# Patient Record
Sex: Male | Born: 1955
Health system: Southern US, Community
[De-identification: ages and names within clinical notes are randomized; demographics above are authoritative.]

## PROBLEM LIST (undated history)

## (undated) DIAGNOSIS — I509 Heart failure, unspecified: Secondary | ICD-10-CM

## (undated) DIAGNOSIS — M109 Gout, unspecified: Secondary | ICD-10-CM

---

## 1998-08-28 ENCOUNTER — Ambulatory Visit (HOSPITAL_BASED_OUTPATIENT_CLINIC_OR_DEPARTMENT_OTHER): Admission: RE | Admit: 1998-08-28 | Discharge: 1998-08-28 | Payer: Self-pay | Admitting: *Deleted

## 2020-01-23 ENCOUNTER — Inpatient Hospital Stay (HOSPITAL_COMMUNITY): Payer: Self-pay

## 2020-01-23 ENCOUNTER — Encounter (HOSPITAL_COMMUNITY): Payer: Self-pay | Admitting: *Deleted

## 2020-01-23 ENCOUNTER — Inpatient Hospital Stay (HOSPITAL_COMMUNITY)
Admission: EM | Admit: 2020-01-23 | Discharge: 2020-02-08 | DRG: 233 | Disposition: A | Discharge status: Home Health | Payer: Self-pay | Attending: Thoracic Surgery (Cardiothoracic Vascular Surgery) | Admitting: Thoracic Surgery (Cardiothoracic Vascular Surgery)

## 2020-01-23 ENCOUNTER — Emergency Department (HOSPITAL_COMMUNITY): Payer: Self-pay

## 2020-01-23 ENCOUNTER — Other Ambulatory Visit: Payer: Self-pay

## 2020-01-23 DIAGNOSIS — L03116 Cellulitis of left lower limb: Secondary | ICD-10-CM | POA: Diagnosis present

## 2020-01-23 DIAGNOSIS — D62 Acute posthemorrhagic anemia: Secondary | ICD-10-CM | POA: Diagnosis not present

## 2020-01-23 DIAGNOSIS — E871 Hypo-osmolality and hyponatremia: Secondary | ICD-10-CM | POA: Diagnosis not present

## 2020-01-23 DIAGNOSIS — J90 Pleural effusion, not elsewhere classified: Secondary | ICD-10-CM

## 2020-01-23 DIAGNOSIS — I5023 Acute on chronic systolic (congestive) heart failure: Secondary | ICD-10-CM

## 2020-01-23 DIAGNOSIS — Z833 Family history of diabetes mellitus: Secondary | ICD-10-CM

## 2020-01-23 DIAGNOSIS — E669 Obesity, unspecified: Secondary | ICD-10-CM | POA: Diagnosis present

## 2020-01-23 DIAGNOSIS — I4891 Unspecified atrial fibrillation: Secondary | ICD-10-CM | POA: Diagnosis not present

## 2020-01-23 DIAGNOSIS — I255 Ischemic cardiomyopathy: Secondary | ICD-10-CM | POA: Diagnosis present

## 2020-01-23 DIAGNOSIS — I4892 Unspecified atrial flutter: Secondary | ICD-10-CM | POA: Diagnosis not present

## 2020-01-23 DIAGNOSIS — Z951 Presence of aortocoronary bypass graft: Secondary | ICD-10-CM

## 2020-01-23 DIAGNOSIS — I2510 Atherosclerotic heart disease of native coronary artery without angina pectoris: Secondary | ICD-10-CM | POA: Diagnosis present

## 2020-01-23 DIAGNOSIS — I472 Ventricular tachycardia: Secondary | ICD-10-CM | POA: Diagnosis not present

## 2020-01-23 DIAGNOSIS — R609 Edema, unspecified: Secondary | ICD-10-CM

## 2020-01-23 DIAGNOSIS — I509 Heart failure, unspecified: Secondary | ICD-10-CM

## 2020-01-23 DIAGNOSIS — I2582 Chronic total occlusion of coronary artery: Secondary | ICD-10-CM | POA: Diagnosis present

## 2020-01-23 DIAGNOSIS — I878 Other specified disorders of veins: Secondary | ICD-10-CM | POA: Diagnosis present

## 2020-01-23 DIAGNOSIS — I110 Hypertensive heart disease with heart failure: Principal | ICD-10-CM | POA: Diagnosis present

## 2020-01-23 DIAGNOSIS — Z6833 Body mass index (BMI) 33.0-33.9, adult: Secondary | ICD-10-CM

## 2020-01-23 DIAGNOSIS — Z09 Encounter for follow-up examination after completed treatment for conditions other than malignant neoplasm: Secondary | ICD-10-CM

## 2020-01-23 DIAGNOSIS — Z95828 Presence of other vascular implants and grafts: Secondary | ICD-10-CM

## 2020-01-23 DIAGNOSIS — E875 Hyperkalemia: Secondary | ICD-10-CM | POA: Diagnosis not present

## 2020-01-23 DIAGNOSIS — R319 Hematuria, unspecified: Secondary | ICD-10-CM | POA: Diagnosis not present

## 2020-01-23 DIAGNOSIS — Z20822 Contact with and (suspected) exposure to covid-19: Secondary | ICD-10-CM | POA: Diagnosis present

## 2020-01-23 DIAGNOSIS — I5021 Acute systolic (congestive) heart failure: Secondary | ICD-10-CM | POA: Diagnosis present

## 2020-01-23 DIAGNOSIS — L03115 Cellulitis of right lower limb: Secondary | ICD-10-CM | POA: Diagnosis present

## 2020-01-23 HISTORY — DX: Gout, unspecified: M10.9

## 2020-01-23 LAB — LIPID PANEL
Cholesterol: 116 mg/dL (ref 0–200)
HDL: 36 mg/dL — ABNORMAL LOW (ref >40)
LDL Cholesterol: 69 mg/dL (ref 0–99)
Total CHOL/HDL Ratio: 3.2 RATIO
Triglycerides: 56 mg/dL (ref <150)
VLDL: 11 mg/dL (ref 0–40)

## 2020-01-23 LAB — CBC
HCT: 44.9 % (ref 39.0–52.0)
Hemoglobin: 14.4 g/dL (ref 13.0–17.0)
MCH: 29.5 pg (ref 26.0–34.0)
MCHC: 32.1 g/dL (ref 30.0–36.0)
MCV: 92 fL (ref 80.0–100.0)
Platelets: 225 10*3/uL (ref 150–400)
RBC: 4.88 MIL/uL (ref 4.22–5.81)
RDW: 16.3 % — ABNORMAL HIGH (ref 11.5–15.5)
WBC: 7.5 10*3/uL (ref 4.0–10.5)
nRBC: 0 % (ref 0.0–0.2)

## 2020-01-23 LAB — HEMOGLOBIN A1C
Hgb A1c MFr Bld: 6.3 % — ABNORMAL HIGH (ref 4.8–5.6)
Mean Plasma Glucose: 134.11 mg/dL

## 2020-01-23 LAB — HEPATIC FUNCTION PANEL
ALT: 15 U/L (ref 0–44)
AST: 31 U/L (ref 15–41)
Albumin: 3.8 g/dL (ref 3.5–5.0)
Alkaline Phosphatase: 99 U/L (ref 38–126)
Bilirubin, Direct: 0.5 mg/dL — ABNORMAL HIGH (ref 0.0–0.2)
Indirect Bilirubin: 0.9 mg/dL (ref 0.3–0.9)
Total Bilirubin: 1.4 mg/dL — ABNORMAL HIGH (ref 0.3–1.2)
Total Protein: 7.8 g/dL (ref 6.5–8.1)

## 2020-01-23 LAB — BASIC METABOLIC PANEL
Anion gap: 10 (ref 5–15)
BUN: 16 mg/dL (ref 8–23)
CO2: 23 mmol/L (ref 22–32)
Calcium: 9.1 mg/dL (ref 8.9–10.3)
Chloride: 102 mmol/L (ref 98–111)
Creatinine, Ser: 1.18 mg/dL (ref 0.61–1.24)
GFR calc Af Amer: >60 mL/min (ref >60)
GFR calc non Af Amer: >60 mL/min (ref >60)
Glucose, Bld: 98 mg/dL (ref 70–99)
Potassium: 4.9 mmol/L (ref 3.5–5.1)
Sodium: 135 mmol/L (ref 135–145)

## 2020-01-23 LAB — TROPONIN I (HIGH SENSITIVITY)
Troponin I (High Sensitivity): 32 ng/L — ABNORMAL HIGH (ref <18)
Troponin I (High Sensitivity): 31 ng/L — ABNORMAL HIGH (ref <18)

## 2020-01-23 LAB — RESPIRATORY PANEL BY RT PCR (FLU A&B, COVID)
Influenza A by PCR: NEGATIVE
Influenza B by PCR: NEGATIVE
SARS Coronavirus 2 by RT PCR: NEGATIVE

## 2020-01-23 LAB — BRAIN NATRIURETIC PEPTIDE: B Natriuretic Peptide: 1370.5 pg/mL — ABNORMAL HIGH (ref 0.0–100.0)

## 2020-01-23 LAB — ECHOCARDIOGRAM COMPLETE

## 2020-01-23 LAB — HIV ANTIBODY (ROUTINE TESTING W REFLEX): HIV Screen 4th Generation wRfx: NONREACTIVE

## 2020-01-23 LAB — SARS CORONAVIRUS 2 (TAT 6-24 HRS): SARS Coronavirus 2: NEGATIVE

## 2020-01-23 LAB — TSH: TSH: 2.785 u[IU]/mL (ref 0.350–4.500)

## 2020-01-23 MED ORDER — FUROSEMIDE 10 MG/ML IJ SOLN
40.0000 mg | Freq: Once | INTRAMUSCULAR | Status: AC
  Administered 2020-01-23: 40 mg via INTRAVENOUS
  Filled 2020-01-23: qty 4

## 2020-01-23 MED ORDER — POLYETHYLENE GLYCOL 3350 17 G PO PACK
17.0000 g | PACK | Freq: Every day | ORAL | Status: DC | PRN
  Administered 2020-01-26: 17 g via ORAL
  Filled 2020-01-23: qty 1

## 2020-01-23 MED ORDER — ACETAMINOPHEN 650 MG RE SUPP: 650.0000 mg | Freq: Four times a day (QID) | RECTAL | Status: DC | PRN

## 2020-01-23 MED ORDER — LOSARTAN POTASSIUM 25 MG PO TABS
12.5000 mg | ORAL_TABLET | Freq: Every day | ORAL | Status: DC
  Filled 2020-01-23: qty 0.5

## 2020-01-23 MED ORDER — ONDANSETRON HCL 4 MG PO TABS: 4.0000 mg | ORAL_TABLET | Freq: Four times a day (QID) | ORAL | Status: DC | PRN

## 2020-01-23 MED ORDER — FUROSEMIDE 10 MG/ML IJ SOLN
40.0000 mg | Freq: Two times a day (BID) | INTRAMUSCULAR | Status: DC
  Administered 2020-01-23 – 2020-01-25 (×5): 40 mg via INTRAVENOUS
  Filled 2020-01-23 (×5): qty 4

## 2020-01-23 MED ORDER — CEPHALEXIN 250 MG PO CAPS
500.0000 mg | ORAL_CAPSULE | Freq: Once | ORAL | Status: AC
  Administered 2020-01-23: 500 mg via ORAL
  Filled 2020-01-23: qty 2

## 2020-01-23 MED ORDER — SPIRONOLACTONE 12.5 MG HALF TABLET
12.5000 mg | ORAL_TABLET | Freq: Every day | ORAL | Status: DC
  Administered 2020-01-23: 12.5 mg via ORAL
  Filled 2020-01-23: qty 1

## 2020-01-23 MED ORDER — CEPHALEXIN 250 MG PO CAPS
500.0000 mg | ORAL_CAPSULE | Freq: Four times a day (QID) | ORAL | Status: DC
  Administered 2020-01-23 – 2020-01-24 (×3): 500 mg via ORAL
  Filled 2020-01-23 (×3): qty 2

## 2020-01-23 MED ORDER — ACETAMINOPHEN 325 MG PO TABS
650.0000 mg | ORAL_TABLET | Freq: Four times a day (QID) | ORAL | Status: DC | PRN
  Administered 2020-01-24: 650 mg via ORAL
  Filled 2020-01-23: qty 2

## 2020-01-23 MED ORDER — DIGOXIN 125 MCG PO TABS
0.1250 mg | ORAL_TABLET | Freq: Every day | ORAL | Status: DC
  Administered 2020-01-23 – 2020-01-29 (×7): 0.125 mg via ORAL
  Filled 2020-01-23 (×7): qty 1

## 2020-01-23 MED ORDER — ENOXAPARIN SODIUM 40 MG/0.4ML ~~LOC~~ SOLN
40.0000 mg | SUBCUTANEOUS | Status: DC
  Administered 2020-01-23 – 2020-01-25 (×3): 40 mg via SUBCUTANEOUS
  Filled 2020-01-23 (×3): qty 0.4

## 2020-01-23 MED ORDER — ONDANSETRON HCL 4 MG/2ML IJ SOLN: 4.0000 mg | Freq: Four times a day (QID) | INTRAMUSCULAR | Status: DC | PRN

## 2020-01-23 NOTE — ED Notes (Signed)
Tele  Amritpal Shropshire brother [phone] looking for an update on pt

## 2020-01-23 NOTE — H&P (Addendum)
Advanced Heart Failure Team History and Physical Note   PCP: None PCP-Cardiology: None    Reason for Admission: Acute CHF    HPI:   Corey Brady is a 64 y.o male who has not seen a doctor since childhood presenting to the ED with progressive BLE of 2-3 months duration. The swelling initially started in his ankles and improved with elevation; however, over the last 2-3 months has progressed up his legs to his lower abdomen. In addition to the swelling he has noticed progressive fatigue and DOE. He is unable to walk more than a block because of pain in his feet (related to the swelling) and shortness of breath. He endorses abdominal distention, early satiety, and orthopnea. As mentioned he has not seen a doctor his he was a child. He has been told his BP was high at the dentist. He does not have a family history of any medical conditions. He does not smoke. He previously drank 2-3 12oz beers per day but has not done so in several months. He does not use recreational drugs. He previously worked at a [employer] but has not worked in several years. He now collects social security. He did have some exertional chest pain a year or two ago but has not had any recently.  Review of Systems: [y] = yes, [ ]  = no   . General: Weight gain [Y]; Weight loss [ ] ; Anorexia [ ] ; Fatigue [Y]; Fever [ ] ; Chills [ ] ; Weakness [ ]   . Cardiac: Chest pain/pressure [ ] ; Resting SOB [ ] ; Exertional SOB [Y]; Orthopnea [Y]; Pedal Edema [Y]; Palpitations [ ] ; Syncope [ ] ; Presyncope [ ] ; Paroxysmal nocturnal dyspnea[ ]   . Pulmonary: Cough [ ] ; Wheezing[ ] ; Hemoptysis[ ] ; Sputum [ ] ; Snoring [ ]   . GI: Vomiting[ ] ; Dysphagia[ ] ; Melena[ ] ; Hematochezia [ ] ; Heartburn[ ] ; Abdominal pain [ ] ; Constipation [ ] ; Diarrhea [ ] ; BRBPR [ ]   . GU: Hematuria[ ] ; Dysuria [ ] ; Nocturia[ ]   . Vascular: Pain in legs with walking [Y]; Pain in feet with lying flat [ ] ; Non-healing sores [ ] ; Stroke [ ] ; TIA [ ] ; Slurred speech [ ] ;   . Neuro: Headaches[ ] ; Vertigo[ ] ; Seizures[ ] ; Paresthesias[ ] ;Blurred vision [ ] ; Diplopia [ ] ; Vision changes [ ]   . Ortho/Skin: Arthritis [ ] ; Joint pain [ ] ; Muscle pain [ ] ; Joint swelling [ ] ; Back Pain [ ] ; Rash [ ]   . Psych: Depression[ ] ; Anxiety[ ]   . Heme: Bleeding problems [ ] ; Clotting disorders [ ] ; Anemia [ ]   . Endocrine: Diabetes [ ] ; Thyroid dysfunction[ ]   Home Medications Denies the use of any prescription or OTC medications.   Past Medical History: Denies any past medical history including HTN, DM, CAD.   Past Surgical History: Denies any past surgical history.   Family History:  Mother: + DM Denies a family history of CAD, CVA, PAD, or HF.   Social History: He does not smoke. He previously drank 2-3 12oz beers per day but has not done so in several months. He does not use recreational drugs. He previously worked at a but has not worked in several years. He now collects social security. Patient currently lives at home with his brother.    Allergies:  Denies any allergies   Objective:    Blood pressure (!) 139/100, pulse (!) 113, temperature 97.6 F (36.4 C), resp. rate 18, SpO2 100 %.  General: Obese male in no  acute distress HENT: Normocephalic, atraumatic, moist mucus membranes Pulm: Good air movement with bibasilar crackles and diminished breath sounds at the right base.  CV: Tachycardic but regular rhythm, PMI displaced laterally, + JVD  Abdomen: Active bowel sounds, firm, distended, pitting edema around the waist  Extremities: Pulses palpable in all extremities, 2-3+ pitting edema to the hips, multiple weeping wounds on the BLE Skin: Warm and dry  Neuro: Alert and oriented x 3  POCUS: Moderately reduced LVEF with dilated right atrium. IVC is > 2 cm and has no respiratory variation.   Telemetry   Sinus tachycardia with occasional PVC  EKG   NSR with LVH and TWI in the anterior leads   Labs     Basic Metabolic Panel: BMP  Latest Ref Rng & Units 01/23/2020  Glucose 70 - 99 mg/dL 98  BUN 8 - 23 mg/dL 16  Creatinine 0.61 - 1.24 mg/dL 1.18  Sodium 135 - 145 mmol/L 135  Potassium 3.5 - 5.1 mmol/L 4.9  Chloride 98 - 111 mmol/L 102  CO2 22 - 32 mmol/L 23  Calcium 8.9 - 10.3 mg/dL 9.1    CBC: CBC Latest Ref Rng & Units 01/23/2020  WBC 4.0 - 10.5 K/uL 7.5  Hemoglobin 13.0 - 17.0 g/dL 14.4  Hematocrit 39.0 - 52.0 % 44.9  Platelets 150 - 400 K/uL 225   BNP: BNP (last 3 results) Recent Labs    01/23/20 0338  BNP 1,370.5*   Imaging:  CXR personally reviewed. Pulmonary edema with right pleural effusion    Patient Profile   Corey Brady is a 64 y.o male who has not seen a doctor since childhood presenting to the ED with signs and symptoms of volume overload and new heart failure. He was subsequently admitted for further evaluation and management.   Assessment/Plan   Acute Heart Failure  - Progressive DOE and LE edema of 2-3 months duration. With pitting edema to the waist, + JVD, elevated BNP and pulmonary edema on CXR  - Continue to diurese with 40 mg IV furosemide BID  - Start Spironolactone 12.5 mg QD  - Obtain formal echocardiogram.   - Check lipid panel and A1c  - Will need ischemic evaluation once closer to euvolemia  - Strict I&O's and daily weights  - Heart healthy diet with fluid restriction  - Continue telemetry for now - Care management consult to assist in medications and insurance   Cellulitis  - Wounds on the LE are due to edema. Some erythema and weeping but otherwise no abscesses appreciated.  - Okay to continue Keflex as inpatient but ultimately needs diureses   Diet: Heart healthy  VTE ppx: Lovenox  CODE STATUS: Full code  Please see attending attestation for final recommendations.   Ina Homes, MD  [facility] PGY3  Pager: [phone]   Advanced Heart Failure Team Pager [phone] (M-F; 7a - 4p)  Please contact [facility] for night-coverage after hours (4p -7a ) and  weekends on [email or web address]  Patient seen and examined with the above-signed Advanced Practice Provider and/or Housestaff. I personally reviewed laboratory data, imaging studies and relevant notes. I independently examined the patient and formulated the important aspects of the plan. I have edited the note to reflect any of my changes or salient points. I have personally discussed the plan with the patient and/or family.  64 y/o male with h/o HTN and moderate ETOH use but no other known significant PMHx. Lives with his brother. Reports that over past 2-3 months he began swelling.  He took some pills from his sister and it got better. However ran out of pills and over past month has had severe, progressive LE edema into thighs. + DOE. Now with limited mobility  In ER found to have anasarca. ECG with sinus tach with LVH. Hstrop negative.    Echo LVEF 15% with severe global HK  Moderate RV dysfunction   On exam General:  Chronically-ill appearing. No resp difficulty HEENT: normal Neck: supple.JVP to ear  Carotids 2+ bilat; no bruits. No lymphadenopathy or thryomegaly appreciated. Cor: PMI nondisplaced. Regular tachy +s3 Lungs: clear Abdomen: soft, nontender, ++ distended. No hepatosplenomegaly. No bruits or masses. Good bowel sounds. Extremities: no cyanosis, clubbing, rash,3-4+ edema with weeping lesions and probable cellulitis Neuro: alert & orientedx3, cranial nerves grossly intact. moves all 4 extremities w/o difficulty. Affect pleasant  He has acute systolic HF with severe biventricular dysfunction and massive volume overload. I suspect this is related to longstanding untreated HTN. Will start diuresis. Add spiro and dig and low-dose losartan. Will need wound care. Cover for cellulitis. Eventual R/L cath.   Will need SW to see as he has no insurance.   Arvilla Meres, MD  6:06 PM

## 2020-01-23 NOTE — Progress Notes (Signed)
  Echocardiogram 2D Echocardiogram has been performed.  Corey Brady 01/23/2020, 4:48 PM

## 2020-01-23 NOTE — Progress Notes (Signed)
[facility] Program Questions  Home support: Does the patient have a caregiver? Lives with his brother.   Is the caregiver physically and cognitively able to and willing to assist in the patient's care at home? Yes  Are there aspects of care that the caregiver is unable or willing to assist with?   Yes   Prior Functional Level: Has there been a change from their baseline level of function with ADLs and IADLs?  Yes  If yes, describe the change in functional status.   Legs swollen making it a little harder to ambulate  Does the change in functional status limit the patient from returning home for their care? No  Falls:   Has the patient had any falls with injury in the last 6 months?   No  If yes, how many and what was the cause of the fall(s)? n/a  Transportation: Does the patient drive?  Yes  Is a caregiver able to pick up needed supplies (groceries, medications, etc.)? Yes; pt reports most of the time his brother picks up groceries.   If both answers above are "no" has the patient been using delivery services to get their needed supplies? N/a    How does the patient get to medical appointments?  He drives himself or his brother takes him.   *Not currently active with any HHPT and HHOT.     Farley Ly, PT, DPT  Acute Rehabilitation Services  Pager: [phone] Office: [phone]

## 2020-01-23 NOTE — ED Notes (Signed)
Report given to 6 east.

## 2020-01-23 NOTE — ED Triage Notes (Signed)
Pt reports ongoing bilateral ankle and foot swelling that has increased up into his legs. No acute distress is noted at triage.

## 2020-01-23 NOTE — ED Provider Notes (Signed)
[facility]EMERGENCY DEPT [facility] Emergency Department Provider Note MRN:  [id number]  Arrival date & time: 01/23/20     Chief Complaint   Leg Swelling   History of Present Illness   Corey Brady is a 64 y.o. year-old male with a history of gout presenting to the ED with chief complaint of leg swelling.  Months of slowly worsening swelling to the bilateral legs.  Has never had a primary care doctor before.  Does not take any medications.  Has noted some occasional shortness of breath when laying flat at night.  Denies any chest pain, no fever, no cough, no abdominal pain.  Review of Systems  A complete 10 system review of systems was obtained and all systems are negative except as noted in the HPI and PMH.   Patient's Health History    Past Medical History:  Diagnosis Date  . Gout     History reviewed. No pertinent surgical history.  History reviewed. No pertinent family history.  Social History   Socioeconomic History  . Marital status: Single    Spouse name: Not on file  . Number of children: Not on file  . Years of education: Not on file  . Highest education level: Not on file  Occupational History  . Not on file  Tobacco Use  . Smoking status: Never Smoker  Substance and Sexual Activity  . Alcohol use: Yes  . Drug use: Not on file  . Sexual activity: Not on file  Other Topics Concern  . Not on file  Social History Narrative  . Not on file   Social Determinants of Health   Financial Resource Strain:   . Difficulty of Paying Living Expenses:   Food Insecurity:   . Worried About Programme researcher, broadcasting/film/video in the Last Year:   . Barista in the Last Year:   Transportation Needs:   . Freight forwarder (Medical):   Marland Kitchen Lack of Transportation (Non-Medical):   Physical Activity:   . Days of Exercise per Week:   . Minutes of Exercise per Session:   Stress:   . Feeling of Stress :   Social Connections:   . Frequency of Communication with Friends and Family:    . Frequency of Social Gatherings with Friends and Family:   . Attends Religious Services:   . Active Member of Clubs or Organizations:   . Attends Banker Meetings:   Marland Kitchen Marital Status:   Intimate Partner Violence:   . Fear of Current or Ex-Partner:   . Emotionally Abused:   Marland Kitchen Physically Abused:   . Sexually Abused:      Physical Exam   Vitals:   01/23/20 1200 01/23/20 1300  BP: (!) 133/91 (!) 133/91  Pulse: 96 97  Resp:  (!) 21  Temp:    SpO2: 99% 99%    CONSTITUTIONAL: Chronically ill-appearing, NAD NEURO:  Alert and oriented x 3, no focal deficits EYES:  eyes equal and reactive ENT/NECK:  no LAD, no JVD CARDIO: Tachycardic rate, well-perfused, normal S1 and S2 PULM:  CTAB no wheezing or rhonchi GI/GU:  normal bowel sounds, non-distended, non-tender MSK/SPINE:  No gross deformities, no edema SKIN: Pitting edema to the bilateral legs to the level of the thigh, chronic erythematous and flaky skin changes bilaterally, bright red erythema noted to the right tib-fib with malodorous nature PSYCH:  Appropriate speech and behavior  *Additional and/or pertinent findings included in MDM below  Diagnostic and Interventional Summary  EKG Interpretation  Date/Time:  Tuesday January 23 2020 12:22:18 EDT Ventricular Rate:  100 PR Interval:    QRS Duration: 110 QT Interval:  370 QTC Calculation: 478 R Axis:   -12 Text Interpretation: Sinus tachycardia LVH with secondary repolarization abnormality Borderline prolonged QT interval No previous ECGs available Confirmed by Gerlene Fee [phone]) on 01/23/2020 1:04:37 PM      Labs Reviewed  CBC - Abnormal; Notable for the following components:      Result Value   RDW 16.3 (*)    All other components within normal limits  BRAIN NATRIURETIC PEPTIDE - Abnormal; Notable for the following components:   B Natriuretic Peptide 1,370.5 (*)    All other components within normal limits  HEPATIC FUNCTION PANEL - Abnormal;  Notable for the following components:   Total Bilirubin 1.4 (*)    Bilirubin, Direct 0.5 (*)    All other components within normal limits  TROPONIN I (HIGH SENSITIVITY) - Abnormal; Notable for the following components:   Troponin I (High Sensitivity) 32 (*)    All other components within normal limits  BASIC METABOLIC PANEL  TROPONIN I (HIGH SENSITIVITY)    DG Chest Port 1 View  Final Result      Medications  cephALEXin (KEFLEX) capsule 500 mg (500 mg Oral Given 01/23/20 1023)  furosemide (LASIX) injection 40 mg (40 mg Intravenous Given 01/23/20 1404)     Procedures  /  Critical Care Procedures  ED Course and Medical Decision Making  I have reviewed the triage vital signs, the nursing notes, and pertinent available records from the EMR.  Listed above are laboratory and imaging tests that I personally ordered, reviewed, and interpreted and then considered in my medical decision making (see below for details).      Chronically worsening lower extremity edema, unclear etiology, considering cardiogenic, hepatic, renal, venous.  Other than mild tachycardia patient is in no respiratory distress with reassuring vital signs, will screen with x-ray, laboratory assessment.  I suspect a large component of his lower extremity skin changes are chronic and related to the edema but there does appear to be a more bright patch of erythema concerning for cellulitis, I do not see fluctuance or signs of abscess or deeper space infection.  Work-up is pending.  Work-up reveals elevated BNP, raising concern for CHF as the cause of his swelling.  Patient was evaluated by Dr. Tarri Abernethy of cardiology for the possibility of home management of this CHF exacerbation.  This is somewhat difficult given that patient has had no diagnostics related to heart failure in the past and has no follow-up established.  He did have some good response to Lasix here in the emergency department.  Upon further questioning patient had  some intermittent chest pain prior to the fluid retention.  Patient is to be admitted to the cardiology service for further management and possible catheterization.  Barth Kirks. [place] Small, [place] [email or web address]  Final Clinical Impressions(s) / ED Diagnoses     ICD-10-CM   1. Peripheral edema  R60.9   2. Congestive heart failure, unspecified HF chronicity, unspecified heart failure type ([place])  I50.9     ED Discharge Orders    None       Discharge Instructions Discussed with and Provided to Patient:   Discharge Instructions   None       Maudie Flakes, MD 01/23/20 1458

## 2020-01-23 NOTE — Evaluation (Signed)
Physical Therapy Evaluation Patient Details Name: Corey Brady MRN: [id number] DOB: 01-07-1956 Today's Date: 01/23/2020   History of Present Illness  Pt is a 64 y/o male admitted secondary to Increased swelling in BLE; likely from CHF exacerbation. PMH includes CHF and gout.   Clinical Impression  Pt admitted secondary to problem above with deficits below. Pt requiring min guard to supervision for mobility tasks. Reports some tightness in BLE, however, did not seem to affect mobility. Educated about using RW when having heel pain vs crutches, however, pt refusing RW at this time. Plan is to return home with his brother. Will continue to follow acutely.     Follow Up Recommendations No PT follow up    Equipment Recommendations  None recommended by PT(pt refusing RW )    Recommendations for Other Services       Precautions / Restrictions Precautions Precautions: Fall Restrictions Weight Bearing Restrictions: No      Mobility  Bed Mobility Overal bed mobility: Needs Assistance Bed Mobility: Sit to Supine;Supine to Sit     Supine to sit: Min guard Sit to supine: Supervision   General bed mobility comments: Supervision to min guard for safety during bed mobility.   Transfers Overall transfer level: Needs assistance Equipment used: None Transfers: Sit to/from Stand Sit to Stand: Supervision         General transfer comment: Supervision for safety.   Ambulation/Gait Ambulation/Gait assistance: Supervision Gait Distance (Feet): 20 Feet Assistive device: None Gait Pattern/deviations: Step-through pattern;Decreased stride length Gait velocity: Decreased   General Gait Details: Waddle type gait within the room in the ED. No LOB noted. Pt reports he is close to his baseline, other than tightness in BLE.   Stairs            Wheelchair Mobility    Modified Rankin (Stroke Patients Only)       Balance Overall balance assessment: Mild deficits observed, not  formally tested                                           Pertinent Vitals/Pain Pain Assessment: Faces Faces Pain Scale: Hurts a little bit Pain Location: BLE Pain Descriptors / Indicators: Grimacing;Guarding Pain Intervention(s): Monitored during session;Limited activity within patient's tolerance;Repositioned    Home Living Family/patient expects to be discharged to:: Private residence Living Arrangements: Other relatives(brother) Available Help at Discharge: Family;Available PRN/intermittently Type of Home: House Home Access: Stairs to enter Entrance Stairs-Rails: Doctor, general practice of Steps: 6 Home Layout: One level Home Equipment: Crutches;Cane - single point Additional Comments: Brother works 2nd shift     Prior Function Level of Independence: Independent with assistive device(s)         Comments: Sometimes use crutches     Hand Dominance        Extremity/Trunk Assessment   Upper Extremity Assessment Upper Extremity Assessment: Overall WFL for tasks assessed    Lower Extremity Assessment Lower Extremity Assessment: RLE deficits/detail;LLE deficits/detail RLE Deficits / Details: Increased swelling, redness, and blistering noted in BLE.  LLE Deficits / Details: Increased swelling, redness, and blistering noted in BLE.     Cervical / Trunk Assessment Cervical / Trunk Assessment: Normal  Communication   Communication: No difficulties  Cognition Arousal/Alertness: Awake/alert Behavior During Therapy: WFL for tasks assessed/performed Overall Cognitive Status: Within Functional Limits for tasks assessed  General Comments General comments (skin integrity, edema, etc.): Pt reports he sometimes has to use crutches for heel pain. Educated about RW for increased safety with heel pain, however, pt refusing at this time.     Exercises     Assessment/Plan    PT Assessment  Patient needs continued PT services  PT Problem List Decreased strength;Decreased mobility;Cardiopulmonary status limiting activity       PT Treatment Interventions Gait training;Stair training;Functional mobility training;Therapeutic activities;Therapeutic exercise;Balance training;Patient/family education    PT Goals (Current goals can be found in the Care Plan section)  Acute Rehab PT Goals Patient Stated Goal: to go home PT Goal Formulation: With patient Time For Goal Achievement: 02/06/20 Potential to Achieve Goals: Good    Frequency Min 3X/week   Barriers to discharge        Co-evaluation               AM-PAC PT "6 Clicks" Mobility  Outcome Measure Help needed turning from your back to your side while in a flat bed without using bedrails?: None Help needed moving from lying on your back to sitting on the side of a flat bed without using bedrails?: A Little Help needed moving to and from a bed to a chair (including a wheelchair)?: None Help needed standing up from a chair using your arms (e.g., wheelchair or bedside chair)?: None Help needed to walk in hospital room?: None Help needed climbing 3-5 steps with a railing? : A Little 6 Click Score: 22    End of Session   Activity Tolerance: Patient tolerated treatment well Patient left: in bed;with call bell/phone within reach Nurse Communication: Mobility status PT Visit Diagnosis: Other abnormalities of gait and mobility (R26.89)    Time: 3888-2800 PT Time Calculation (min) (ACUTE ONLY): 14 min   Charges:   PT Evaluation $PT Eval Low Complexity: 1 Low          Lou Miner, DPT  Acute Rehabilitation Services  Pager: [phone] Office: [phone]   Rudean Hitt 01/23/2020, 2:59 PM

## 2020-01-24 LAB — CBC
HCT: 44.3 % (ref 39.0–52.0)
Hemoglobin: 15 g/dL (ref 13.0–17.0)
MCH: 30.2 pg (ref 26.0–34.0)
MCHC: 33.9 g/dL (ref 30.0–36.0)
MCV: 89.1 fL (ref 80.0–100.0)
Platelets: 164 10*3/uL (ref 150–400)
RBC: 4.97 MIL/uL (ref 4.22–5.81)
RDW: 15.9 % — ABNORMAL HIGH (ref 11.5–15.5)
WBC: 6 10*3/uL (ref 4.0–10.5)
nRBC: 0 % (ref 0.0–0.2)

## 2020-01-24 LAB — BASIC METABOLIC PANEL
Anion gap: 11 (ref 5–15)
BUN: 15 mg/dL (ref 8–23)
CO2: 23 mmol/L (ref 22–32)
Calcium: 8.9 mg/dL (ref 8.9–10.3)
Chloride: 101 mmol/L (ref 98–111)
Creatinine, Ser: 0.91 mg/dL (ref 0.61–1.24)
GFR calc Af Amer: >60 mL/min (ref >60)
GFR calc non Af Amer: >60 mL/min (ref >60)
Glucose, Bld: 98 mg/dL (ref 70–99)
Potassium: 4.5 mmol/L (ref 3.5–5.1)
Sodium: 135 mmol/L (ref 135–145)

## 2020-01-24 MED ORDER — SODIUM CHLORIDE 0.9% FLUSH
3.0000 mL | Freq: Two times a day (BID) | INTRAVENOUS | Status: DC
  Administered 2020-01-24 – 2020-01-29 (×7): 3 mL via INTRAVENOUS

## 2020-01-24 MED ORDER — SPIRONOLACTONE 25 MG PO TABS
25.0000 mg | ORAL_TABLET | Freq: Every day | ORAL | Status: DC
  Administered 2020-01-24 – 2020-01-26 (×3): 25 mg via ORAL
  Filled 2020-01-24 (×4): qty 1

## 2020-01-24 MED ORDER — VANCOMYCIN HCL 2000 MG/400ML IV SOLN
2000.0000 mg | INTRAVENOUS | Status: DC
  Administered 2020-01-24 – 2020-01-28 (×5): 2000 mg via INTRAVENOUS
  Filled 2020-01-24 (×5): qty 400

## 2020-01-24 MED ORDER — SODIUM CHLORIDE 0.9 % IV SOLN
INTRAVENOUS | Status: DC | PRN
  Administered 2020-01-24: 250 mL via INTRAVENOUS

## 2020-01-24 MED ORDER — LOSARTAN POTASSIUM 25 MG PO TABS
25.0000 mg | ORAL_TABLET | Freq: Every day | ORAL | Status: DC
  Administered 2020-01-24 – 2020-01-29 (×6): 25 mg via ORAL
  Filled 2020-01-24 (×6): qty 1

## 2020-01-24 NOTE — Progress Notes (Signed)
To the best of my knowledge, the student's charting is accurate.  

## 2020-01-24 NOTE — Progress Notes (Addendum)
Advanced Heart Failure Rounding Note  PCP-Cardiologist: No primary care provider on file.   Subjective:     Yesterday diuresed with IV lasix. Brisk diuresis noted.   Feeling a little better. Complaining of leg pain.    Objective:   Weight Range: 112.5 kg Body mass index is 33.65 kg/m.   Vital Signs:   Temp:  [97.6 F (36.4 C)-98.5 F (36.9 C)] 98.5 F (36.9 C) (04/28 0728) Pulse Rate:  [96-113] 96 (04/28 0728) Resp:  [18-21] 20 (04/28 0728) BP: (109-139)/(60-100) 110/60 (04/28 0728) SpO2:  [95 %-100 %] 96 % (04/28 0728) Weight:  [112.5 kg-114.3 kg] 112.5 kg (04/28 0321)    Weight change: Filed Weights   01/23/20 2018 01/24/20 0321  Weight: 114.3 kg 112.5 kg    Intake/Output:   Intake/Output Summary (Last 24 hours) at 01/24/2020 0937 Last data filed at 01/24/2020 0800 Gross per 24 hour  Intake 590 ml  Output 6200 ml  Net -5610 ml      Physical Exam    General:   No resp difficulty HEENT: Normal Neck: Supple. JVP to jaw . Carotids 2+ bilat; no bruits. No lymphadenopathy or thyromegaly appreciated. Cor: PMI nondisplaced. Regular rate & rhythm. No rubs, gallops or murmurs. Lungs: Clear Abdomen: Soft, nontender, nondistended. No hepatosplenomegaly. No bruits or masses. Good bowel sounds. Extremities: No cyanosis, clubbing, rash, R and LLE erythema with copious serous exudate. Able to palpate R and L pedal pulses.  Neuro: Alert & orientedx3, cranial nerves grossly intact. moves all 4 extremities w/o difficulty. Affect pleasant   Telemetry  SR/ST 90-100s   EKG    n/a  Labs    CBC Recent Labs    01/23/20 0338 01/24/20 0711  WBC 7.5 6.0  HGB 14.4 15.0  HCT 44.9 44.3  MCV 92.0 89.1  PLT 225 401   Basic Metabolic Panel Recent Labs    01/23/20 0338 01/24/20 0711  NA 135 135  K 4.9 4.5  CL 102 101  CO2 23 23  GLUCOSE 98 98  BUN 16 15  CREATININE 1.18 0.91  CALCIUM 9.1 8.9   Liver Function Tests Recent Labs    01/23/20 0338  AST  31  ALT 15  ALKPHOS 99  BILITOT 1.4*  PROT 7.8  ALBUMIN 3.8   No results for input(s): LIPASE, AMYLASE in the last 72 hours. Cardiac Enzymes No results for input(s): CKTOTAL, CKMB, CKMBINDEX, TROPONINI in the last 72 hours.  BNP: BNP (last 3 results) Recent Labs    01/23/20 0338  BNP 1,370.5*    ProBNP (last 3 results) No results for input(s): PROBNP in the last 8760 hours.   D-Dimer No results for input(s): DDIMER in the last 72 hours. Hemoglobin A1C Recent Labs    01/23/20 1700  HGBA1C 6.3*   Fasting Lipid Panel Recent Labs    01/23/20 1700  CHOL 116  HDL 36*  LDLCALC 69  TRIG 56  CHOLHDL 3.2   Thyroid Function Tests Recent Labs    01/23/20 1700  TSH 2.785    Other results:   Imaging    DG Chest Port 1 View  Result Date: 01/23/2020 CLINICAL DATA:  64 year old male with lower extremity edema. Denies chest complaints. EXAM: PORTABLE CHEST 1 VIEW COMPARISON:  None. FINDINGS: Portable AP upright view at 1014 hours. Cardiomegaly. Other mediastinal contours are within normal limits. Visualized tracheal air column is within normal limits. Confluent opacity at the right lung base compatible with small to moderate pleural effusion. No left  pleural effusion. Pulmonary vascularity is at the upper limits of normal, no overt edema. No pneumothorax or air bronchograms. Paucity of bowel gas in the upper abdomen. No acute osseous abnormality identified. IMPRESSION: Cardiomegaly with right lung base opacity compatible with small to moderate pleural effusion. No overt edema. Electronically Signed   By: Odessa Fleming M.D.   On: 01/23/2020 10:24   ECHOCARDIOGRAM COMPLETE  Result Date: 01/23/2020    ECHOCARDIOGRAM REPORT   Patient Name:   Corey Brady Date of Exam: 01/23/2020 Medical Rec #:  [id number]  Height:       72.0 in Accession #:    [id number] Weight:       268.0 lb Date of Birth:  10-15-1955 BSA:          2.413 m Patient Age:    63 years   BP:           133/91 mmHg Patient  Gender: M          HR:           99 bpm. Exam Location:  Inpatient Procedure: 2D Echo, Cardiac Doppler and Color Doppler Indications:    I50.23 Acute on chronic systolic (congestive) heart failure  History:        Patient has no prior history of Echocardiogram examinations.  Sonographer:    Tiffany Dance Referring Phys: [id number] Elmer Sow BERO IMPRESSIONS  1. Severe global reduction in LV systolic function; restrictive filling; mild LVE; biatrial enlargement; moderate RV dysfunction; mild MR.  2. Left ventricular ejection fraction, by estimation, is <20%. The left ventricle has severely decreased function. The left ventricle demonstrates global hypokinesis. The left ventricular internal cavity size was mildly dilated. Left ventricular diastolic parameters are consistent with Grade III diastolic dysfunction (restrictive).  3. Right ventricular systolic function is moderately reduced. The right ventricular size is normal. There is moderately elevated pulmonary artery systolic pressure.  4. Left atrial size was severely dilated.  5. Right atrial size was mildly dilated.  6. The mitral valve is normal in structure. Mild mitral valve regurgitation. No evidence of mitral stenosis.  7. The aortic valve is tricuspid. Aortic valve regurgitation is not visualized. Mild aortic valve sclerosis is present, with no evidence of aortic valve stenosis.  8. The inferior vena cava is dilated in size with <50% respiratory variability, suggesting right atrial pressure of 15 mmHg. FINDINGS  Left Ventricle: Left ventricular ejection fraction, by estimation, is <20%. The left ventricle has severely decreased function. The left ventricle demonstrates global hypokinesis. The left ventricular internal cavity size was mildly dilated. There is no  left ventricular hypertrophy. Left ventricular diastolic parameters are consistent with Grade III diastolic dysfunction (restrictive). Right Ventricle: The right ventricular size is normal.Right  ventricular systolic function is moderately reduced. There is moderately elevated pulmonary artery systolic pressure. The tricuspid regurgitant velocity is 3.03 m/s, and with an assumed right atrial pressure of 15 mmHg, the estimated right ventricular systolic pressure is 51.7 mmHg. Left Atrium: Left atrial size was severely dilated. Right Atrium: Right atrial size was mildly dilated. Pericardium: Trivial pericardial effusion is present. Mitral Valve: The mitral valve is normal in structure. Normal mobility of the mitral valve leaflets. Mild mitral valve regurgitation. No evidence of mitral valve stenosis. Tricuspid Valve: The tricuspid valve is normal in structure. Tricuspid valve regurgitation is mild . No evidence of tricuspid stenosis. Aortic Valve: The aortic valve is tricuspid. Aortic valve regurgitation is not visualized. Mild aortic valve sclerosis is present, with no evidence of aortic  valve stenosis. Pulmonic Valve: The pulmonic valve was normal in structure. Pulmonic valve regurgitation is trivial. No evidence of pulmonic stenosis. Aorta: The aortic root is normal in size and structure. Venous: The inferior vena cava is dilated in size with less than 50% respiratory variability, suggesting right atrial pressure of 15 mmHg. IAS/Shunts: There is left bowing of the interatrial septum, suggestive of elevated right atrial pressure. No atrial level shunt detected by color flow Doppler. Additional Comments: Severe global reduction in LV systolic function; restrictive filling; mild LVE; biatrial enlargement; moderate RV dysfunction; mild MR.  LEFT VENTRICLE PLAX 2D LVIDd:         5.47 cm  Diastology LVIDs:         4.76 cm  LV e' lateral:   3.65 cm/s LV PW:         1.34 cm  LV E/e' lateral: 24.2 LV IVS:        1.07 cm  LV e' medial:    2.68 cm/s LVOT diam:     2.40 cm  LV E/e' medial:  32.9 LV SV:         52 LV SV Index:   22 LVOT Area:     4.52 cm  RIGHT VENTRICLE            IVC RV Basal diam:  3.73 cm    IVC  diam: 2.74 cm RV Mid diam:    2.88 cm RV S prime:     6.41 cm/s TAPSE (M-mode): 1.3 cm LEFT ATRIUM              Index       RIGHT ATRIUM           Index LA diam:        5.20 cm  2.16 cm/m  RA Area:     24.00 cm LA Vol (A2C):   150.0 ml 62.17 ml/m RA Volume:   81.10 ml  33.62 ml/m LA Vol (A4C):   105.0 ml 43.52 ml/m LA Biplane Vol: 127.0 ml 52.64 ml/m  AORTIC VALVE LVOT Vmax:   70.20 cm/s LVOT Vmean:  43.600 cm/s LVOT VTI:    0.116 m  AORTA Ao Root diam: 3.80 cm Ao Asc diam:  3.60 cm MITRAL VALVE               TRICUSPID VALVE MV Area (PHT): 5.25 cm    TR Peak grad:   36.7 mmHg MV Decel Time: 145 msec    TR Vmax:        303.00 cm/s MV E velocity: 88.25 cm/s                            SHUNTS                            Systemic VTI:  0.12 m                            Systemic Diam: 2.40 cm Olga Millers MD Electronically signed by Olga Millers MD Signature Date/Time: 01/23/2020/4:54:36 PM    Final       Medications:     Scheduled Medications: . digoxin  0.125 mg Oral Daily  . enoxaparin (LOVENOX) injection  40 mg Subcutaneous Q24H  . furosemide  40 mg Intravenous BID  . losartan  25 mg Oral Daily  . spironolactone  25 mg Oral Daily     Infusions: . vancomycin       PRN Medications:  acetaminophen **OR** acetaminophen, ondansetron **OR** ondansetron (ZOFRAN) IV, polyethylene glycol     Assessment/Plan   1. Acute Systolic Heart Failure ECHO EF 15% with severe global HK, and moderate RV dysfunction. ? Etiology possible ETOH versus HTN. TSH was ok. HIV NR  - Brisk diuresis noted with IV lasix. Will continue 40 mg IV lasix twice a day for now. Renal function stable.  - Continue spiro 25 mg daily - Continue digoxin 0.125 mg daily  - Increased losartan 25 mg daily eventually will need entresto  - Once diuresed will need RHC/LHC   2. Cellulits , R and LLE legs Partial Thickness wound with palpable pedal pulses.  WBC ok.  Started keflex 4/27-->Switch to vancomycin today - WOC  consult.   3. ? H/O ETOH Abuse  Social - He has not insurance so we will need to provide 30 days of HF meds at discharge. Will notiffy HF Pharmacy and HF SW for med assist. Suspect he will need HF Paramedicine.   He will also need to be set up with PCP.   Length of Stay: 1  Amy Clegg, NP  01/24/2020, 9:37 AM  Advanced Heart Failure Team Pager [phone] (M-F; 7a - 4p)  Please contact [facility] for night-coverage after hours (4p -7a ) and weekends on [email or web address]  Patient seen and examined with the above-signed Advanced Practice Provider and/or Housestaff. I personally reviewed laboratory data, imaging studies and relevant notes. I independently examined the patient and formulated the important aspects of the plan. I have edited the note to reflect any of my changes or salient points. I have personally discussed the plan with the patient and/or family.  Diuresed well overnight. Dyspnea better buts till weak. Legs continue to weep. No f/c. HR slightly improved. BP toelrating diuresis and HF meds. Renal function stable.   General:  Chronically ill appearing. No resp difficulty HEENT: normal Neck: supple. JVP 10-11 with prominent v wavesCarotids 2+ bilat; no bruits. No lymphadenopathy or thryomegaly appreciated. Cor: PMI nondisplaced. Regular rate & rhythm. 2/6 TR +s3 Lungs: clear Abdomen: soft, nontender, less distended. No hepatosplenomegaly. No bruits or masses. Good bowel sounds. Extremities: no cyanosis, clubbing, rash, 3+ edema diffuse erythema and weeping wounds Neuro: alert & orientedx3, cranial nerves grossly intact. moves all 4 extremities w/o difficulty. Affect pleasant  Continue IV diuresis and titration of HF meds. Discussed dosing with PharmD personally. Appreciate wound care input. Will use gentle wraps of legs. Distal pulses ok. Suspect this is mostly venous stasis. Will give IV vanc for a few days for possible overlying cellulitis. Plan R/L cath likely Friday.   Arvilla Meres, MD  10:58 AM

## 2020-01-24 NOTE — Progress Notes (Signed)
   01/24/20 1510 01/24/20 1535 01/24/20 1537  Assess: MEWS Score  Temp 98.8 F (37.1 C) 98 F (36.7 C) 98 F (36.7 C)  BP 109/65 103/67 103/67  Pulse Rate (!) 111 (documenting error at first) (!) 106 (!) 107  Resp 19 18 18   Level of Consciousness Alert Alert  --   SpO2 98 % 97 % 96 %  O2 Device Room Air Room Air Room Air  Assess: MEWS Score  MEWS Temp 0 0 0  MEWS Systolic 0 0 0  MEWS Pulse 2 1 1   MEWS RR 0 0 0  MEWS LOC 0 0 0  MEWS Score 2 1 1   MEWS Score Color Yellow Green Green  Assess: if the MEWS score is Yellow or Red  Were vital signs taken at a resting state? Yes  --   --   Focused Assessment Documented focused assessment  --   --   Early Detection of Sepsis Score *See Row Information* Low  --   --   MEWS guidelines implemented *See Row Information* No, vital signs rechecked  --   --    Apical pulse 106.pt denies chest pain or shortness of breath.

## 2020-01-24 NOTE — Progress Notes (Signed)
Pharmacy Antibiotic Note  Corey Brady is a 64 y.o. male admitted on 01/23/2020 with cellulitis.  Pharmacy has been consulted for vancomycin dosing.  Pt has bilateral red cellulitis appearance on legs. WBC wnl. Afebrile. Started on Keflex on 4/27. Scr 0.9 (norm CrCl 85).   Plan: Start vancomycin 2g IV q24h (estimated AUC 443 using SCr 0.9)  Monitor Scr, culture results, physical appearance.   Height: 6' (182.9 cm) Weight: 112.5 kg (248 lb 1.6 oz) IBW/kg (Calculated) : 77.6  Temp (24hrs), Avg:97.8 F (36.6 C), Min:97.6 F (36.4 C), Max:98.5 F (36.9 C)  Recent Labs  Lab 01/23/20 0338 01/24/20 0711  WBC 7.5 6.0  CREATININE 1.18 0.91    Estimated Creatinine Clearance: 107.6 mL/min (by C-G formula based on SCr of 0.91 mg/dL).    No Known Allergies  Antimicrobials this admission: Keflex 4/27 >> 4/28 Vancomycin 4/28 >>   Dose adjustments this admission: N/a   Microbiology results: 4/27 COVID PCR neg   Thank you for allowing pharmacy to be a part of this patient's care.   Budd Palmer  PharmD candidate

## 2020-01-24 NOTE — Consult Note (Signed)
WOC Nurse Consult Note: Patient receiving care in [facility] 3E29. Reason for Consult: BLE wounds Wound type: related to excess fluid from feet to abdomen Pressure Injury POA: Yes/No/NA Measurement: RLE has a superficial wound measuring 3.3 cm x 3 cm.  The LLE posterior has a wound that measures 11 cm x 3 cm and another above it and below the posterior knee that measures 3 cm x 3 cm.  All are superficial and legs are weeping yellow fluid. Wound bed: all are pink Drainage (amount, consistency, odor) yellow, thin Periwound: crusted, flaking Dressing procedure/placement/frequency:  Gently wash BLE with soap and water, pat dry. Place as many Xeroform gauze [place] [phone]) over the LE wounds as necessary to cover. If weeping, also place ABD pads.  Beginning just behind the toes and going to just below the knees, spiral wrap kerlex and 4 inch Ace Wraps.  Change daily and prn soilage. If an ABI reveals adequate pressures to support the use of compression wraps, this can be considered.  Currently there are no results to review.  Also, the patient has never used any type of compression on the legs before, so we are unsure if his currently fluid overload condition will tolerate a true compression wrap to the legs. Monitor the wound area(s) for worsening of condition such as: Signs/symptoms of infection,  Increase in size,  Development of or worsening of odor, Development of pain, or increased pain at the affected locations.  Notify the medical team if any of these develop.  Thank you for the consult.  Discussed plan of care with the patient and bedside nurse.  WOC nurse will not follow at this time.  Please re-consult the WOC team if needed.  Helmut Muster, RN, MSN, CWOCN, CNS-BC, pager [phone]

## 2020-01-24 NOTE — Progress Notes (Addendum)
   01/24/20 1200 01/24/20 1224  Assess: MEWS Score  Temp 98.3 F (36.8 C) 98 F (36.7 C)  BP 100/68 101/72  Pulse Rate (!) 106 (!) 108  Resp 20 18  SpO2 97 % 96 %  O2 Device Room Air Room Air  Assess: MEWS Score  MEWS Temp 0 0  MEWS Systolic 1 0  MEWS Pulse 1 1  MEWS RR 0 0  MEWS LOC 0 0  MEWS Score 2 1  MEWS Score Color Yellow Green  Assess: if the MEWS score is Yellow or Red  Were vital signs taken at a resting state? Yes Yes  Focused Assessment Documented focused assessment Documented focused assessment  Early Detection of Sepsis Score *See Row Information* Low Low  MEWS guidelines implemented *See Row Information* No, vital signs rechecked No, vital signs rechecked   Apical pulse 108. Pt denies shortness of breath or chest pain.

## 2020-01-25 LAB — MAGNESIUM: Magnesium: 1.8 mg/dL (ref 1.7–2.4)

## 2020-01-25 LAB — CBC
HCT: 42.2 % (ref 39.0–52.0)
Hemoglobin: 14 g/dL (ref 13.0–17.0)
MCH: 29.8 pg (ref 26.0–34.0)
MCHC: 33.2 g/dL (ref 30.0–36.0)
MCV: 89.8 fL (ref 80.0–100.0)
Platelets: 193 10*3/uL (ref 150–400)
RBC: 4.7 MIL/uL (ref 4.22–5.81)
RDW: 15.9 % — ABNORMAL HIGH (ref 11.5–15.5)
WBC: 6.2 10*3/uL (ref 4.0–10.5)
nRBC: 0 % (ref 0.0–0.2)

## 2020-01-25 LAB — BASIC METABOLIC PANEL
Anion gap: 10 (ref 5–15)
BUN: 13 mg/dL (ref 8–23)
CO2: 27 mmol/L (ref 22–32)
Calcium: 8.8 mg/dL — ABNORMAL LOW (ref 8.9–10.3)
Chloride: 101 mmol/L (ref 98–111)
Creatinine, Ser: 0.91 mg/dL (ref 0.61–1.24)
GFR calc Af Amer: >60 mL/min (ref >60)
GFR calc non Af Amer: >60 mL/min (ref >60)
Glucose, Bld: 95 mg/dL (ref 70–99)
Potassium: 3.8 mmol/L (ref 3.5–5.1)
Sodium: 138 mmol/L (ref 135–145)

## 2020-01-25 MED ORDER — SODIUM CHLORIDE 0.9 % IV SOLN: 250.0000 mL | INTRAVENOUS | Status: DC | PRN

## 2020-01-25 MED ORDER — POTASSIUM CHLORIDE CRYS ER 20 MEQ PO TBCR
40.0000 meq | EXTENDED_RELEASE_TABLET | Freq: Once | ORAL | Status: AC
  Administered 2020-01-25: 40 meq via ORAL
  Filled 2020-01-25: qty 2

## 2020-01-25 MED ORDER — MAGNESIUM SULFATE 2 GM/50ML IV SOLN
2.0000 g | Freq: Once | INTRAVENOUS | Status: AC
  Administered 2020-01-25: 2 g via INTRAVENOUS
  Filled 2020-01-25: qty 50

## 2020-01-25 MED ORDER — ASPIRIN 81 MG PO CHEW
81.0000 mg | CHEWABLE_TABLET | ORAL | Status: AC
  Administered 2020-01-26: 81 mg via ORAL
  Filled 2020-01-25: qty 1

## 2020-01-25 MED ORDER — SODIUM CHLORIDE 0.9 % IV SOLN: INTRAVENOUS | Status: DC

## 2020-01-25 MED ORDER — SODIUM CHLORIDE 0.9% FLUSH: 3.0000 mL | INTRAVENOUS | Status: DC | PRN

## 2020-01-25 NOTE — TOC Progression Note (Addendum)
Transition of Care [facility]) - Progression Note    Patient Details  Name: Obed Samek MRN: [id number] Date of Birth: [birth year]  [facility] CM/SW Contact  Leone Haven, RN Phone Number: 01/25/2020, 4:20 PM  Clinical Narrative:    NCM spoke with patient, he lives with brother and brother will be his transport at discharge.  NCM asked patient if he would like [facility] for CHF disease management, he said yes.  NCM lvm with Cassie with [facility] because this will need to be for charity.  Patient will need ast with meds thru [facility] and [facility].  Apt has been made with [facility] clinic also on [facility]. IF patient needs Entresto the HF clinic will help patient with the assistance with this.   4/30 1300- Per Cassie with [facility] they are able to take him for charity for [facility] for CHF.  He is also set up with paramedicine and HF team will be helping him with his medications. NCM left message with Dennison Bulla Revels in financial  Counseling to see if she can help him with applying for Medicaid.          Expected Discharge Plan and Services                                                 Social Determinants of Health (SDOH) Interventions    Readmission Risk Interventions No flowsheet data found.

## 2020-01-25 NOTE — Plan of Care (Signed)

## 2020-01-25 NOTE — Research (Signed)
[facility] Informed Consent   Subject Name: Corey Brady  Subject met inclusion and exclusion criteria.  The informed consent form, study requirements and expectations were reviewed with the subject and questions and concerns were addressed prior to the signing of the consent form.  The subject verbalized understanding of the trial requirements.  The subject agreed to participate in the [facility] trial and signed the informed consent at 1630 on 01/25/2020.  The informed consent was obtained prior to performance of any protocol-specific procedures for the subject.  A copy of the signed informed consent was given to the subject and a copy was placed in the subject's medical record.   [name]

## 2020-01-25 NOTE — Progress Notes (Signed)
Physical Therapy Treatment Patient Details Name: Corey Brady MRN: [id number] DOB: [birth year] Today's Date: 01/25/2020    History of Present Illness Pt is a 64 y/o male admitted secondary to Increased swelling in BLE; likely from CHF exacerbation. PMH includes CHF and gout.     PT Comments    Pt supine on entry, agreeable to ambulating with therapy. Pt limited in safe mobility by B LE pain L>R in presence of generalized weakness and decreased balance. Pt is supervision for bed mobility, min guard with transfers due to LoB requiring pt to sit back on bed after coming to standing, and min guard for ambulation with crutches utilized in a reciprocal pattern. D/c plans remain appropriate. PT will continue to follow acutely.    Follow Up Recommendations  No PT follow up     Equipment Recommendations  None recommended by PT(pt refusing RW )       Precautions / Restrictions Precautions Precautions: Fall Restrictions Weight Bearing Restrictions: No    Mobility  Bed Mobility Overal bed mobility: Needs Assistance Bed Mobility: Sit to Supine;Supine to Sit     Supine to sit: Min guard Sit to supine: Supervision   General bed mobility comments: Supervision to min guard for safety during bed mobility.   Transfers Overall transfer level: Needs assistance Equipment used: None Transfers: Sit to/from Stand Sit to Stand: Min guard         General transfer comment: min guard for safety, pt stood up had LoB and sat back down, collected himself and stood again this time maintaining balance, no c/o dizziness or lightheadedness  Ambulation/Gait Ambulation/Gait assistance: Supervision   Assistive device: Crutches Gait Pattern/deviations: Step-through pattern;Decreased stride length Gait velocity: Decreased Gait velocity interpretation: 1.31 - 2.62 ft/sec, indicative of limited community ambulator General Gait Details: reciprocal gait pattern with use of crutches for steadying          Balance Overall balance assessment: Mild deficits observed, not formally tested                                          Cognition Arousal/Alertness: Awake/alert Behavior During Therapy: WFL for tasks assessed/performed Overall Cognitive Status: Within Functional Limits for tasks assessed                                           General Comments General comments (skin integrity, edema, etc.): VSS on RA, bilateral LE wrapped in clean, dry compression wrap      Pertinent Vitals/Pain Pain Assessment: Faces Faces Pain Scale: Hurts a little bit Pain Location: L heel  Pain Descriptors / Indicators: Grimacing;Guarding Pain Intervention(s): Limited activity within patient's tolerance;Monitored during session;Repositioned           PT Goals (current goals can now be found in the care plan section) Acute Rehab PT Goals Patient Stated Goal: to go home PT Goal Formulation: With patient Time For Goal Achievement: 02/06/20 Potential to Achieve Goals: Good Progress towards PT goals: Progressing toward goals    Frequency    Min 3X/week      PT Plan Current plan remains appropriate       AM-PAC PT "6 Clicks" Mobility   Outcome Measure  Help needed turning from your back to your side while in a flat bed without using bedrails?:  None Help needed moving from lying on your back to sitting on the side of a flat bed without using bedrails?: A Little Help needed moving to and from a bed to a chair (including a wheelchair)?: None Help needed standing up from a chair using your arms (e.g., wheelchair or bedside chair)?: None Help needed to walk in hospital room?: None Help needed climbing 3-5 steps with a railing? : A Little 6 Click Score: 22    End of Session   Activity Tolerance: Patient tolerated treatment well Patient left: with call bell/phone within reach;in chair Nurse Communication: Mobility status PT Visit Diagnosis: Other abnormalities  of gait and mobility (R26.89)     Time: 1809-7044 PT Time Calculation (min) (ACUTE ONLY): 19 min  Charges:  $Gait Training: 8-22 mins                     Corey Brady B. Beverely Risen PT, DPT Acute Rehabilitation Services Pager [phone] Office [phone]    Corey Brady Fleet 01/25/2020, 3:59 PM

## 2020-01-25 NOTE — Progress Notes (Addendum)
Advanced Heart Failure Rounding Note  PCP-Cardiologist: No primary care provider on file.   Subjective:    Yesterday he continued to diurese with IV lasix and losartan was increased. Brisk diuresis noted.   Admission weight 252---> 236.5 pounds.   Feeling a little better. Denies SOB     Objective:   Weight Range: 107.3 kg Body mass index is 32.08 kg/m.   Vital Signs:   Temp:  [97.8 F (36.6 C)-98.8 F (37.1 C)] 97.8 F (36.6 C) (04/29 0922) Pulse Rate:  [51-111] 51 (04/29 0922) Resp:  [16-20] 16 (04/29 0922) BP: (100-133)/(65-111) 122/111 (04/29 0922) SpO2:  [95 %-98 %] 95 % (04/29 0922) Weight:  [107.3 kg] 107.3 kg (04/29 0405) Last BM Date: 01/22/20  Weight change: Filed Weights   01/23/20 2018 01/24/20 0321 01/25/20 0405  Weight: 114.3 kg 112.5 kg 107.3 kg    Intake/Output:   Intake/Output Summary (Last 24 hours) at 01/25/2020 0923 Last data filed at 01/25/2020 0859 Gross per 24 hour  Intake 1150 ml  Output 7850 ml  Net -6700 ml      Physical Exam  General:   No resp difficulty HEENT: normal Neck: supple. JVP 10-11. Carotids 2+ bilat; no bruits. No lymphadenopathy or thryomegaly appreciated. Cor: PMI nondisplaced. Regular rate & rhythm. No rubs, gallops or murmurs. Lungs: clear Abdomen: soft, nontender, nondistended. No hepatosplenomegaly. No bruits or masses. Good bowel sounds. Extremities: no cyanosis, clubbing, rash, R and LLE with compression wraps.  Neuro: alert & orientedx3, cranial nerves grossly intact. moves all 4 extremities w/o difficulty. Affect pleasant   Telemetry  SR/ST PACs 90-100s   EKG    n/a  Labs    CBC Recent Labs    01/24/20 0711 01/25/20 0335  WBC 6.0 6.2  HGB 15.0 14.0  HCT 44.3 42.2  MCV 89.1 89.8  PLT 164 353   Basic Metabolic Panel Recent Labs    01/24/20 0711 01/25/20 0335  NA 135 138  K 4.5 3.8  CL 101 101  CO2 23 27  GLUCOSE 98 95  BUN 15 13  CREATININE 0.91 0.91  CALCIUM 8.9 8.8*  MG  --   1.8   Liver Function Tests Recent Labs    01/23/20 0338  AST 31  ALT 15  ALKPHOS 99  BILITOT 1.4*  PROT 7.8  ALBUMIN 3.8   No results for input(s): LIPASE, AMYLASE in the last 72 hours. Cardiac Enzymes No results for input(s): CKTOTAL, CKMB, CKMBINDEX, TROPONINI in the last 72 hours.  BNP: BNP (last 3 results) Recent Labs    01/23/20 0338  BNP 1,370.5*    ProBNP (last 3 results) No results for input(s): PROBNP in the last 8760 hours.   D-Dimer No results for input(s): DDIMER in the last 72 hours. Hemoglobin A1C Recent Labs    01/23/20 1700  HGBA1C 6.3*   Fasting Lipid Panel Recent Labs    01/23/20 1700  CHOL 116  HDL 36*  LDLCALC 69  TRIG 56  CHOLHDL 3.2   Thyroid Function Tests Recent Labs    01/23/20 1700  TSH 2.785    Other results:   Imaging    No results found.   Medications:     Scheduled Medications: . digoxin  0.125 mg Oral Daily  . enoxaparin (LOVENOX) injection  40 mg Subcutaneous Q24H  . furosemide  40 mg Intravenous BID  . losartan  25 mg Oral Daily  . sodium chloride flush  3 mL Intravenous Q12H  . spironolactone  25 mg Oral Daily    Infusions: . sodium chloride 250 mL (01/24/20 1034)  . vancomycin 2,000 mg (01/24/20 1035)    PRN Medications: sodium chloride, acetaminophen **OR** acetaminophen, ondansetron **OR** ondansetron (ZOFRAN) IV, polyethylene glycol     Assessment/Plan   1. Acute Systolic Heart Failure ECHO EF 15% with severe global HK, and moderate RV dysfunction. ? Etiology possible ETOH versus HTN. TSH was ok. HIV NR  - Continues to diurese with IV lasix. Weight continues to fall.  - Continue IV lasix 40 mg twice a day. Renal function stable.  - Continue spiro 25 mg daily - Continue digoxin 0.125 mg daily  - Continue losartan 25 mg daily and will eventually will need entresto  - RHC/LHC tomorrow at 07:30   2. Cellulits , R and LLE legs Partial Thickness wound with palpable pedal pulses.  WBC  ok.  Started keflex 4/27-->Switch to vancomycin on 4/28  - WOC appreciated. consult.   3. ? H/O ETOH Abuse  Plan for RHC/LHC tomorrow at 07:30. Discussed cath.   Social - He has not insurance so we will need to provide 30 days of HF meds at discharge. Will notiffy HF Pharmacy and HF SW for med assist. Referr to  HF Paramedicine.   He will also need to be set up with PCP.    Length of Stay: 2  Tonye Becket, NP  01/25/2020, 9:23 AM  Advanced Heart Failure Team Pager [phone] (M-F; 7a - 4p)  Please contact [facility] for night-coverage after hours (4p -7a ) and weekends on [email or web address]  Patient seen and examined with the above-signed Advanced Practice Provider and/or Housestaff. I personally reviewed laboratory data, imaging studies and relevant notes. I independently examined the patient and formulated the important aspects of the plan. I have edited the note to reflect any of my changes or salient points. I have personally discussed the plan with the patient and/or family.  Diuresing well weight down over 20 pounds. Breathing better but still weak. Legs now wrapped. On vancomycin for cellulitis.  Renal function stable. Tolerating losartan and spiro  General:  Weak appearing. No resp difficulty HEENT: normal Neck: supple. JVP 9-10 . Carotids 2+ bilat; no bruits. No lymphadenopathy or thryomegaly appreciated. Cor: PMI nondisplaced. Regular rate & rhythm. No rubs, gallops or murmurs. Lungs: clear Abdomen: soft, nontender, nondistended. No hepatosplenomegaly. No bruits or masses. Good bowel sounds. Extremities: no cyanosis, clubbing, rash, wrapped. + edema Neuro: alert & orientedx3, cranial nerves grossly intact. moves all 4 extremities w/o difficulty. Affect pleasant  Continue IV diuresis today. Continue to titrate GDMT. Plan R/L cath tomorrow.   Arvilla Meres, MD  12:57 PM

## 2020-01-26 ENCOUNTER — Inpatient Hospital Stay (HOSPITAL_COMMUNITY): Payer: Self-pay

## 2020-01-26 ENCOUNTER — Encounter (HOSPITAL_COMMUNITY)
Admission: EM | Disposition: A | Discharge status: Home Health | Payer: Self-pay | Source: Home / Self Care | Attending: Thoracic Surgery (Cardiothoracic Vascular Surgery)

## 2020-01-26 DIAGNOSIS — I2510 Atherosclerotic heart disease of native coronary artery without angina pectoris: Secondary | ICD-10-CM

## 2020-01-26 DIAGNOSIS — I5021 Acute systolic (congestive) heart failure: Secondary | ICD-10-CM

## 2020-01-26 DIAGNOSIS — L03115 Cellulitis of right lower limb: Secondary | ICD-10-CM

## 2020-01-26 HISTORY — PX: RIGHT/LEFT HEART CATH AND CORONARY ANGIOGRAPHY: CATH118266

## 2020-01-26 LAB — POCT I-STAT 7, (LYTES, BLD GAS, ICA,H+H)
Acid-Base Excess: 5 mmol/L — ABNORMAL HIGH (ref 0.0–2.0)
Bicarbonate: 30.5 mmol/L — ABNORMAL HIGH (ref 20.0–28.0)
Calcium, Ion: 1.06 mmol/L — ABNORMAL LOW (ref 1.15–1.40)
HCT: 42 % (ref 39.0–52.0)
Hemoglobin: 14.3 g/dL (ref 13.0–17.0)
O2 Saturation: 99 %
Potassium: 3.9 mmol/L (ref 3.5–5.1)
Sodium: 140 mmol/L (ref 135–145)
TCO2: 32 mmol/L (ref 22–32)
pCO2 arterial: 47.6 mmHg (ref 32.0–48.0)
pH, Arterial: 7.415 (ref 7.350–7.450)
pO2, Arterial: 132 mmHg — ABNORMAL HIGH (ref 83.0–108.0)

## 2020-01-26 LAB — POCT I-STAT EG7
Acid-Base Excess: 3 mmol/L — ABNORMAL HIGH (ref 0.0–2.0)
Acid-Base Excess: 5 mmol/L — ABNORMAL HIGH (ref 0.0–2.0)
Bicarbonate: 28.4 mmol/L — ABNORMAL HIGH (ref 20.0–28.0)
Bicarbonate: 30.8 mmol/L — ABNORMAL HIGH (ref 20.0–28.0)
Calcium, Ion: 0.96 mmol/L — ABNORMAL LOW (ref 1.15–1.40)
Calcium, Ion: 1.15 mmol/L (ref 1.15–1.40)
HCT: 40 % (ref 39.0–52.0)
HCT: 43 % (ref 39.0–52.0)
Hemoglobin: 13.6 g/dL (ref 13.0–17.0)
Hemoglobin: 14.6 g/dL (ref 13.0–17.0)
O2 Saturation: 72 %
O2 Saturation: 73 %
Potassium: 3.5 mmol/L (ref 3.5–5.1)
Potassium: 4.1 mmol/L (ref 3.5–5.1)
Sodium: 140 mmol/L (ref 135–145)
Sodium: 143 mmol/L (ref 135–145)
TCO2: 30 mmol/L (ref 22–32)
TCO2: 32 mmol/L (ref 22–32)
pCO2, Ven: 46.4 mmHg (ref 44.0–60.0)
pCO2, Ven: 49.8 mmHg (ref 44.0–60.0)
pH, Ven: 7.395 (ref 7.250–7.430)
pH, Ven: 7.399 (ref 7.250–7.430)
pO2, Ven: 39 mmHg (ref 32.0–45.0)
pO2, Ven: 40 mmHg (ref 32.0–45.0)

## 2020-01-26 LAB — CBC
HCT: 43.6 % (ref 39.0–52.0)
Hemoglobin: 14.3 g/dL (ref 13.0–17.0)
MCH: 29.2 pg (ref 26.0–34.0)
MCHC: 32.8 g/dL (ref 30.0–36.0)
MCV: 89 fL (ref 80.0–100.0)
Platelets: 203 10*3/uL (ref 150–400)
RBC: 4.9 MIL/uL (ref 4.22–5.81)
RDW: 15.7 % — ABNORMAL HIGH (ref 11.5–15.5)
WBC: 6.9 10*3/uL (ref 4.0–10.5)
nRBC: 0 % (ref 0.0–0.2)

## 2020-01-26 LAB — BASIC METABOLIC PANEL
Anion gap: 11 (ref 5–15)
BUN: 16 mg/dL (ref 8–23)
CO2: 26 mmol/L (ref 22–32)
Calcium: 8.6 mg/dL — ABNORMAL LOW (ref 8.9–10.3)
Chloride: 100 mmol/L (ref 98–111)
Creatinine, Ser: 0.98 mg/dL (ref 0.61–1.24)
GFR calc Af Amer: >60 mL/min (ref >60)
GFR calc non Af Amer: >60 mL/min (ref >60)
Glucose, Bld: 91 mg/dL (ref 70–99)
Potassium: 4.1 mmol/L (ref 3.5–5.1)
Sodium: 137 mmol/L (ref 135–145)

## 2020-01-26 LAB — MAGNESIUM: Magnesium: 1.9 mg/dL (ref 1.7–2.4)

## 2020-01-26 SURGERY — RIGHT/LEFT HEART CATH AND CORONARY ANGIOGRAPHY
Anesthesia: LOCAL

## 2020-01-26 MED ORDER — FENTANYL CITRATE (PF) 100 MCG/2ML IJ SOLN
INTRAMUSCULAR | Status: DC | PRN
  Administered 2020-01-26: 25 ug via INTRAVENOUS

## 2020-01-26 MED ORDER — ONDANSETRON HCL 4 MG/2ML IJ SOLN: 4.0000 mg | Freq: Four times a day (QID) | INTRAMUSCULAR | Status: DC | PRN

## 2020-01-26 MED ORDER — VERAPAMIL HCL 2.5 MG/ML IV SOLN
INTRAVENOUS | Status: DC | PRN
  Administered 2020-01-26: 08:00:00 10 mL via INTRA_ARTERIAL

## 2020-01-26 MED ORDER — SODIUM CHLORIDE 0.9 % IV SOLN: 250.0000 mL | INTRAVENOUS | Status: DC | PRN

## 2020-01-26 MED ORDER — MIDAZOLAM HCL 2 MG/2ML IJ SOLN
INTRAMUSCULAR | Status: DC | PRN
  Administered 2020-01-26: 1 mg via INTRAVENOUS

## 2020-01-26 MED ORDER — SODIUM CHLORIDE 0.9% FLUSH
3.0000 mL | Freq: Two times a day (BID) | INTRAVENOUS | Status: DC
  Administered 2020-01-26 – 2020-01-29 (×5): 3 mL via INTRAVENOUS

## 2020-01-26 MED ORDER — LIDOCAINE HCL (PF) 1 % IJ SOLN
INTRAMUSCULAR | Status: DC | PRN
  Administered 2020-01-26 (×2): 2 mL

## 2020-01-26 MED ORDER — LABETALOL HCL 5 MG/ML IV SOLN: 10.0000 mg | INTRAVENOUS | Status: AC | PRN

## 2020-01-26 MED ORDER — IOHEXOL 350 MG/ML SOLN
INTRAVENOUS | Status: DC | PRN
  Administered 2020-01-26: 30 mL

## 2020-01-26 MED ORDER — FENTANYL CITRATE (PF) 100 MCG/2ML IJ SOLN
INTRAMUSCULAR | Status: AC
  Filled 2020-01-26: qty 2

## 2020-01-26 MED ORDER — HEPARIN (PORCINE) IN NACL 1000-0.9 UT/500ML-% IV SOLN
INTRAVENOUS | Status: AC
  Filled 2020-01-26: qty 1000

## 2020-01-26 MED ORDER — SODIUM CHLORIDE 0.9 % IV SOLN: INTRAVENOUS | Status: AC

## 2020-01-26 MED ORDER — HEPARIN SODIUM (PORCINE) 1000 UNIT/ML IJ SOLN
INTRAMUSCULAR | Status: DC | PRN
  Administered 2020-01-26: 5000 [IU] via INTRAVENOUS

## 2020-01-26 MED ORDER — VERAPAMIL HCL 2.5 MG/ML IV SOLN
INTRAVENOUS | Status: AC
  Filled 2020-01-26: qty 2

## 2020-01-26 MED ORDER — ENOXAPARIN SODIUM 40 MG/0.4ML ~~LOC~~ SOLN
40.0000 mg | SUBCUTANEOUS | Status: DC
  Administered 2020-01-27 – 2020-01-29 (×3): 40 mg via SUBCUTANEOUS
  Filled 2020-01-26 (×3): qty 0.4

## 2020-01-26 MED ORDER — MIDAZOLAM HCL 2 MG/2ML IJ SOLN
INTRAMUSCULAR | Status: AC
  Filled 2020-01-26: qty 2

## 2020-01-26 MED ORDER — HYDRALAZINE HCL 20 MG/ML IJ SOLN: 10.0000 mg | INTRAMUSCULAR | Status: AC | PRN

## 2020-01-26 MED ORDER — ACETAMINOPHEN 325 MG PO TABS: 650.0000 mg | ORAL_TABLET | ORAL | Status: DC | PRN

## 2020-01-26 MED ORDER — GADOBUTROL 1 MMOL/ML IV SOLN
10.0000 mL | Freq: Once | INTRAVENOUS | Status: AC | PRN
  Administered 2020-01-26: 10 mL via INTRAVENOUS

## 2020-01-26 MED ORDER — HEPARIN (PORCINE) IN NACL 1000-0.9 UT/500ML-% IV SOLN
INTRAVENOUS | Status: DC | PRN
  Administered 2020-01-26 (×2): 500 mL

## 2020-01-26 MED ORDER — LIDOCAINE HCL (PF) 1 % IJ SOLN
INTRAMUSCULAR | Status: AC
  Filled 2020-01-26: qty 30

## 2020-01-26 MED ORDER — SODIUM CHLORIDE 0.9% FLUSH: 3.0000 mL | INTRAVENOUS | Status: DC | PRN

## 2020-01-26 MED ORDER — HEPARIN SODIUM (PORCINE) 1000 UNIT/ML IJ SOLN
INTRAMUSCULAR | Status: AC
  Filled 2020-01-26: qty 1

## 2020-01-26 SURGICAL SUPPLY — 9 items
CATH 5FR JL3.5 JR4 ANG PIG MP (CATHETERS) ×1 IMPLANT
CATH BALLN WEDGE 5F 110CM (CATHETERS) ×1 IMPLANT
DEVICE RAD COMP TR BAND LRG (VASCULAR PRODUCTS) ×1 IMPLANT
GLIDESHEATH SLEND SS 6F .021 (SHEATH) ×1 IMPLANT
GUIDEWIRE INQWIRE 1.5J.035X260 (WIRE) IMPLANT
INQWIRE 1.5J .035X260CM (WIRE) ×2
PACK CARDIAC CATHETERIZATION (CUSTOM PROCEDURE TRAY) ×2 IMPLANT
SHEATH GLIDE SLENDER 4/5FR (SHEATH) ×1 IMPLANT
TRANSDUCER W/STOPCOCK (MISCELLANEOUS) ×2 IMPLANT

## 2020-01-26 NOTE — Plan of Care (Signed)
  Problem: Nutrition: Goal: Adequate nutrition will be maintained Outcome: Completed/Met   Problem: Coping: Goal: Level of anxiety will decrease Outcome: Completed/Met   Problem: Elimination: Goal: Will not experience complications related to bowel motility Outcome: Completed/Met Goal: Will not experience complications related to urinary retention Outcome: Completed/Met   Problem: Pain Managment: Goal: General experience of comfort will improve Outcome: Completed/Met   Problem: Safety: Goal: Ability to remain free from injury will improve Outcome: Completed/Met   

## 2020-01-26 NOTE — H&P (View-Only) (Signed)
Advanced Heart Failure Rounding Note  PCP-Cardiologist: No primary care provider on file.   Subjective:    Continues on IV lasix. Diuresing well.   Admission weight 252---> 236 -> 232  Breathing better. Still weak. No orthopnea or PND. Legs being wrapped    Objective:   Weight Range: 105.3 kg Body mass index is 31.49 kg/m.   Vital Signs:   Temp:  [97.8 F (36.6 C)-98.6 F (37 C)] 98.1 F (36.7 C) (04/30 0400) Pulse Rate:  [49-109] 105 (04/30 0400) Resp:  [16-19] 17 (04/30 0400) BP: (90-127)/(68-111) 123/76 (04/30 0400) SpO2:  [89 %-99 %] 95 % (04/30 0400) Weight:  [105.3 kg] 105.3 kg (04/30 0020) Last BM Date: 01/24/20  Weight change: Filed Weights   01/24/20 0321 01/25/20 0405 01/26/20 0020  Weight: 112.5 kg 107.3 kg 105.3 kg    Intake/Output:   Intake/Output Summary (Last 24 hours) at 01/26/2020 0744 Last data filed at 01/26/2020 0726 Gross per 24 hour  Intake 2041.87 ml  Output 5150 ml  Net -3108.13 ml      Physical Exam   General:  Lying in bed  No resp difficulty HEENT: normal Neck: supple. JVP 5-6. Carotids 2+ bilat; no bruits. No lymphadenopathy or thryomegaly appreciated. Cor: PMI nondisplaced. Regular rate & rhythm. No rubs, gallops or murmurs. Lungs: clear Abdomen: soft, nontender, nondistended. No hepatosplenomegaly. No bruits or masses. Good bowel sounds. Extremities: no cyanosis, clubbing, rash, 1+ edema Legs wraped Neuro: alert & orientedx3, cranial nerves grossly intact. moves all 4 extremities w/o difficulty. Affect pleasant   Telemetry  SR/ST PACs 95-105 Personally reviewed  EKG    n/a  Labs    CBC Recent Labs    01/25/20 0335 01/26/20 0423  WBC 6.2 6.9  HGB 14.0 14.3  HCT 42.2 43.6  MCV 89.8 89.0  PLT 193 203   Basic Metabolic Panel Recent Labs    60/10/93 0335 01/26/20 0423  NA 138 137  K 3.8 4.1  CL 101 100  CO2 27 26  GLUCOSE 95 91  BUN 13 16  CREATININE 0.91 0.98  CALCIUM 8.8* 8.6*  MG 1.8 1.9    Liver Function Tests No results for input(s): AST, ALT, ALKPHOS, BILITOT, PROT, ALBUMIN in the last 72 hours. No results for input(s): LIPASE, AMYLASE in the last 72 hours. Cardiac Enzymes No results for input(s): CKTOTAL, CKMB, CKMBINDEX, TROPONINI in the last 72 hours.  BNP: BNP (last 3 results) Recent Labs    01/23/20 0338  BNP 1,370.5*    ProBNP (last 3 results) No results for input(s): PROBNP in the last 8760 hours.   D-Dimer No results for input(s): DDIMER in the last 72 hours. Hemoglobin A1C Recent Labs    01/23/20 1700  HGBA1C 6.3*   Fasting Lipid Panel Recent Labs    01/23/20 1700  CHOL 116  HDL 36*  LDLCALC 69  TRIG 56  CHOLHDL 3.2   Thyroid Function Tests Recent Labs    01/23/20 1700  TSH 2.785    Other results:   Imaging    No results found.   Medications:     Scheduled Medications: . [MAR Hold] digoxin  0.125 mg Oral Daily  . [MAR Hold] enoxaparin (LOVENOX) injection  40 mg Subcutaneous Q24H  . [MAR Hold] furosemide  40 mg Intravenous BID  . [MAR Hold] losartan  25 mg Oral Daily  . [MAR Hold] sodium chloride flush  3 mL Intravenous Q12H  . [MAR Hold] spironolactone  25 mg Oral Daily  Infusions: . [MAR Hold] sodium chloride 250 mL (01/24/20 1034)  . sodium chloride    . sodium chloride 10 mL/hr at 01/26/20 0631  . [MAR Hold] vancomycin 2,000 mg (01/25/20 1229)    PRN Medications: [MAR Hold] sodium chloride, sodium chloride, [MAR Hold] acetaminophen **OR** [MAR Hold] acetaminophen, Heparin (Porcine) in NaCl, [MAR Hold] ondansetron **OR** [MAR Hold] ondansetron (ZOFRAN) IV, [MAR Hold] polyethylene glycol, sodium chloride flush     Assessment/Plan   1. Acute Systolic Heart Failure ECHO EF 15% with severe global HK, and moderate RV dysfunction. ? Etiology possible ETOH versus HTN (denies severe ETOH use). TSH was ok. HIV NR  - Continues to diurese with IV lasix. Weight down 20 pounds but likely more to go.  - Continue  spiro 25 mg daily - Continue digoxin 0.125 mg daily  - Continue losartan 25 mg daily and will eventually will need entresto  - Plan R/L cath this am. If cath negative consider cMRI.   2. Cellulits , R and LLE legs - Partial Thickness wound with palpable pedal pulses.  WBC ok.  Started keflex 4/27-->Switch to vancomycin on 4/28  - WOC appreciated - legs being wrapped - remains afebrile. No eeidence of sepsis at this point  3. ? H/O ETOH Abuse - denies heavy use   Social - He has not insurance so we will need to provide 30 days of HF meds at discharge.  Discussed with HF Pharmacy and HF SW for med assist. Referred to HF Paramedicine.   He will also need to be set up with PCP.    Length of Stay: 3  Laquandra Carrillo, MD  01/26/2020, 7:44 AM  Advanced Heart Failure Team Pager [phone] (M-F; 7a - 4p)  Please contact [facility] for night-coverage after hours (4p -7a ) and weekends on [email or web address]     

## 2020-01-26 NOTE — Interval H&P Note (Signed)
History and Physical Interval Note:  01/26/2020 7:50 AM  Corey Brady  has presented today for surgery, with the diagnosis of chf.  The various methods of treatment have been discussed with the patient and family. After consideration of risks, benefits and other options for treatment, the patient has consented to  Procedure(s): RIGHT/LEFT HEART CATH AND CORONARY ANGIOGRAPHY (N/A) and possible coronary angioplasty as a surgical intervention.  The patient's history has been reviewed, patient examined, no change in status, stable for surgery.  I have reviewed the patient's chart and labs.  Questions were answered to the patient's satisfaction.     Tekisha Darcey

## 2020-01-26 NOTE — Progress Notes (Signed)
Advanced Heart Failure Rounding Note  PCP-Cardiologist: No primary care provider on file.   Subjective:    Continues on IV lasix. Diuresing well.   Admission weight 252---> 236 -> 232  Breathing better. Still weak. No orthopnea or PND. Legs being wrapped    Objective:   Weight Range: 105.3 kg Body mass index is 31.49 kg/m.   Vital Signs:   Temp:  [97.8 F (36.6 C)-98.6 F (37 C)] 98.1 F (36.7 C) (04/30 0400) Pulse Rate:  [49-109] 105 (04/30 0400) Resp:  [16-19] 17 (04/30 0400) BP: (90-127)/(68-111) 123/76 (04/30 0400) SpO2:  [89 %-99 %] 95 % (04/30 0400) Weight:  [105.3 kg] 105.3 kg (04/30 0020) Last BM Date: 01/24/20  Weight change: Filed Weights   01/24/20 0321 01/25/20 0405 01/26/20 0020  Weight: 112.5 kg 107.3 kg 105.3 kg    Intake/Output:   Intake/Output Summary (Last 24 hours) at 01/26/2020 0744 Last data filed at 01/26/2020 0726 Gross per 24 hour  Intake 2041.87 ml  Output 5150 ml  Net -3108.13 ml      Physical Exam   General:  Lying in bed  No resp difficulty HEENT: normal Neck: supple. JVP 5-6. Carotids 2+ bilat; no bruits. No lymphadenopathy or thryomegaly appreciated. Cor: PMI nondisplaced. Regular rate & rhythm. No rubs, gallops or murmurs. Lungs: clear Abdomen: soft, nontender, nondistended. No hepatosplenomegaly. No bruits or masses. Good bowel sounds. Extremities: no cyanosis, clubbing, rash, 1+ edema Legs wraped Neuro: alert & orientedx3, cranial nerves grossly intact. moves all 4 extremities w/o difficulty. Affect pleasant   Telemetry  SR/ST PACs 95-105 Personally reviewed  EKG    n/a  Labs    CBC Recent Labs    01/25/20 0335 01/26/20 0423  WBC 6.2 6.9  HGB 14.0 14.3  HCT 42.2 43.6  MCV 89.8 89.0  PLT 193 203   Basic Metabolic Panel Recent Labs    60/10/93 0335 01/26/20 0423  NA 138 137  K 3.8 4.1  CL 101 100  CO2 27 26  GLUCOSE 95 91  BUN 13 16  CREATININE 0.91 0.98  CALCIUM 8.8* 8.6*  MG 1.8 1.9    Liver Function Tests No results for input(s): AST, ALT, ALKPHOS, BILITOT, PROT, ALBUMIN in the last 72 hours. No results for input(s): LIPASE, AMYLASE in the last 72 hours. Cardiac Enzymes No results for input(s): CKTOTAL, CKMB, CKMBINDEX, TROPONINI in the last 72 hours.  BNP: BNP (last 3 results) Recent Labs    01/23/20 0338  BNP 1,370.5*    ProBNP (last 3 results) No results for input(s): PROBNP in the last 8760 hours.   D-Dimer No results for input(s): DDIMER in the last 72 hours. Hemoglobin A1C Recent Labs    01/23/20 1700  HGBA1C 6.3*   Fasting Lipid Panel Recent Labs    01/23/20 1700  CHOL 116  HDL 36*  LDLCALC 69  TRIG 56  CHOLHDL 3.2   Thyroid Function Tests Recent Labs    01/23/20 1700  TSH 2.785    Other results:   Imaging    No results found.   Medications:     Scheduled Medications: . [MAR Hold] digoxin  0.125 mg Oral Daily  . [MAR Hold] enoxaparin (LOVENOX) injection  40 mg Subcutaneous Q24H  . [MAR Hold] furosemide  40 mg Intravenous BID  . [MAR Hold] losartan  25 mg Oral Daily  . [MAR Hold] sodium chloride flush  3 mL Intravenous Q12H  . [MAR Hold] spironolactone  25 mg Oral Daily  Infusions: . [MAR Hold] sodium chloride 250 mL (01/24/20 1034)  . sodium chloride    . sodium chloride 10 mL/hr at 01/26/20 0631  . [MAR Hold] vancomycin 2,000 mg (01/25/20 1229)    PRN Medications: [MAR Hold] sodium chloride, sodium chloride, [MAR Hold] acetaminophen **OR** [MAR Hold] acetaminophen, Heparin (Porcine) in NaCl, [MAR Hold] ondansetron **OR** [MAR Hold] ondansetron (ZOFRAN) IV, [MAR Hold] polyethylene glycol, sodium chloride flush     Assessment/Plan   1. Acute Systolic Heart Failure ECHO EF 15% with severe global HK, and moderate RV dysfunction. ? Etiology possible ETOH versus HTN (denies severe ETOH use). TSH was ok. HIV NR  - Continues to diurese with IV lasix. Weight down 20 pounds but likely more to go.  - Continue  spiro 25 mg daily - Continue digoxin 0.125 mg daily  - Continue losartan 25 mg daily and will eventually will need entresto  - Plan R/L cath this am. If cath negative consider cMRI.   2. Cellulits , R and LLE legs - Partial Thickness wound with palpable pedal pulses.  WBC ok.  Started keflex 4/27-->Switch to vancomycin on 4/28  - WOC appreciated - legs being wrapped - remains afebrile. No eeidence of sepsis at this point  3. ? H/O ETOH Abuse - denies heavy use   Social - He has not insurance so we will need to provide 30 days of HF meds at discharge.  Discussed with HF Pharmacy and HF SW for med assist. Referred to HF Paramedicine.   He will also need to be set up with PCP.    Length of Stay: 3  Glori Bickers, MD  01/26/2020, 7:44 AM  Advanced Heart Failure Team Pager [phone] (M-F; 7a - 4p)  Please contact [facility] for night-coverage after hours (4p -7a ) and weekends on [email or web address]

## 2020-01-26 NOTE — Progress Notes (Signed)
Outpatient CSW consulted to meet with pt regarding outpatient Community Paramedicine program through [facility]- Pt agreeable to being enrolled and confirmed that address and phone number were correct in the chart.  Paramedicine Initial Assessment:  Housing:  In what kind of housing do you live? House/apt/trailer/shelter? Single family home   Do you rent/pay a mortgage/own? Pt reports his brother owns the house  Do you live with anyone? Just his brother  Are you currently worried about losing your housing? No- he has lived there since about 2002 and reports no concerns about being able to stay there moving forward.  Within the past 12 months have you ever stayed outside, in a car, tent, a shelter, or temporarily with someone? no  Within the past 12 months have you been unable to get utilities when it was really needed? no  Social:  What is your current marital status? single  Do you have any children? no  Do you have family or friends who live locally?  Has a sister who also lives in [place] area who he thinks would be able to help sometimes if needed but reports his brother is his main support.  Food:  Within the past 12 months were you ever worried that food would run out before you got money to buy more? no  Within the past 66months have you run out of food and didn't have money to buy more? no  Income:  What is your current source of income? Started drawing retirement income early- thinks he gets about $1030/month  How hard is it for you to pay for the basics like food housing, medical care, and utilities? Not very hard  Do you have outstanding medical bills? yes  Insurance:  Are you currently insured? no  Do you have prescription coverage? no  If no insurance, have you applied for coverage (Medicaid, disability, marketplace etc)? Has not applied at this time but confirms that [facility] has reached out to him in the hospital and plans to send him paperwork to get  started in the mail- CSW able to confirm this from [facility] notes.  Informed pt he should be on the look out for that paperwork and let me know if he needs any help completing.  Transportation:  Do you have transportation to your medical appointments? yes   If yes, how? Has access to a vehicle and anticipates no barriers to being able to come for all medical appts.  In the past 12 months has lack of transportation kept you from medical appts or from getting medications? no  In the past 12 months has lack of transportation kept you from meetings, work, or getting things you needed? no   Daily Health Needs: Do you have a working scale at home? Yes- discussed importance of weighing daily  How do you manage your medications at home? Was not on medications prior to admission per pt report  Do you have issues affording your medications? Does not have current insurance so some concerns about how he will afford medications at discharge.  Do you have any concerns with mobility at home? Reports being fairly independent at home but sometimes using crutches or a cane if he has a gout flare up.  Do you have a PCP? No- but is agreeable to getting one- will likely be set up with initial appt by inpatient [facility] team prior to DC but CSW will f/up regarding getting PCP if not.   Are there any additional barriers you see to  getting the care you need? None reported at this time.  CSW will continue to follow through paramedicine program and assist as needed.  Burna Sis, LCSW Clinical Social Worker [facility] Desk#: [phone] Cell#: [phone]

## 2020-01-26 NOTE — Progress Notes (Signed)
Patient has arrived back onto the unit from cath procedure. Patient is alert and oriented x 4 and has no complaints of pain. TR band site at the R radial has no signs of drainage filled with 12cc and will begin to removed 2cc of air in 30 mins. R brachial site has a wrap dressing, no drainage. Skin surrounding the area is soft and no signs of hard mass.

## 2020-01-26 NOTE — Plan of Care (Signed)
  Problem: Education: Goal: Knowledge of General Education information will improve Description: Including pain rating scale, medication(s)/side effects and non-pharmacologic comfort measures Outcome: Progressing   Problem: Health Behavior/Discharge Planning: Goal: Ability to manage health-related needs will improve Outcome: Progressing   Problem: Clinical Measurements: Goal: Ability to maintain clinical measurements within normal limits will improve Outcome: Progressing Goal: Will remain free from infection Outcome: Progressing Goal: Diagnostic test results will improve Outcome: Progressing Goal: Respiratory complications will improve Outcome: Progressing Goal: Cardiovascular complication will be avoided Outcome: Progressing   Problem: Activity: Goal: Risk for activity intolerance will decrease Outcome: Progressing   Problem: Skin Integrity: Goal: Risk for impaired skin integrity will decrease Outcome: Progressing   Problem: Education: Goal: Ability to demonstrate management of disease process will improve Outcome: Progressing Goal: Ability to verbalize understanding of medication therapies will improve Outcome: Progressing Goal: Individualized Educational Video(s) Outcome: Progressing   Problem: Activity: Goal: Capacity to carry out activities will improve Outcome: Progressing   Problem: Cardiac: Goal: Ability to achieve and maintain adequate cardiopulmonary perfusion will improve Outcome: Progressing   

## 2020-01-27 ENCOUNTER — Encounter (HOSPITAL_COMMUNITY): Payer: Self-pay | Admitting: Internal Medicine

## 2020-01-27 DIAGNOSIS — I25110 Atherosclerotic heart disease of native coronary artery with unstable angina pectoris: Secondary | ICD-10-CM

## 2020-01-27 DIAGNOSIS — Z789 Other specified health status: Secondary | ICD-10-CM

## 2020-01-27 DIAGNOSIS — I5041 Acute combined systolic (congestive) and diastolic (congestive) heart failure: Secondary | ICD-10-CM

## 2020-01-27 DIAGNOSIS — I25118 Atherosclerotic heart disease of native coronary artery with other forms of angina pectoris: Secondary | ICD-10-CM

## 2020-01-27 DIAGNOSIS — I509 Heart failure, unspecified: Secondary | ICD-10-CM

## 2020-01-27 HISTORY — DX: Other specified health status: Z78.9

## 2020-01-27 LAB — CBC
HCT: 44.8 % (ref 39.0–52.0)
Hemoglobin: 14.7 g/dL (ref 13.0–17.0)
MCH: 29.9 pg (ref 26.0–34.0)
MCHC: 32.8 g/dL (ref 30.0–36.0)
MCV: 91.1 fL (ref 80.0–100.0)
Platelets: 223 10*3/uL (ref 150–400)
RBC: 4.92 MIL/uL (ref 4.22–5.81)
RDW: 15.7 % — ABNORMAL HIGH (ref 11.5–15.5)
WBC: 6.7 10*3/uL (ref 4.0–10.5)
nRBC: 0 % (ref 0.0–0.2)

## 2020-01-27 LAB — BASIC METABOLIC PANEL
Anion gap: 7 (ref 5–15)
BUN: 15 mg/dL (ref 8–23)
CO2: 28 mmol/L (ref 22–32)
Calcium: 9 mg/dL (ref 8.9–10.3)
Chloride: 101 mmol/L (ref 98–111)
Creatinine, Ser: 1.05 mg/dL (ref 0.61–1.24)
GFR calc Af Amer: >60 mL/min (ref >60)
GFR calc non Af Amer: >60 mL/min (ref >60)
Glucose, Bld: 112 mg/dL — ABNORMAL HIGH (ref 70–99)
Potassium: 5.4 mmol/L — ABNORMAL HIGH (ref 3.5–5.1)
Sodium: 136 mmol/L (ref 135–145)

## 2020-01-27 MED ORDER — SPIRONOLACTONE 12.5 MG HALF TABLET
12.5000 mg | ORAL_TABLET | Freq: Every day | ORAL | Status: DC
  Administered 2020-01-28: 12.5 mg via ORAL
  Filled 2020-01-27 (×2): qty 1

## 2020-01-27 MED ORDER — FUROSEMIDE 40 MG PO TABS
40.0000 mg | ORAL_TABLET | Freq: Every day | ORAL | Status: DC
  Administered 2020-01-27 – 2020-01-29 (×3): 40 mg via ORAL
  Filled 2020-01-27 (×3): qty 1

## 2020-01-27 MED ORDER — SPIRONOLACTONE 25 MG PO TABS: 25.0000 mg | ORAL_TABLET | Freq: Every day | ORAL | Status: DC

## 2020-01-27 NOTE — Consult Note (Signed)
[address]       [place] [address]             [phone]        [facility] Medical Record #[id number] Date of Birth: 1956-09-20  Referring: No ref. provider found Primary Care: Patient, No Pcp Per Primary Cardiologist:No primary care provider on file.  Chief Complaint:    Chief Complaint  Patient presents with  . Leg Swelling    History of Present Illness:     Corey Brady is a 64 year old gentleman admitted to the heart failure service following an elective L/RHC for further management of his disease.  He has an EF of 15-20%, and LHC showed a CTO LAD lesion that fills from right sided collaterals.  CTS was consulted to assess his candidacy for LIMA LAD bypass.     Past Medical History:  Diagnosis Date  . Gout     Past Surgical History:  Procedure Laterality Date  . RIGHT/LEFT HEART CATH AND CORONARY ANGIOGRAPHY N/A 01/26/2020   Procedure: RIGHT/LEFT HEART CATH AND CORONARY ANGIOGRAPHY;  Surgeon: Dolores Patty, MD;  Location: [facility];  Service: Cardiovascular;  Laterality: N/A;     Social History   Tobacco Use  Smoking Status Never Smoker    Social History   Substance and Sexual Activity  Alcohol Use Yes     No Known Allergies   Current Facility-Administered Medications  Medication Dose Route Frequency Provider Last Rate Last Admin  . 0.9 %  sodium chloride infusion   Intravenous PRN Bensimhon, Bevelyn Buckles, MD 10 mL/hr at 01/24/20 1034 250 mL at 01/24/20 1034  . 0.9 %  sodium chloride infusion  250 mL Intravenous PRN Bensimhon, Bevelyn Buckles, MD      . acetaminophen (TYLENOL) tablet 650 mg  650 mg Oral Q4H PRN Bensimhon, Bevelyn Buckles, MD      . digoxin (LANOXIN) tablet 0.125 mg  0.125 mg Oral Daily Bensimhon, Bevelyn Buckles, MD   0.125 mg at 01/27/20 0939  . enoxaparin (LOVENOX) injection 40 mg  40 mg Subcutaneous Q24H Bensimhon, Bevelyn Buckles, MD   40 mg at 01/27/20 8338  . furosemide (LASIX) tablet 40 mg  40 mg Oral Daily  Lars Masson, MD      . losartan (COZAAR) tablet 25 mg  25 mg Oral Daily Bensimhon, Bevelyn Buckles, MD   25 mg at 01/27/20 0939  . ondansetron (ZOFRAN) injection 4 mg  4 mg Intravenous Q6H PRN Bensimhon, Bevelyn Buckles, MD      . ondansetron (ZOFRAN) tablet 4 mg  4 mg Oral Q6H PRN Bensimhon, Bevelyn Buckles, MD      . polyethylene glycol (MIRALAX / GLYCOLAX) packet 17 g  17 g Oral Daily PRN Bensimhon, Bevelyn Buckles, MD   17 g at 01/26/20 1526  . sodium chloride flush (NS) 0.9 % injection 3 mL  3 mL Intravenous Q12H Bensimhon, Bevelyn Buckles, MD   3 mL at 01/26/20 2242  . sodium chloride flush (NS) 0.9 % injection 3 mL  3 mL Intravenous Q12H Bensimhon, Bevelyn Buckles, MD   3 mL at 01/26/20 1325  . sodium chloride flush (NS) 0.9 % injection 3 mL  3 mL Intravenous PRN Bensimhon, Bevelyn Buckles, MD      . Melene Muller ON 01/28/2020] spironolactone (ALDACTONE) tablet 12.5 mg  12.5 mg Oral Daily Lars Masson, MD      . vancomycin (VANCOREADY) IVPB 2000 mg/400 mL  2,000 mg  Intravenous Q24H Bensimhon, Shaune Pascal, MD 200 mL/hr at 01/27/20 1103 2,000 mg at 01/27/20 1103    Medications Prior to Admission  Medication Sig Dispense Refill Last Dose  . ibuprofen (ADVIL) 200 MG tablet Take 200 mg by mouth every 6 (six) hours as needed for moderate pain.   Past Week at Unknown time    History reviewed. No pertinent family history.   Review of Systems:   Review of Systems  Constitutional: Positive for malaise/fatigue.  Respiratory: Positive for shortness of breath.   Cardiovascular: Positive for leg swelling.  Musculoskeletal: Positive for joint pain.      Physical Exam: BP 103/74 (BP Location: Left Arm)   Pulse 96   Temp (!) 97.4 F (36.3 C) (Oral)   Resp 18   Ht 6' (1.829 m)   Wt 103.5 kg   SpO2 98%   BMI 30.94 kg/m  Physical Exam  Constitutional: He is oriented to person, place, and time. No distress.  HENT:  Head: Normocephalic and atraumatic.  Eyes: Conjunctivae are normal.  Cardiovascular: Normal rate and regular rhythm.    No murmur heard. Pulmonary/Chest: Effort normal. No respiratory distress.  Abdominal: He exhibits no distension.  Musculoskeletal:        General: Edema present.     Cervical back: Normal range of motion.  Neurological: He is alert and oriented to person, place, and time.  Skin: Skin is dry. He is not diaphoretic.        I have independently reviewed the above radiologic studies and discussed with the patient   Recent Lab Findings: Lab Results  Component Value Date   WBC 6.7 01/27/2020   HGB 14.7 01/27/2020   HCT 44.8 01/27/2020   PLT 223 01/27/2020   GLUCOSE 112 (H) 01/27/2020   CHOL 116 01/23/2020   TRIG 56 01/23/2020   HDL 36 (L) 01/23/2020   LDLCALC 69 01/23/2020   ALT 15 01/23/2020   AST 31 01/23/2020   NA 136 01/27/2020   K 5.4 (H) 01/27/2020   CL 101 01/27/2020   CREATININE 1.05 01/27/2020   BUN 15 01/27/2020   CO2 28 01/27/2020   TSH 2.785 01/23/2020   HGBA1C 6.3 (H) 01/23/2020      Assessment / Plan:   64 year old male congestive heart failure with an EF of 15 to 20%, and a CTO LAD lesion with retrograde filling.  The vessel appears small on left heart cath, and there is question about the viability of his anterior wall.  He is scheduled to undergo an MRI and this will help [place] determine whether or not he has any viable muscle in that distribution.  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 This will make his operation very high risk, but with a hgb > 12 for a single off pump bypass, this can be considered if he has viable muscle.     I  spent 25 minutes counseling the patient face to face.   Lajuana Matte 01/27/2020 1:14 PM

## 2020-01-27 NOTE — Progress Notes (Addendum)
    CT Surgery Consult had been requested by [name] but CT Surgery was unaware. I spoke with Dr. Cliffton Asters and he will consult on the patient later today.   Signed, Ellsworth Lennox, PA-C 01/27/2020, 10:36 AM Pager: [phone]

## 2020-01-27 NOTE — Progress Notes (Addendum)
Cardiology Rounding Note  PCP-Cardiologist: No primary care provider on file.   Subjective:    Continues on IV lasix. Diuresing well.   Admission weight 252---> 236 -> 232 --> 227  Breathing better. NO chest pain. No orthopnea or PND. Legs being wrapped , walked with physical therapy yesterday.  Objective:   Weight Range: 103.5 kg Body mass index is 30.94 kg/m.   Vital Signs:   Temp:  [97.4 F (36.3 C)-98.2 F (36.8 C)] 97.5 F (36.4 C) (05/01 0952) Pulse Rate:  [93-100] 93 (05/01 0952) Resp:  [19-20] 20 (05/01 0952) BP: (92-124)/(67-83) 103/71 (05/01 0952) SpO2:  [94 %-96 %] 96 % (05/01 0952) Weight:  [103.5 kg] 103.5 kg (05/01 0500) Last BM Date: 01/24/20  Weight change: Filed Weights   01/25/20 0405 01/26/20 0020 01/27/20 0500  Weight: 107.3 kg 105.3 kg 103.5 kg   Intake/Output:   Intake/Output Summary (Last 24 hours) at 01/27/2020 1023 Last data filed at 01/27/2020 0900 Gross per 24 hour  Intake 1314.75 ml  Output 3325 ml  Net -2010.25 ml    Physical Exam   General:  Lying in bed  No resp difficulty HEENT: normal Neck: supple. JVP 5-6. Carotids 2+ bilat; no bruits. No lymphadenopathy or thryomegaly appreciated. Cor: PMI nondisplaced. Regular rate & rhythm. No rubs, gallops or murmurs. Lungs: clear Abdomen: soft, nontender, nondistended. No hepatosplenomegaly. No bruits or masses. Good bowel sounds. Extremities: no cyanosis, clubbing, rash, 1+ edema Legs wraped Neuro: alert & orientedx3, cranial nerves grossly intact. moves all 4 extremities w/o difficulty. Affect pleasant  Telemetry  SR/ST PACs 95-105 Personally reviewed  EKG    n/a  Labs    CBC Recent Labs    01/26/20 0423 01/26/20 0816 01/26/20 0821 01/27/20 0425  WBC 6.9  --   --  6.7  HGB 14.3   < > 14.6  13.6 14.7  HCT 43.6   < > 43.0  40.0 44.8  MCV 89.0  --   --  91.1  PLT 203  --   --  223   < > = values in this interval not displayed.   Basic Metabolic Panel Recent Labs    33/00/76 0335 01/25/20 0335 01/26/20 0423 01/26/20 0816 01/26/20 0821 01/27/20 0425  NA 138   < > 137   < > 140  143 136  K 3.8   < > 4.1   < > 4.1  3.5 5.4*  CL 101   < > 100  --   --  101  CO2 27   < > 26  --   --  28  GLUCOSE 95   < > 91  --   --  112*  BUN 13   < > 16  --   --  15  CREATININE 0.91   < > 0.98  --   --  1.05  CALCIUM 8.8*   < > 8.6*  --   --  9.0  MG 1.8  --  1.9  --   --   --    < > = values in this interval not displayed.   Liver Function Tests No results for input(s): AST, ALT, ALKPHOS, BILITOT, PROT, ALBUMIN in the last 72 hours. No results for input(s): LIPASE, AMYLASE in the last 72 hours. Cardiac Enzymes No results for input(s): CKTOTAL, CKMB, CKMBINDEX, TROPONINI in the last 72 hours.  BNP: BNP (last 3 results) Recent Labs    01/23/20 0338  BNP 1,370.5*    ProBNP (  last 3 results) No results for input(s): PROBNP in the last 8760 hours.   D-Dimer No results for input(s): DDIMER in the last 72 hours. Hemoglobin A1C No results for input(s): HGBA1C in the last 72 hours. Fasting Lipid Panel No results for input(s): CHOL, HDL, LDLCALC, TRIG, CHOLHDL, LDLDIRECT in the last 72 hours. Thyroid Function Tests No results for input(s): TSH, T4TOTAL, T3FREE, THYROIDAB in the last 72 hours.  Invalid input(s): FREET3  Medications:    Scheduled Medications: . digoxin  0.125 mg Oral Daily  . enoxaparin (LOVENOX) injection  40 mg Subcutaneous Q24H  . losartan  25 mg Oral Daily  . sodium chloride flush  3 mL Intravenous Q12H  . sodium chloride flush  3 mL Intravenous Q12H  . [START ON 01/28/2020] spironolactone  25 mg Oral Daily   Infusions: . sodium chloride 250 mL (01/24/20 1034)  . sodium chloride    . vancomycin 2,000 mg (01/26/20 1105)    PRN Medications: sodium chloride, sodium chloride, acetaminophen, ondansetron (ZOFRAN) IV, ondansetron **OR** [DISCONTINUED] ondansetron (ZOFRAN) IV, polyethylene glycol, sodium chloride flush  Cardiac  cath: 01/26/2020 Assessment: 1. Severe 1v CAD with ostially occlusion of LAD with prominent R->L collaterals and good target 2. Ischemic CM with EF 20%  3. Well preserved hemodynamics  Plan/Discussion: cMRI for viability. TCTS consult for LIMA to LAD.   CMR: 01/26/2020 IMPRESSION: 1. Moderately dilated left ventricle with EF 18%, wall motion abnormalities as noted above. 2. Mildly dilated RV with moderately decreased systolic function, EF 30%. 3. Delayed enhancement pattern as described above, this suggests significant viability in the LAD territory.   Assessment/Plan   1. Acute Systolic Heart Failure ECHO EF 15% with severe global HK, and moderate RV dysfunction. ? Etiology possible ETOH versus HTN (denies severe ETOH use). TSH was ok. HIV NR  - Continues to diurese with IV lasix. Weight down 25 pounds, I will switch to p.o. Lasix 40 mg daily - Decrease spiro to 12.5 mg daily given hyperkalemia - Continue digoxin 0.125 mg daily  - Continue losartan 25 mg daily and will eventually will need entresto   2. CAD  - Severe 1v CAD with ostially occlusion of LAD with prominent R->L collaterals and good target, MRI yesterday shows really good viability in LAD territory, we will consult the CTS for LIMA to LAD.  3. Cellulits , R and LLE legs - Partial Thickness wound with palpable pedal pulses.  WBC ok.  Started keflex 4/27-->Switch to vancomycin on 4/28  - WOC appreciated - legs being wrapped - remains afebrile. No eeidence of sepsis at this point  4. ? H/O ETOH Abuse - denies heavy use  Social - He has not insurance so we will need to provide 30 days of HF meds at discharge.  Discussed with HF Pharmacy and HF SW for med assist. Referred to HF Paramedicine.   He will also need to be set up with PCP.    Length of Stay: 4  Ena Dawley, MD  01/27/2020, 10:23 AM

## 2020-01-28 ENCOUNTER — Encounter (HOSPITAL_COMMUNITY): Payer: Self-pay | Admitting: Internal Medicine

## 2020-01-28 LAB — BASIC METABOLIC PANEL
Anion gap: 9 (ref 5–15)
BUN: 14 mg/dL (ref 8–23)
CO2: 29 mmol/L (ref 22–32)
Calcium: 9.5 mg/dL (ref 8.9–10.3)
Chloride: 100 mmol/L (ref 98–111)
Creatinine, Ser: 1.09 mg/dL (ref 0.61–1.24)
GFR calc Af Amer: >60 mL/min (ref >60)
GFR calc non Af Amer: >60 mL/min (ref >60)
Glucose, Bld: 92 mg/dL (ref 70–99)
Potassium: 4.9 mmol/L (ref 3.5–5.1)
Sodium: 138 mmol/L (ref 135–145)

## 2020-01-28 LAB — CBC
HCT: 47.7 % (ref 39.0–52.0)
Hemoglobin: 15.8 g/dL (ref 13.0–17.0)
MCH: 30.2 pg (ref 26.0–34.0)
MCHC: 33.1 g/dL (ref 30.0–36.0)
MCV: 91 fL (ref 80.0–100.0)
Platelets: 235 10*3/uL (ref 150–400)
RBC: 5.24 MIL/uL (ref 4.22–5.81)
RDW: 15.5 % (ref 11.5–15.5)
WBC: 6.6 10*3/uL (ref 4.0–10.5)
nRBC: 0 % (ref 0.0–0.2)

## 2020-01-28 NOTE — Progress Notes (Signed)
Cardiology Rounding Note  PCP-Cardiologist: No primary care provider on file.   Subjective:    Continues on IV lasix. Diuresing well.   Admission weight 252---> 236 -> 232 --> 227 -->220  She feels significantly better. No chest pain. No orthopnea or PND. Legs being wrapped , walked with physical therapy yesterday.  Objective:    Weight Range: 100.5 kg Body mass index is 30.04 kg/m.   Vital Signs:   Temp:  [97.4 F (36.3 C)-98.2 F (36.8 C)] 98.1 F (36.7 C) (05/02 0445) Pulse Rate:  [93-98] 98 (05/02 0445) Resp:  [14-20] 14 (05/02 0445) BP: (103-111)/(70-78) 111/78 (05/02 0445) SpO2:  [95 %-98 %] 97 % (05/02 0445) Weight:  [100.5 kg] 100.5 kg (05/02 0445) Last BM Date: (01/24/2020)  Weight change: Filed Weights   01/26/20 0020 01/27/20 0500 01/28/20 0445  Weight: 105.3 kg 103.5 kg 100.5 kg   Intake/Output:   Intake/Output Summary (Last 24 hours) at 01/28/2020 0943 Last data filed at 01/28/2020 0858 Gross per 24 hour  Intake 1180 ml  Output 4776 ml  Net -3596 ml    Physical Exam   General:  Lying in bed  No resp difficulty HEENT: normal Neck: supple. JVP 4. Carotids 2+ bilat; no bruits. No lymphadenopathy or thryomegaly appreciated. Cor: PMI nondisplaced. Regular rate & rhythm. No rubs, gallops or murmurs. Lungs: clear Abdomen: soft, nontender, nondistended. No hepatosplenomegaly. No bruits or masses. Good bowel sounds. Extremities: no cyanosis, clubbing, rash, 1+ edema Legs wraped Neuro: alert & orientedx3, cranial nerves grossly intact. moves all 4 extremities w/o difficulty. Affect pleasant  Telemetry  SR/ST PACs 95-105 Personally reviewed  EKG    n/a  Labs    CBC Recent Labs    01/27/20 0425 01/28/20 0619  WBC 6.7 6.6  HGB 14.7 15.8  HCT 44.8 47.7  MCV 91.1 91.0  PLT 223 235   Basic Metabolic Panel Recent Labs    29/52/84 0423 01/26/20 0816 01/27/20 0425 01/28/20 0619  NA 137   < > 136 138  K 4.1   < > 5.4* 4.9  CL 100   < > 101  100  CO2 26   < > 28 29  GLUCOSE 91   < > 112* 92  BUN 16   < > 15 14  CREATININE 0.98   < > 1.05 1.09  CALCIUM 8.6*   < > 9.0 9.5  MG 1.9  --   --   --    < > = values in this interval not displayed.   Liver Function Tests No results for input(s): AST, ALT, ALKPHOS, BILITOT, PROT, ALBUMIN in the last 72 hours. No results for input(s): LIPASE, AMYLASE in the last 72 hours. Cardiac Enzymes No results for input(s): CKTOTAL, CKMB, CKMBINDEX, TROPONINI in the last 72 hours.  BNP: BNP (last 3 results) Recent Labs    01/23/20 0338  BNP 1,370.5*    ProBNP (last 3 results) No results for input(s): PROBNP in the last 8760 hours.   D-Dimer No results for input(s): DDIMER in the last 72 hours. Hemoglobin A1C No results for input(s): HGBA1C in the last 72 hours. Fasting Lipid Panel No results for input(s): CHOL, HDL, LDLCALC, TRIG, CHOLHDL, LDLDIRECT in the last 72 hours. Thyroid Function Tests No results for input(s): TSH, T4TOTAL, T3FREE, THYROIDAB in the last 72 hours.  Invalid input(s): FREET3  Medications:    Scheduled Medications: . digoxin  0.125 mg Oral Daily  . enoxaparin (LOVENOX) injection  40 mg Subcutaneous Q24H  .  furosemide  40 mg Oral Daily  . losartan  25 mg Oral Daily  . sodium chloride flush  3 mL Intravenous Q12H  . sodium chloride flush  3 mL Intravenous Q12H  . spironolactone  12.5 mg Oral Daily   Infusions: . sodium chloride 250 mL (01/24/20 1034)  . sodium chloride    . vancomycin 2,000 mg (01/27/20 1103)    PRN Medications: sodium chloride, sodium chloride, acetaminophen, ondansetron (ZOFRAN) IV, ondansetron **OR** [DISCONTINUED] ondansetron (ZOFRAN) IV, polyethylene glycol, sodium chloride flush  Cardiac cath: 01/26/2020 Assessment: 1. Severe 1v CAD with ostially occlusion of LAD with prominent R->L collaterals and good target 2. Ischemic CM with EF 20%  3. Well preserved hemodynamics  Plan/Discussion: cMRI for viability. TCTS consult for  LIMA to LAD.   CMR: 01/26/2020 IMPRESSION: 1. Moderately dilated left ventricle with EF 18%, wall motion abnormalities as noted above. 2. Mildly dilated RV with moderately decreased systolic function, EF 62%. 3. Delayed enhancement pattern as described above, this suggests significant viability in the LAD territory.   Assessment/Plan   1. Acute Systolic Heart Failure ECHO EF 15% with severe global HK, and moderate RV dysfunction. ? Etiology possible ETOH versus HTN (denies severe ETOH use). TSH was ok. HIV NR  - Continues to diurese with IV lasix. Weight down 30 pounds, I will continue to p.o. Lasix 40 mg daily, she responded well it it overnight - Decrease spiro to 12.5 mg daily given hyperkalemia, now improved K 5.4-->4.9 - Continue digoxin 0.125 mg daily  - Continue losartan 25 mg daily and will eventually will need entresto   2. CAD  - Severe 1v CAD with ostially occlusion of LAD with prominent R->L collaterals and good target, MRI yesterday shows really good viability in LAD territory. Dr Kipp Brood didn't see MRI results at the time of consult.  Dr Kipp Brood saw yesterday and thinks that the patient is a very high risk given small vessels on left heart cath, the patient being [place] and will not accept any blood products. This will make his operation very high risk, but with a hgb > 12 for a single off pump bypass, this can be considered if he has viable muscle.  3. Cellulits , R and LLE legs - Partial Thickness wound with palpable pedal pulses.  WBC ok.  Started keflex 4/27-->Switch to vancomycin on 4/28  - WOC appreciated - legs being wrapped - remains afebrile. No eeidence of sepsis at this point  4. ? H/O ETOH Abuse - denies heavy use  Social - He has not insurance so we will need to provide 30 days of HF meds at discharge.  Discussed with HF Pharmacy and HF SW for med assist. Referred to HF Paramedicine.   He will also need to be set up with PCP.    Length  of Stay: 5  Ena Dawley, MD  01/28/2020, 9:43 AM

## 2020-01-29 ENCOUNTER — Other Ambulatory Visit: Payer: Self-pay | Admitting: *Deleted

## 2020-01-29 LAB — URINALYSIS, ROUTINE W REFLEX MICROSCOPIC
Bilirubin Urine: NEGATIVE
Glucose, UA: NEGATIVE mg/dL
Hgb urine dipstick: NEGATIVE
Ketones, ur: NEGATIVE mg/dL
Leukocytes,Ua: NEGATIVE
Nitrite: NEGATIVE
Protein, ur: NEGATIVE mg/dL
Specific Gravity, Urine: 1.014 (ref 1.005–1.030)
pH: 5 (ref 5.0–8.0)

## 2020-01-29 LAB — CBC
HCT: 49.3 % (ref 39.0–52.0)
Hemoglobin: 16.2 g/dL (ref 13.0–17.0)
MCH: 29.8 pg (ref 26.0–34.0)
MCHC: 32.9 g/dL (ref 30.0–36.0)
MCV: 90.6 fL (ref 80.0–100.0)
Platelets: 260 10*3/uL (ref 150–400)
RBC: 5.44 MIL/uL (ref 4.22–5.81)
RDW: 15.2 % (ref 11.5–15.5)
WBC: 5.6 10*3/uL (ref 4.0–10.5)
nRBC: 0 % (ref 0.0–0.2)

## 2020-01-29 LAB — BASIC METABOLIC PANEL
Anion gap: 12 (ref 5–15)
BUN: 18 mg/dL (ref 8–23)
CO2: 28 mmol/L (ref 22–32)
Calcium: 9.2 mg/dL (ref 8.9–10.3)
Chloride: 98 mmol/L (ref 98–111)
Creatinine, Ser: 1.16 mg/dL (ref 0.61–1.24)
GFR calc Af Amer: >60 mL/min (ref >60)
GFR calc non Af Amer: >60 mL/min (ref >60)
Glucose, Bld: 97 mg/dL (ref 70–99)
Potassium: 4.1 mmol/L (ref 3.5–5.1)
Sodium: 138 mmol/L (ref 135–145)

## 2020-01-29 LAB — BPAM RBC
Blood Product Expiration Date: 202105312359
Unit Type and Rh: 5100

## 2020-01-29 LAB — BLOOD GAS, ARTERIAL
Acid-Base Excess: 3.1 mmol/L — ABNORMAL HIGH (ref 0.0–2.0)
Bicarbonate: 26.9 mmol/L (ref 20.0–28.0)
Drawn by: 519031
FIO2: 21
O2 Saturation: 96.9 %
Patient temperature: 36.4
pCO2 arterial: 38.8 mmHg (ref 32.0–48.0)
pH, Arterial: 7.453 — ABNORMAL HIGH (ref 7.350–7.450)
pO2, Arterial: 85.2 mmHg (ref 83.0–108.0)

## 2020-01-29 LAB — PREPARE RBC (CROSSMATCH)

## 2020-01-29 LAB — SURGICAL PCR SCREEN
MRSA, PCR: NEGATIVE
Staphylococcus aureus: NEGATIVE

## 2020-01-29 LAB — TYPE AND SCREEN
ABO/RH(D): O POS
Antibody Screen: NEGATIVE
Unit division: 0

## 2020-01-29 LAB — PROTIME-INR
INR: 1.1 (ref 0.8–1.2)
Prothrombin Time: 13.9 seconds (ref 11.4–15.2)

## 2020-01-29 LAB — ABO/RH: ABO/RH(D): O POS

## 2020-01-29 LAB — NO BLOOD PRODUCTS

## 2020-01-29 LAB — APTT: aPTT: 30 seconds (ref 24–36)

## 2020-01-29 MED ORDER — DEXMEDETOMIDINE HCL IN NACL 400 MCG/100ML IV SOLN
0.1000 ug/kg/h | INTRAVENOUS | Status: AC
  Administered 2020-01-30: .3 ug/kg/h via INTRAVENOUS
  Filled 2020-01-29: qty 100

## 2020-01-29 MED ORDER — PLASMA-LYTE 148 IV SOLN
INTRAVENOUS | Status: DC
  Filled 2020-01-29: qty 2.5

## 2020-01-29 MED ORDER — CHLORHEXIDINE GLUCONATE 0.12 % MT SOLN
15.0000 mL | Freq: Once | OROMUCOSAL | Status: AC
  Administered 2020-01-30: 15 mL via OROMUCOSAL
  Filled 2020-01-29: qty 15

## 2020-01-29 MED ORDER — ATORVASTATIN CALCIUM 10 MG PO TABS
20.0000 mg | ORAL_TABLET | Freq: Every day | ORAL | Status: DC
  Administered 2020-01-29: 20 mg via ORAL
  Filled 2020-01-29: qty 2

## 2020-01-29 MED ORDER — MILRINONE LACTATE IN DEXTROSE 20-5 MG/100ML-% IV SOLN
0.3000 ug/kg/min | INTRAVENOUS | Status: AC
  Administered 2020-01-30: .375 ug/kg/min via INTRAVENOUS
  Filled 2020-01-29: qty 100

## 2020-01-29 MED ORDER — SODIUM CHLORIDE 0.9 % IV SOLN
INTRAVENOUS | Status: DC
  Filled 2020-01-29: qty 30

## 2020-01-29 MED ORDER — SODIUM CHLORIDE 0.9 % IV SOLN
750.0000 mg | INTRAVENOUS | Status: DC
  Filled 2020-01-29: qty 750

## 2020-01-29 MED ORDER — POTASSIUM CHLORIDE 2 MEQ/ML IV SOLN
80.0000 meq | INTRAVENOUS | Status: DC
  Filled 2020-01-29: qty 40

## 2020-01-29 MED ORDER — TRANEXAMIC ACID (OHS) BOLUS VIA INFUSION
15.0000 mg/kg | INTRAVENOUS | Status: AC
  Administered 2020-01-30: 1483.5 mg via INTRAVENOUS
  Filled 2020-01-29: qty 1484

## 2020-01-29 MED ORDER — ASPIRIN EC 81 MG PO TBEC
81.0000 mg | DELAYED_RELEASE_TABLET | Freq: Every day | ORAL | Status: DC
  Administered 2020-01-29: 81 mg via ORAL
  Filled 2020-01-29: qty 1

## 2020-01-29 MED ORDER — TRANEXAMIC ACID 1000 MG/10ML IV SOLN
1.5000 mg/kg/h | INTRAVENOUS | Status: AC
  Administered 2020-01-30: 1.5 mg/kg/h via INTRAVENOUS
  Filled 2020-01-29: qty 25

## 2020-01-29 MED ORDER — NOREPINEPHRINE 4 MG/250ML-% IV SOLN
0.0000 ug/min | INTRAVENOUS | Status: DC
  Filled 2020-01-29: qty 250

## 2020-01-29 MED ORDER — NITROGLYCERIN IN D5W 200-5 MCG/ML-% IV SOLN
2.0000 ug/min | INTRAVENOUS | Status: DC
  Filled 2020-01-29: qty 250

## 2020-01-29 MED ORDER — TEMAZEPAM 7.5 MG PO CAPS
15.0000 mg | ORAL_CAPSULE | Freq: Once | ORAL | Status: AC | PRN
  Administered 2020-01-29: 15 mg via ORAL
  Filled 2020-01-29: qty 2

## 2020-01-29 MED ORDER — CHLORHEXIDINE GLUCONATE CLOTH 2 % EX PADS: 6.0000 | MEDICATED_PAD | Freq: Once | CUTANEOUS | Status: DC

## 2020-01-29 MED ORDER — INSULIN REGULAR(HUMAN) IN NACL 100-0.9 UT/100ML-% IV SOLN
INTRAVENOUS | Status: AC
  Administered 2020-01-30: 1.2 [IU]/h via INTRAVENOUS
  Filled 2020-01-29: qty 100

## 2020-01-29 MED ORDER — EPINEPHRINE HCL 5 MG/250ML IV SOLN IN NS
0.0000 ug/min | INTRAVENOUS | Status: DC
  Filled 2020-01-29: qty 250

## 2020-01-29 MED ORDER — VANCOMYCIN HCL 1500 MG/300ML IV SOLN
1500.0000 mg | INTRAVENOUS | Status: AC
  Administered 2020-01-30: 1500 mg via INTRAVENOUS
  Filled 2020-01-29: qty 300

## 2020-01-29 MED ORDER — BISACODYL 5 MG PO TBEC
5.0000 mg | DELAYED_RELEASE_TABLET | Freq: Once | ORAL | Status: AC
  Administered 2020-01-29: 5 mg via ORAL
  Filled 2020-01-29: qty 1

## 2020-01-29 MED ORDER — PHENYLEPHRINE HCL-NACL 20-0.9 MG/250ML-% IV SOLN
30.0000 ug/min | INTRAVENOUS | Status: AC
  Administered 2020-01-30: 15 ug/min via INTRAVENOUS
  Filled 2020-01-29: qty 250

## 2020-01-29 MED ORDER — SODIUM CHLORIDE 0.9 % IV SOLN
1.5000 g | INTRAVENOUS | Status: AC
  Administered 2020-01-30: 1.5 g via INTRAVENOUS
  Filled 2020-01-29: qty 1.5

## 2020-01-29 MED ORDER — TRANEXAMIC ACID (OHS) PUMP PRIME SOLUTION
2.0000 mg/kg | INTRAVENOUS | Status: DC
  Filled 2020-01-29: qty 1.98

## 2020-01-29 MED ORDER — SPIRONOLACTONE 25 MG PO TABS
25.0000 mg | ORAL_TABLET | Freq: Every day | ORAL | Status: DC
  Administered 2020-01-29: 25 mg via ORAL
  Filled 2020-01-29: qty 1

## 2020-01-29 MED ORDER — CHLORHEXIDINE GLUCONATE CLOTH 2 % EX PADS
6.0000 | MEDICATED_PAD | Freq: Once | CUTANEOUS | Status: AC
  Administered 2020-01-29: 6 via TOPICAL

## 2020-01-29 MED ORDER — METOPROLOL TARTRATE 12.5 MG HALF TABLET
12.5000 mg | ORAL_TABLET | Freq: Once | ORAL | Status: AC
  Administered 2020-01-30: 12.5 mg via ORAL
  Filled 2020-01-29: qty 1

## 2020-01-29 MED ORDER — MANNITOL 20 % IV SOLN
INTRAVENOUS | Status: DC
  Filled 2020-01-29: qty 13

## 2020-01-29 NOTE — Progress Notes (Signed)
Completed blood consent refusal form with the patient. Answered all the question pt refusing albumin and blood product. Informed patient that albumin is considered blood product however its not made from human blood. Patient stated he does not wants albumin or any blood. Paged MD Lightfoot and informed.

## 2020-01-29 NOTE — Progress Notes (Signed)
     [address]       [place] [address]             [phone]       I spoke with Corey Brady today.  He is in agreement to proceeding with surgery.  He refuses to receive any blood products.  He will accept cell-saver, auto-transfusion, and albumin.    He is scheduled for Off-pump CABG X1 tomorrow.  Brennon Otterness Keane Scrape

## 2020-01-29 NOTE — Progress Notes (Addendum)
Advanced Heart Failure Rounding Note  PCP-Cardiologist: No primary care provider on file.   Subjective:    Admission weight 252--->218   Denies SOB. Denies chest pain.    RHC/LHC 01/26/20 Ao = 101/72 (84) LV = 101/16 RA = 7 RV = 44/10 PA = 44/22 (31) PCW = 13 Fick cardiac output/index = 6.0/2.6 PVR = 3.0 WU FA sat = 99% PA sat = 73%, 74% Assessment: 1. Severe 1v CAD with ostially occlusion of LAD with prominent R->L collaterals and good target 2. Ischemic CM with EF 20%  3. Well preserved hemodynamics  Objective:   Weight Range: 98.9 kg Body mass index is 29.58 kg/m.   Vital Signs:   Temp:  [97.6 F (36.4 C)-98.1 F (36.7 C)] 97.6 F (36.4 C) (05/03 0430) Pulse Rate:  [94-98] 98 (05/03 0430) Resp:  [16] 16 (05/03 0430) BP: (106-109)/(70-73) 109/70 (05/03 0430) SpO2:  [96 %-97 %] 96 % (05/03 0430) Weight:  [98.9 kg] 98.9 kg (05/03 0430) Last BM Date: (01/24/2020)  Weight change: Filed Weights   01/27/20 0500 01/28/20 0445 01/29/20 0430  Weight: 103.5 kg 100.5 kg 98.9 kg    Intake/Output:   Intake/Output Summary (Last 24 hours) at 01/29/2020 0751 Last data filed at 01/28/2020 1911 Gross per 24 hour  Intake 460 ml  Output 4450 ml  Net -3990 ml      Physical Exam   General:  Sitting on the side of the bed.  No resp difficulty HEENT: normal Neck: supple. no JVD. Carotids 2+ bilat; no bruits. No lymphadenopathy or thryomegaly appreciated. Cor: PMI nondisplaced. Regular rate & rhythm. No rubs, gallops or murmurs. Lungs: clear Abdomen: soft, nontender, nondistended. No hepatosplenomegaly. No bruits or masses. Good bowel sounds. Extremities: no cyanosis, clubbing, rash,  R and LLE wraps in place.  Neuro: alert & orientedx3, cranial nerves grossly intact. moves all 4 extremities w/o difficulty. Affect pleasant   Telemetry  SR /ST 90-100s   EKG    n/a  Labs    CBC Recent Labs    01/28/20 0619 01/29/20 0531  WBC 6.6 5.6  HGB 15.8 16.2    HCT 47.7 49.3  MCV 91.0 90.6  PLT 235 034   Basic Metabolic Panel Recent Labs    01/28/20 0619 01/29/20 0531  NA 138 138  K 4.9 4.1  CL 100 98  CO2 29 28  GLUCOSE 92 97  BUN 14 18  CREATININE 1.09 1.16  CALCIUM 9.5 9.2   Liver Function Tests No results for input(s): AST, ALT, ALKPHOS, BILITOT, PROT, ALBUMIN in the last 72 hours. No results for input(s): LIPASE, AMYLASE in the last 72 hours. Cardiac Enzymes No results for input(s): CKTOTAL, CKMB, CKMBINDEX, TROPONINI in the last 72 hours.  BNP: BNP (last 3 results) Recent Labs    01/23/20 0338  BNP 1,370.5*    ProBNP (last 3 results) No results for input(s): PROBNP in the last 8760 hours.   D-Dimer No results for input(s): DDIMER in the last 72 hours. Hemoglobin A1C No results for input(s): HGBA1C in the last 72 hours. Fasting Lipid Panel No results for input(s): CHOL, HDL, LDLCALC, TRIG, CHOLHDL, LDLDIRECT in the last 72 hours. Thyroid Function Tests No results for input(s): TSH, T4TOTAL, T3FREE, THYROIDAB in the last 72 hours.  Invalid input(s): FREET3  Other results:   Imaging    No results found.   Medications:     Scheduled Medications: . digoxin  0.125 mg Oral Daily  . enoxaparin (LOVENOX) injection  40 mg Subcutaneous Q24H  . furosemide  40 mg Oral Daily  . losartan  25 mg Oral Daily  . sodium chloride flush  3 mL Intravenous Q12H  . sodium chloride flush  3 mL Intravenous Q12H  . spironolactone  12.5 mg Oral Daily    Infusions: . sodium chloride 250 mL (01/24/20 1034)  . sodium chloride      PRN Medications: sodium chloride, sodium chloride, acetaminophen, ondansetron (ZOFRAN) IV, ondansetron **OR** [DISCONTINUED] ondansetron (ZOFRAN) IV, polyethylene glycol, sodium chloride flush     Assessment/Plan   1. Acute Systolic Heart Failure ECHO EF 15% with severe global HK, and moderate RV dysfunction.  Etiology possible ETOH versus HTN (denies severe ETOH use). TSH was ok. HIV NR   -4/28 LHC with severe 1v CAD. RHC with preserved cardiac output.  - Appears euvolemic. Continue lasix 40 mg po daily. Overall weight down 34 pounds  - Increase spiro 25 mg daily.  - Continue digoxin 0.125 mg daily  - Continue losartan 25 mg daily and will eventually will need entresto  -  2. Cellulits , R and LLE legs - Partial Thickness wound with palpable pedal pulses.  WBC normal. Started keflex 4/27-->Switched  to vancomycin on 4/28 . .  - WOC appreciated - remains afebrile. No evidence of sepsis at this point  3. ? H/O ETOH Abuse - denies heavy use  4. CAD - No chest pain.  -LHC with severe 1VCAD--> TCTS consulted for LIMA to LAD  - CMRI has been completed.  - He is [employer] and will not accept blood products. Surgery will be high risk.  - HGb 16.2  - Add atorvastatin and aspirin  Consult cardiac rehab.   Social - He has not insurance so we will need to provide 30 days of HF meds at discharge.  Discussed with HF Pharmacy and HF SW for med assist. Referred to HF Paramedicine.   He will also need to be set up with PCP.    Length of Stay: 6  Amy Clegg, NP  01/29/2020, 7:51 AM  Advanced Heart Failure Team Pager [phone] (M-F; 7a - 4p)  Please contact [facility] for night-coverage after hours (4p -7a ) and weekends on [email or web address]   Patient seen and examined with the above-signed Advanced Practice Provider and/or Housestaff. I personally reviewed laboratory data, imaging studies and relevant notes. I independently examined the patient and formulated the important aspects of the plan. I have edited the note to reflect any of my changes or salient points. I have personally discussed the plan with the patient and/or family.  Doing well. Volume status looks good. Vitals stable.   cMRI reviewed anterior wall is viable. He is Jehovah's Witness and is not willing to accept blood products. Hgb today 16.2  I reviewed with Dr. Cliffton Asters and plan will be for off-pump  LIMA-LAD tomorrow with the understanding the surgery will be higher risk without the ability to tranfuse RBCS.   General:  Well appearing. No resp difficulty HEENT: normal Neck: supple. no JVD. Carotids 2+ bilat; no bruits. No lymphadenopathy or thryomegaly appreciated. Cor: PMI nondisplaced. Regular rate & rhythm. No rubs, gallops or murmurs. Lungs: clear Abdomen: soft, nontender, nondistended. No hepatosplenomegaly. No bruits or masses. Good bowel sounds. Extremities: no cyanosis, clubbing, rash, edema Neuro: alert & orientedx3, cranial nerves grossly intact. moves all 4 extremities w/o difficulty. Affect pleasant  CABG in am. Titrate meds as above.   Arvilla Meres, MD  12:46 PM

## 2020-01-29 NOTE — Progress Notes (Signed)
Physical Therapy Treatment Patient Details Name: Corey Brady MRN: [id number] DOB: [birth year] Today's Date: 01/29/2020    History of Present Illness Pt is a 64 y/o male admitted secondary to Increased swelling in BLE; likely from CHF exacerbation. PMH includes CHF and gout.     PT Comments    Pt admitted with above diagnosis. Pt was able to ambulate with crutches with fair safety and with good safety with RW. Pt agrees he is steadier with RW. Will continue to progress pt.   Pt currently with functional limitations due to balance and endurance deficits. Pt will benefit from skilled PT to increase their independence and safety with mobility to allow discharge to the venue listed below.     Follow Up Recommendations  No PT follow up     Equipment Recommendations  Rolling walker with 5" wheels    Recommendations for Other Services       Precautions / Restrictions Precautions Precautions: Fall Restrictions Weight Bearing Restrictions: No    Mobility  Bed Mobility Overal bed mobility: Needs Assistance Bed Mobility: Sit to Supine;Supine to Sit     Supine to sit: Supervision Sit to supine: Supervision      Transfers Overall transfer level: Needs assistance Equipment used: Crutches Transfers: Sit to/from Stand Sit to Stand: Min guard;Supervision         General transfer comment: Able to stand up and get crutches to walk wihtout LOB.   Ambulation/Gait Ambulation/Gait assistance: Supervision Gait Distance (Feet): 150 Feet Assistive device: Crutches;Rolling walker (2 wheeled) Gait Pattern/deviations: Step-through pattern;Decreased stride length Gait velocity: Decreased Gait velocity interpretation: 1.31 - 2.62 ft/sec, indicative of limited community ambulator General Gait Details: reciprocal gait pattern with use of crutches with no steadying assist.  Due to pts height, pt BOS quite wide with crutches therefore discussed with pt if he wanted to try the RW to see if he is  interested in possibly getting a RW.  Pt did well with it and no LOB with this and feels a little safer with this as well per pt .    Stairs             Wheelchair Mobility    Modified Rankin (Stroke Patients Only)       Balance Overall balance assessment: Mild deficits observed, not formally tested                                          Cognition Arousal/Alertness: Awake/alert Behavior During Therapy: WFL for tasks assessed/performed Overall Cognitive Status: Within Functional Limits for tasks assessed                                        Exercises      General Comments General comments (skin integrity, edema, etc.): VSS on RA, bilateral LE wrapped in clean, dry compression wrap      Pertinent Vitals/Pain Pain Assessment: No/denies pain    Home Living                      Prior Function            PT Goals (current goals can now be found in the care plan section) Acute Rehab PT Goals Patient Stated Goal: to go home Progress towards PT goals: Progressing toward goals  Frequency    Min 3X/week      PT Plan Equipment recommendations need to be updated    Co-evaluation              AM-PAC PT "6 Clicks" Mobility   Outcome Measure  Help needed turning from your back to your side while in a flat bed without using bedrails?: None Help needed moving from lying on your back to sitting on the side of a flat bed without using bedrails?: A Little Help needed moving to and from a bed to a chair (including a wheelchair)?: None Help needed standing up from a chair using your arms (e.g., wheelchair or bedside chair)?: None Help needed to walk in hospital room?: None Help needed climbing 3-5 steps with a railing? : A Little 6 Click Score: 22    End of Session Equipment Utilized During Treatment: Gait belt Activity Tolerance: Patient tolerated treatment well Patient left: with call bell/phone within  reach;in chair;with chair alarm set Nurse Communication: Mobility status PT Visit Diagnosis: Other abnormalities of gait and mobility (R26.89)     Time: 1120-1140 PT Time Calculation (min) (ACUTE ONLY): 20 min  Charges:  $Gait Training: 8-22 mins                     Donda Friedli W,PT Acute Rehabilitation Services Pager:  [phone]  Office:  [place] 01/29/2020, 1:35 PM

## 2020-01-29 NOTE — Progress Notes (Signed)
Discussed sternal precautions, IS (>2500 mL), post op mobility and d/c planning. Pt receptive. Gave him materials to review. He declined watching preop video. He sts he lives with his brother but he works and sleeps odd hours. His sister can be with him some but will have gaps of supervision at d/c. Encouraged him to discuss all of this with his family. [phone] Ethelda Chick CES, ACSM 2:05 PM 01/29/2020

## 2020-01-29 NOTE — Progress Notes (Signed)
RT NOTES: ABG obtained and sent to lab. Lab tech Gaspar Garbe notified.

## 2020-01-29 NOTE — Plan of Care (Signed)
  Problem: Education: Goal: Knowledge of General Education information will improve Description: Including pain rating scale, medication(s)/side effects and non-pharmacologic comfort measures Outcome: Progressing   Problem: Health Behavior/Discharge Planning: Goal: Ability to manage health-related needs will improve Outcome: Progressing   Problem: Clinical Measurements: Goal: Ability to maintain clinical measurements within normal limits will improve Outcome: Progressing Goal: Will remain free from infection Outcome: Progressing Goal: Diagnostic test results will improve Outcome: Progressing Goal: Respiratory complications will improve Outcome: Progressing Goal: Cardiovascular complication will be avoided Outcome: Progressing   Problem: Activity: Goal: Risk for activity intolerance will decrease Outcome: Progressing   Problem: Skin Integrity: Goal: Risk for impaired skin integrity will decrease Outcome: Progressing   Problem: Education: Goal: Ability to demonstrate management of disease process will improve Outcome: Progressing Goal: Ability to verbalize understanding of medication therapies will improve Outcome: Progressing Goal: Individualized Educational Video(s) Outcome: Progressing   Problem: Activity: Goal: Capacity to carry out activities will improve Outcome: Progressing   Problem: Cardiac: Goal: Ability to achieve and maintain adequate cardiopulmonary perfusion will improve Outcome: Progressing   

## 2020-01-30 ENCOUNTER — Inpatient Hospital Stay (HOSPITAL_COMMUNITY): Payer: Self-pay

## 2020-01-30 ENCOUNTER — Inpatient Hospital Stay (HOSPITAL_COMMUNITY): Payer: Self-pay | Admitting: Certified Registered Nurse Anesthetist

## 2020-01-30 ENCOUNTER — Inpatient Hospital Stay (HOSPITAL_COMMUNITY)
Admission: EM | Disposition: A | Discharge status: Home Health | Payer: Self-pay | Source: Home / Self Care | Attending: Thoracic Surgery (Cardiothoracic Vascular Surgery)

## 2020-01-30 DIAGNOSIS — Z951 Presence of aortocoronary bypass graft: Secondary | ICD-10-CM

## 2020-01-30 DIAGNOSIS — I2510 Atherosclerotic heart disease of native coronary artery without angina pectoris: Secondary | ICD-10-CM

## 2020-01-30 HISTORY — PX: CORONARY ARTERY BYPASS GRAFT: SHX141

## 2020-01-30 HISTORY — PX: TEE WITHOUT CARDIOVERSION: SHX5443

## 2020-01-30 LAB — POCT I-STAT, CHEM 8
BUN: 20 mg/dL (ref 8–23)
BUN: 20 mg/dL (ref 8–23)
BUN: 20 mg/dL (ref 8–23)
Calcium, Ion: 1.25 mmol/L (ref 1.15–1.40)
Calcium, Ion: 1.11 mmol/L — ABNORMAL LOW (ref 1.15–1.40)
Calcium, Ion: 1.27 mmol/L (ref 1.15–1.40)
Chloride: 100 mmol/L (ref 98–111)
Chloride: 101 mmol/L (ref 98–111)
Chloride: 101 mmol/L (ref 98–111)
Creatinine, Ser: 0.7 mg/dL (ref 0.61–1.24)
Creatinine, Ser: 0.7 mg/dL (ref 0.61–1.24)
Creatinine, Ser: 0.7 mg/dL (ref 0.61–1.24)
Glucose, Bld: 95 mg/dL (ref 70–99)
Glucose, Bld: 122 mg/dL — ABNORMAL HIGH (ref 70–99)
Glucose, Bld: 161 mg/dL — ABNORMAL HIGH (ref 70–99)
HCT: 47 % (ref 39.0–52.0)
HCT: 45 % (ref 39.0–52.0)
HCT: 50 % (ref 39.0–52.0)
Hemoglobin: 16 g/dL (ref 13.0–17.0)
Hemoglobin: 15.3 g/dL (ref 13.0–17.0)
Hemoglobin: 17 g/dL (ref 13.0–17.0)
Potassium: 4 mmol/L (ref 3.5–5.1)
Potassium: 4.4 mmol/L (ref 3.5–5.1)
Potassium: 4.9 mmol/L (ref 3.5–5.1)
Sodium: 140 mmol/L (ref 135–145)
Sodium: 137 mmol/L (ref 135–145)
Sodium: 138 mmol/L (ref 135–145)
TCO2: 29 mmol/L (ref 22–32)
TCO2: 30 mmol/L (ref 22–32)
TCO2: 29 mmol/L (ref 22–32)

## 2020-01-30 LAB — BASIC METABOLIC PANEL
Anion gap: 12 (ref 5–15)
Anion gap: 11 (ref 5–15)
BUN: 21 mg/dL (ref 8–23)
BUN: 19 mg/dL (ref 8–23)
CO2: 28 mmol/L (ref 22–32)
CO2: 21 mmol/L — ABNORMAL LOW (ref 22–32)
Calcium: 9.6 mg/dL (ref 8.9–10.3)
Calcium: 8.7 mg/dL — ABNORMAL LOW (ref 8.9–10.3)
Chloride: 99 mmol/L (ref 98–111)
Chloride: 104 mmol/L (ref 98–111)
Creatinine, Ser: 1.07 mg/dL (ref 0.61–1.24)
Creatinine, Ser: 0.87 mg/dL (ref 0.61–1.24)
GFR calc Af Amer: >60 mL/min (ref >60)
GFR calc Af Amer: >60 mL/min (ref >60)
GFR calc non Af Amer: >60 mL/min (ref >60)
GFR calc non Af Amer: >60 mL/min (ref >60)
Glucose, Bld: 102 mg/dL — ABNORMAL HIGH (ref 70–99)
Glucose, Bld: 157 mg/dL — ABNORMAL HIGH (ref 70–99)
Potassium: 5.4 mmol/L — ABNORMAL HIGH (ref 3.5–5.1)
Potassium: 4.6 mmol/L (ref 3.5–5.1)
Sodium: 139 mmol/L (ref 135–145)
Sodium: 136 mmol/L (ref 135–145)

## 2020-01-30 LAB — POCT I-STAT 7, (LYTES, BLD GAS, ICA,H+H)
Acid-Base Excess: 3 mmol/L — ABNORMAL HIGH (ref 0.0–2.0)
Acid-Base Excess: 1 mmol/L (ref 0.0–2.0)
Acid-base deficit: 1 mmol/L (ref 0.0–2.0)
Acid-base deficit: 2 mmol/L (ref 0.0–2.0)
Bicarbonate: 29.3 mmol/L — ABNORMAL HIGH (ref 20.0–28.0)
Bicarbonate: 27.7 mmol/L (ref 20.0–28.0)
Bicarbonate: 24.4 mmol/L (ref 20.0–28.0)
Bicarbonate: 25.1 mmol/L (ref 20.0–28.0)
Calcium, Ion: 1.22 mmol/L (ref 1.15–1.40)
Calcium, Ion: 1.28 mmol/L (ref 1.15–1.40)
Calcium, Ion: 1.23 mmol/L (ref 1.15–1.40)
Calcium, Ion: 1.23 mmol/L (ref 1.15–1.40)
HCT: 48 % (ref 39.0–52.0)
HCT: 47 % (ref 39.0–52.0)
HCT: 47 % (ref 39.0–52.0)
HCT: 48 % (ref 39.0–52.0)
Hemoglobin: 16.3 g/dL (ref 13.0–17.0)
Hemoglobin: 16 g/dL (ref 13.0–17.0)
Hemoglobin: 16 g/dL (ref 13.0–17.0)
Hemoglobin: 16.3 g/dL (ref 13.0–17.0)
O2 Saturation: 100 %
O2 Saturation: 100 %
O2 Saturation: 99 %
O2 Saturation: 99 %
Patient temperature: 35.7
Patient temperature: 36.1
Patient temperature: 36.1
Potassium: 4.8 mmol/L (ref 3.5–5.1)
Potassium: 4.5 mmol/L (ref 3.5–5.1)
Potassium: 4.2 mmol/L (ref 3.5–5.1)
Potassium: 4.2 mmol/L (ref 3.5–5.1)
Sodium: 137 mmol/L (ref 135–145)
Sodium: 138 mmol/L (ref 135–145)
Sodium: 137 mmol/L (ref 135–145)
Sodium: 137 mmol/L (ref 135–145)
TCO2: 31 mmol/L (ref 22–32)
TCO2: 29 mmol/L (ref 22–32)
TCO2: 26 mmol/L (ref 22–32)
TCO2: 27 mmol/L (ref 22–32)
pCO2 arterial: 50.7 mmHg — ABNORMAL HIGH (ref 32.0–48.0)
pCO2 arterial: 48.5 mmHg — ABNORMAL HIGH (ref 32.0–48.0)
pCO2 arterial: 41.9 mmHg (ref 32.0–48.0)
pCO2 arterial: 47.4 mmHg (ref 32.0–48.0)
pH, Arterial: 7.369 (ref 7.350–7.450)
pH, Arterial: 7.359 (ref 7.350–7.450)
pH, Arterial: 7.368 (ref 7.350–7.450)
pH, Arterial: 7.328 — ABNORMAL LOW (ref 7.350–7.450)
pO2, Arterial: 603 mmHg — ABNORMAL HIGH (ref 83.0–108.0)
pO2, Arterial: 187 mmHg — ABNORMAL HIGH (ref 83.0–108.0)
pO2, Arterial: 156 mmHg — ABNORMAL HIGH (ref 83.0–108.0)
pO2, Arterial: 141 mmHg — ABNORMAL HIGH (ref 83.0–108.0)

## 2020-01-30 LAB — CBC
HCT: 51.6 % (ref 39.0–52.0)
HCT: 46.9 % (ref 39.0–52.0)
HCT: 46.7 % (ref 39.0–52.0)
Hemoglobin: 17 g/dL (ref 13.0–17.0)
Hemoglobin: 15.5 g/dL (ref 13.0–17.0)
Hemoglobin: 15.6 g/dL (ref 13.0–17.0)
MCH: 29.9 pg (ref 26.0–34.0)
MCH: 30 pg (ref 26.0–34.0)
MCH: 29.9 pg (ref 26.0–34.0)
MCHC: 32.9 g/dL (ref 30.0–36.0)
MCHC: 33 g/dL (ref 30.0–36.0)
MCHC: 33.4 g/dL (ref 30.0–36.0)
MCV: 90.8 fL (ref 80.0–100.0)
MCV: 90.7 fL (ref 80.0–100.0)
MCV: 89.6 fL (ref 80.0–100.0)
Platelets: 149 10*3/uL — ABNORMAL LOW (ref 150–400)
Platelets: 250 10*3/uL (ref 150–400)
Platelets: 258 10*3/uL (ref 150–400)
RBC: 5.68 MIL/uL (ref 4.22–5.81)
RBC: 5.17 MIL/uL (ref 4.22–5.81)
RBC: 5.21 MIL/uL (ref 4.22–5.81)
RDW: 15.1 % (ref 11.5–15.5)
RDW: 14.8 % (ref 11.5–15.5)
RDW: 14.7 % (ref 11.5–15.5)
WBC: 5.3 10*3/uL (ref 4.0–10.5)
WBC: 9 10*3/uL (ref 4.0–10.5)
WBC: 12.7 10*3/uL — ABNORMAL HIGH (ref 4.0–10.5)
nRBC: 0 % (ref 0.0–0.2)
nRBC: 0 % (ref 0.0–0.2)
nRBC: 0 % (ref 0.0–0.2)

## 2020-01-30 LAB — GLUCOSE, CAPILLARY
Glucose-Capillary: 134 mg/dL — ABNORMAL HIGH (ref 70–99)
Glucose-Capillary: 136 mg/dL — ABNORMAL HIGH (ref 70–99)
Glucose-Capillary: 144 mg/dL — ABNORMAL HIGH (ref 70–99)
Glucose-Capillary: 146 mg/dL — ABNORMAL HIGH (ref 70–99)
Glucose-Capillary: 147 mg/dL — ABNORMAL HIGH (ref 70–99)
Glucose-Capillary: 152 mg/dL — ABNORMAL HIGH (ref 70–99)
Glucose-Capillary: 152 mg/dL — ABNORMAL HIGH (ref 70–99)
Glucose-Capillary: 153 mg/dL — ABNORMAL HIGH (ref 70–99)
Glucose-Capillary: 153 mg/dL — ABNORMAL HIGH (ref 70–99)
Glucose-Capillary: 154 mg/dL — ABNORMAL HIGH (ref 70–99)
Glucose-Capillary: 159 mg/dL — ABNORMAL HIGH (ref 70–99)
Glucose-Capillary: 161 mg/dL — ABNORMAL HIGH (ref 70–99)
Glucose-Capillary: 166 mg/dL — ABNORMAL HIGH (ref 70–99)
Glucose-Capillary: 179 mg/dL — ABNORMAL HIGH (ref 70–99)

## 2020-01-30 LAB — PROTIME-INR
INR: 1.3 — ABNORMAL HIGH (ref 0.8–1.2)
Prothrombin Time: 15.3 seconds — ABNORMAL HIGH (ref 11.4–15.2)

## 2020-01-30 LAB — APTT: aPTT: 29 seconds (ref 24–36)

## 2020-01-30 LAB — MAGNESIUM: Magnesium: 2.5 mg/dL — ABNORMAL HIGH (ref 1.7–2.4)

## 2020-01-30 SURGERY — OFF PUMP CORONARY ARTERY BYPASS GRAFTING (CABG)
Anesthesia: General | Site: Chest

## 2020-01-30 MED ORDER — TRAMADOL HCL 50 MG PO TABS
50.0000 mg | ORAL_TABLET | ORAL | Status: DC | PRN
  Administered 2020-01-30 – 2020-02-01 (×3): 100 mg via ORAL
  Filled 2020-01-30 (×3): qty 2

## 2020-01-30 MED ORDER — VANCOMYCIN HCL IN DEXTROSE 1-5 GM/200ML-% IV SOLN
1000.0000 mg | Freq: Once | INTRAVENOUS | Status: AC
  Administered 2020-01-30: 1000 mg via INTRAVENOUS
  Filled 2020-01-30: qty 200

## 2020-01-30 MED ORDER — DOBUTAMINE IN D5W 4-5 MG/ML-% IV SOLN: 0.0000 ug/kg/min | INTRAVENOUS | Status: DC

## 2020-01-30 MED ORDER — OXYCODONE HCL 5 MG PO TABS
5.0000 mg | ORAL_TABLET | ORAL | Status: DC | PRN
  Administered 2020-01-30 – 2020-01-31 (×2): 10 mg via ORAL
  Filled 2020-01-30 (×2): qty 2

## 2020-01-30 MED ORDER — LIDOCAINE 2% (20 MG/ML) 5 ML SYRINGE
INTRAMUSCULAR | Status: DC | PRN
  Administered 2020-01-30: 60 mg via INTRAVENOUS

## 2020-01-30 MED ORDER — NICARDIPINE HCL IN NACL 20-0.86 MG/200ML-% IV SOLN
0.0000 mg/h | INTRAVENOUS | Status: DC
  Administered 2020-01-31 (×2): 5 mg/h via INTRAVENOUS
  Filled 2020-01-30 (×3): qty 200

## 2020-01-30 MED ORDER — LACTATED RINGERS IV SOLN: INTRAVENOUS | Status: DC

## 2020-01-30 MED ORDER — DEXMEDETOMIDINE HCL IN NACL 400 MCG/100ML IV SOLN: 0.0000 ug/kg/h | INTRAVENOUS | Status: DC

## 2020-01-30 MED ORDER — PROTAMINE SULFATE 10 MG/ML IV SOLN
INTRAVENOUS | Status: AC
  Filled 2020-01-30: qty 15

## 2020-01-30 MED ORDER — ACETAMINOPHEN 160 MG/5ML PO SOLN: 1000.0000 mg | Freq: Four times a day (QID) | ORAL | Status: AC

## 2020-01-30 MED ORDER — SODIUM CHLORIDE 0.9% FLUSH
10.0000 mL | Freq: Two times a day (BID) | INTRAVENOUS | Status: DC
  Administered 2020-01-30 – 2020-02-02 (×5): 10 mL

## 2020-01-30 MED ORDER — ORAL CARE MOUTH RINSE
15.0000 mL | Freq: Two times a day (BID) | OROMUCOSAL | Status: DC
  Administered 2020-01-30 – 2020-02-08 (×17): 15 mL via OROMUCOSAL

## 2020-01-30 MED ORDER — SODIUM CHLORIDE 0.9 % IV SOLN: 250.0000 mL | INTRAVENOUS | Status: DC

## 2020-01-30 MED ORDER — FAMOTIDINE IN NACL 20-0.9 MG/50ML-% IV SOLN
20.0000 mg | Freq: Two times a day (BID) | INTRAVENOUS | Status: AC
  Administered 2020-01-30: 20 mg via INTRAVENOUS
  Filled 2020-01-30: qty 50

## 2020-01-30 MED ORDER — SODIUM CHLORIDE 0.45 % IV SOLN: INTRAVENOUS | Status: DC | PRN

## 2020-01-30 MED ORDER — PANTOPRAZOLE SODIUM 40 MG PO TBEC
40.0000 mg | DELAYED_RELEASE_TABLET | Freq: Every day | ORAL | Status: DC
  Administered 2020-02-01 – 2020-02-08 (×8): 40 mg via ORAL
  Filled 2020-01-30 (×8): qty 1

## 2020-01-30 MED ORDER — ASPIRIN EC 325 MG PO TBEC
325.0000 mg | DELAYED_RELEASE_TABLET | Freq: Every day | ORAL | Status: DC
  Administered 2020-01-31 – 2020-02-03 (×4): 325 mg via ORAL
  Filled 2020-01-30 (×4): qty 1

## 2020-01-30 MED ORDER — PROTAMINE SULFATE 10 MG/ML IV SOLN
INTRAVENOUS | Status: DC | PRN
  Administered 2020-01-30: 30 mg via INTRAVENOUS
  Administered 2020-01-30: 40 mg via INTRAVENOUS
  Administered 2020-01-30 (×2): 30 mg via INTRAVENOUS
  Administered 2020-01-30: 10 mg via INTRAVENOUS

## 2020-01-30 MED ORDER — SODIUM CHLORIDE 0.9% FLUSH: 3.0000 mL | INTRAVENOUS | Status: DC | PRN

## 2020-01-30 MED ORDER — ASPIRIN 81 MG PO CHEW: 324.0000 mg | CHEWABLE_TABLET | Freq: Every day | ORAL | Status: DC

## 2020-01-30 MED ORDER — METOPROLOL TARTRATE 25 MG/10 ML ORAL SUSPENSION: 12.5000 mg | Freq: Two times a day (BID) | ORAL | Status: DC

## 2020-01-30 MED ORDER — MIDAZOLAM HCL 2 MG/2ML IJ SOLN: 2.0000 mg | INTRAMUSCULAR | Status: DC | PRN

## 2020-01-30 MED ORDER — INSULIN REGULAR(HUMAN) IN NACL 100-0.9 UT/100ML-% IV SOLN: INTRAVENOUS | Status: DC

## 2020-01-30 MED ORDER — MORPHINE SULFATE (PF) 2 MG/ML IV SOLN
1.0000 mg | INTRAVENOUS | Status: DC | PRN
  Administered 2020-01-30: 2 mg via INTRAVENOUS
  Filled 2020-01-30: qty 1

## 2020-01-30 MED ORDER — LACTATED RINGERS IV SOLN
500.0000 mL | Freq: Once | INTRAVENOUS | Status: AC | PRN
  Administered 2020-01-30: 500 mL via INTRAVENOUS

## 2020-01-30 MED ORDER — MAGNESIUM SULFATE 4 GM/100ML IV SOLN
4.0000 g | Freq: Once | INTRAVENOUS | Status: AC
  Administered 2020-01-30: 4 g via INTRAVENOUS
  Filled 2020-01-30: qty 100

## 2020-01-30 MED ORDER — FENTANYL CITRATE (PF) 250 MCG/5ML IJ SOLN
INTRAMUSCULAR | Status: AC
  Filled 2020-01-30: qty 25

## 2020-01-30 MED ORDER — MILRINONE LACTATE IN DEXTROSE 20-5 MG/100ML-% IV SOLN
0.1250 ug/kg/min | INTRAVENOUS | Status: DC
  Administered 2020-01-30 – 2020-01-31 (×3): 0.375 ug/kg/min via INTRAVENOUS
  Administered 2020-01-31: 0.25 ug/kg/min via INTRAVENOUS
  Administered 2020-02-01: 16:00:00 0.125 ug/kg/min via INTRAVENOUS
  Filled 2020-01-30 (×3): qty 100
  Filled 2020-01-30: qty 200
  Filled 2020-01-30: qty 100

## 2020-01-30 MED ORDER — ACETAMINOPHEN 500 MG PO TABS
1000.0000 mg | ORAL_TABLET | Freq: Four times a day (QID) | ORAL | Status: AC
  Administered 2020-01-30 – 2020-02-04 (×19): 1000 mg via ORAL
  Filled 2020-01-30 (×19): qty 2

## 2020-01-30 MED ORDER — PROPOFOL 10 MG/ML IV BOLUS
INTRAVENOUS | Status: DC | PRN
  Administered 2020-01-30: 20 mg via INTRAVENOUS

## 2020-01-30 MED ORDER — BISACODYL 5 MG PO TBEC
10.0000 mg | DELAYED_RELEASE_TABLET | Freq: Every day | ORAL | Status: DC
  Administered 2020-01-31 – 2020-02-08 (×4): 10 mg via ORAL
  Filled 2020-01-30 (×5): qty 2

## 2020-01-30 MED ORDER — METOPROLOL TARTRATE 12.5 MG HALF TABLET
12.5000 mg | ORAL_TABLET | Freq: Two times a day (BID) | ORAL | Status: DC
  Administered 2020-01-31 – 2020-02-01 (×4): 12.5 mg via ORAL
  Filled 2020-01-30 (×4): qty 1

## 2020-01-30 MED ORDER — MIDAZOLAM HCL 5 MG/5ML IJ SOLN
INTRAMUSCULAR | Status: DC | PRN
  Administered 2020-01-30: 2 mg via INTRAVENOUS
  Administered 2020-01-30 (×4): 1 mg via INTRAVENOUS

## 2020-01-30 MED ORDER — ACETAMINOPHEN 650 MG RE SUPP
650.0000 mg | Freq: Once | RECTAL | Status: AC
  Administered 2020-01-30: 650 mg via RECTAL

## 2020-01-30 MED ORDER — BISACODYL 10 MG RE SUPP: 10.0000 mg | Freq: Every day | RECTAL | Status: DC

## 2020-01-30 MED ORDER — SODIUM CHLORIDE 0.9% FLUSH: 10.0000 mL | INTRAVENOUS | Status: DC | PRN

## 2020-01-30 MED ORDER — CHLORHEXIDINE GLUCONATE 0.12 % MT SOLN
15.0000 mL | OROMUCOSAL | Status: AC
  Administered 2020-01-30: 15 mL via OROMUCOSAL
  Filled 2020-01-30: qty 15

## 2020-01-30 MED ORDER — ALBUMIN HUMAN 5 % IV SOLN: 250.0000 mL | INTRAVENOUS | Status: AC | PRN

## 2020-01-30 MED ORDER — LACTATED RINGERS IV SOLN: INTRAVENOUS | Status: DC | PRN

## 2020-01-30 MED ORDER — ROCURONIUM BROMIDE 10 MG/ML (PF) SYRINGE
PREFILLED_SYRINGE | INTRAVENOUS | Status: DC | PRN
  Administered 2020-01-30: 80 mg via INTRAVENOUS
  Administered 2020-01-30: 20 mg via INTRAVENOUS
  Administered 2020-01-30: 50 mg via INTRAVENOUS
  Administered 2020-01-30: 30 mg via INTRAVENOUS

## 2020-01-30 MED ORDER — CHLORHEXIDINE GLUCONATE CLOTH 2 % EX PADS
6.0000 | MEDICATED_PAD | Freq: Every day | CUTANEOUS | Status: DC
  Administered 2020-01-30 – 2020-02-02 (×4): 6 via TOPICAL

## 2020-01-30 MED ORDER — SODIUM CHLORIDE 0.9% FLUSH
3.0000 mL | Freq: Two times a day (BID) | INTRAVENOUS | Status: DC
  Administered 2020-01-31 – 2020-02-01 (×3): 3 mL via INTRAVENOUS

## 2020-01-30 MED ORDER — DOCUSATE SODIUM 100 MG PO CAPS
200.0000 mg | ORAL_CAPSULE | Freq: Every day | ORAL | Status: DC
  Administered 2020-01-31 – 2020-02-08 (×7): 200 mg via ORAL
  Filled 2020-01-30 (×8): qty 2

## 2020-01-30 MED ORDER — MIDAZOLAM HCL (PF) 10 MG/2ML IJ SOLN
INTRAMUSCULAR | Status: AC
  Filled 2020-01-30: qty 2

## 2020-01-30 MED ORDER — CHLORHEXIDINE GLUCONATE 0.12% ORAL RINSE (MEDLINE KIT): 15.0000 mL | Freq: Two times a day (BID) | OROMUCOSAL | Status: DC

## 2020-01-30 MED ORDER — ONDANSETRON HCL 4 MG/2ML IJ SOLN: 4.0000 mg | Freq: Four times a day (QID) | INTRAMUSCULAR | Status: DC | PRN

## 2020-01-30 MED ORDER — PLASMA-LYTE 148 IV SOLN
INTRAVENOUS | Status: DC | PRN
  Administered 2020-01-30: 500 mL

## 2020-01-30 MED ORDER — SODIUM CHLORIDE 0.9 % IV SOLN
1.5000 g | Freq: Two times a day (BID) | INTRAVENOUS | Status: AC
  Administered 2020-01-30 – 2020-02-01 (×4): 1.5 g via INTRAVENOUS
  Filled 2020-01-30 (×4): qty 1.5

## 2020-01-30 MED ORDER — METOPROLOL TARTRATE 5 MG/5ML IV SOLN
2.5000 mg | INTRAVENOUS | Status: DC | PRN
  Administered 2020-02-02: 06:00:00 5 mg via INTRAVENOUS
  Filled 2020-01-30 (×3): qty 5

## 2020-01-30 MED ORDER — POTASSIUM CHLORIDE 10 MEQ/50ML IV SOLN: 10.0000 meq | INTRAVENOUS | Status: AC

## 2020-01-30 MED ORDER — DEXTROSE 50 % IV SOLN: 0.0000 mL | INTRAVENOUS | Status: DC | PRN

## 2020-01-30 MED ORDER — HEMOSTATIC AGENTS (NO CHARGE) OPTIME
TOPICAL | Status: DC | PRN
  Administered 2020-01-30 (×5): 1 via TOPICAL

## 2020-01-30 MED ORDER — HEPARIN SODIUM (PORCINE) 1000 UNIT/ML IJ SOLN
INTRAMUSCULAR | Status: DC | PRN
  Administered 2020-01-30: 14000 [IU] via INTRAVENOUS

## 2020-01-30 MED ORDER — DOBUTAMINE IN D5W 4-5 MG/ML-% IV SOLN
INTRAVENOUS | Status: DC | PRN
  Administered 2020-01-30: 5 ug/kg/min via INTRAVENOUS

## 2020-01-30 MED ORDER — SODIUM CHLORIDE 0.9 % IV SOLN: INTRAVENOUS | Status: DC

## 2020-01-30 MED ORDER — PROPOFOL 10 MG/ML IV BOLUS
INTRAVENOUS | Status: AC
  Filled 2020-01-30: qty 20

## 2020-01-30 MED ORDER — ORAL CARE MOUTH RINSE: 15.0000 mL | OROMUCOSAL | Status: DC

## 2020-01-30 MED ORDER — ACETAMINOPHEN 160 MG/5ML PO SOLN: 650.0000 mg | Freq: Once | ORAL | Status: AC

## 2020-01-30 MED ORDER — NOREPINEPHRINE 4 MG/250ML-% IV SOLN
0.0000 ug/min | INTRAVENOUS | Status: DC
  Administered 2020-01-30: 4 ug/min via INTRAVENOUS
  Administered 2020-01-30: 2 ug/min via INTRAVENOUS
  Filled 2020-01-30: qty 250

## 2020-01-30 MED ORDER — NITROGLYCERIN IN D5W 200-5 MCG/ML-% IV SOLN: 0.0000 ug/min | INTRAVENOUS | Status: DC

## 2020-01-30 MED ORDER — FENTANYL CITRATE (PF) 100 MCG/2ML IJ SOLN
INTRAMUSCULAR | Status: DC | PRN
  Administered 2020-01-30: 100 ug via INTRAVENOUS
  Administered 2020-01-30: 50 ug via INTRAVENOUS
  Administered 2020-01-30: 300 ug via INTRAVENOUS
  Administered 2020-01-30 (×2): 50 ug via INTRAVENOUS
  Administered 2020-01-30: 100 ug via INTRAVENOUS

## 2020-01-30 SURGICAL SUPPLY — 104 items
BAG DECANTER FOR FLEXI CONT (MISCELLANEOUS) ×4 IMPLANT
BLADE CLIPPER SURG (BLADE) ×4 IMPLANT
BLADE STERNUM SYSTEM 6 (BLADE) ×4 IMPLANT
BLOWER MISTER CAL-MED (MISCELLANEOUS) ×4 IMPLANT
BNDG ELASTIC 4X5.8 VLCR STR LF (GAUZE/BANDAGES/DRESSINGS) ×4 IMPLANT
BNDG ELASTIC 6X5.8 VLCR STR LF (GAUZE/BANDAGES/DRESSINGS) IMPLANT
BNDG GAUZE ELAST 4 BULKY (GAUZE/BANDAGES/DRESSINGS) ×4 IMPLANT
CABLE SURGICAL S-101-97-12 (CABLE) ×4 IMPLANT
CANISTER SUCT 3000ML PPV (MISCELLANEOUS) ×4 IMPLANT
CANNULA EZ GLIDE 8.0 24FR (CANNULA) IMPLANT
CANNULA MC2 2 STG 29/37 NON-V (CANNULA) IMPLANT
CANNULA MC2 TWO STAGE (CANNULA)
CATH CPB KIT HENDRICKSON (MISCELLANEOUS) IMPLANT
CLIP RETRACTION 3.0MM CORONARY (MISCELLANEOUS) IMPLANT
CLIP VESOCCLUDE MED 24/CT (CLIP) IMPLANT
CLIP VESOCCLUDE SM WIDE 24/CT (CLIP) IMPLANT
CONN ST 1/2X1/2  BEN (MISCELLANEOUS)
CONN ST 1/2X1/2 BEN (MISCELLANEOUS) IMPLANT
CONNECTOR BLAKE 2:1 CARIO BLK (MISCELLANEOUS) ×4 IMPLANT
DRAIN CHANNEL 19F RND (DRAIN) ×8 IMPLANT
DRAPE CARDIOVASCULAR INCISE (DRAPES) ×4
DRAPE INCISE IOBAN 66X45 STRL (DRAPES) ×4 IMPLANT
DRAPE SLUSH/WARMER DISC (DRAPES) ×4 IMPLANT
DRAPE SRG 135X102X78XABS (DRAPES) ×2 IMPLANT
DRSG COVADERM 4X14 (GAUZE/BANDAGES/DRESSINGS) ×4 IMPLANT
DRSG COVADERM 4X6 (GAUZE/BANDAGES/DRESSINGS) ×4 IMPLANT
DRSG COVADERM 4X8 (GAUZE/BANDAGES/DRESSINGS) ×4 IMPLANT
ELECT BLADE 4.0 EZ CLEAN MEGAD (MISCELLANEOUS) ×4
ELECT REM PT RETURN 9FT ADLT (ELECTROSURGICAL) ×8
ELECTRODE BLDE 4.0 EZ CLN MEGD (MISCELLANEOUS) ×2 IMPLANT
ELECTRODE REM PT RTRN 9FT ADLT (ELECTROSURGICAL) ×4 IMPLANT
FELT TEFLON 1X6 (MISCELLANEOUS) ×4 IMPLANT
GAUZE SPONGE 4X4 12PLY STRL (GAUZE/BANDAGES/DRESSINGS) ×4 IMPLANT
GAUZE SPONGE 4X4 12PLY STRL LF (GAUZE/BANDAGES/DRESSINGS) ×4 IMPLANT
GLOVE BIO SURGEON STRL SZ 6.5 (GLOVE) ×12 IMPLANT
GLOVE BIO SURGEON STRL SZ7 (GLOVE) ×8 IMPLANT
GLOVE BIO SURGEON STRL SZ7.5 (GLOVE) ×8 IMPLANT
GLOVE BIO SURGEONS STRL SZ 6.5 (GLOVE) ×4
GLOVE BIOGEL PI IND STRL 7.5 (GLOVE) IMPLANT
GLOVE BIOGEL PI INDICATOR 7.5 (GLOVE)
GOWN STRL REUS W/ TWL LRG LVL3 (GOWN DISPOSABLE) ×8 IMPLANT
GOWN STRL REUS W/ TWL XL LVL3 (GOWN DISPOSABLE) ×2 IMPLANT
GOWN STRL REUS W/TWL LRG LVL3 (GOWN DISPOSABLE) ×16
GOWN STRL REUS W/TWL XL LVL3 (GOWN DISPOSABLE) ×4
HEMOSTAT POWDER SURGIFOAM 1G (HEMOSTASIS) ×20 IMPLANT
KIT BASIN OR (CUSTOM PROCEDURE TRAY) ×4 IMPLANT
KIT SUCTION CATH 14FR (SUCTIONS) IMPLANT
KIT TURNOVER KIT B (KITS) ×4 IMPLANT
KIT VASOVIEW HEMOPRO 2 VH 4000 (KITS) IMPLANT
LEAD PACING MYOCARDI (MISCELLANEOUS) IMPLANT
MARKER GRAFT CORONARY BYPASS (MISCELLANEOUS) IMPLANT
NS IRRIG 1000ML POUR BTL (IV SOLUTION) ×20 IMPLANT
OFFPUMP STABILIZER SUV (MISCELLANEOUS) ×4 IMPLANT
PACK E OPEN HEART (SUTURE) ×4 IMPLANT
PACK OPEN HEART (CUSTOM PROCEDURE TRAY) ×4 IMPLANT
PAD ARMBOARD 7.5X6 YLW CONV (MISCELLANEOUS) ×8 IMPLANT
PAD ELECT DEFIB RADIOL ZOLL (MISCELLANEOUS) ×4 IMPLANT
PENCIL BUTTON HOLSTER BLD 10FT (ELECTRODE) IMPLANT
POSITIONER ACROBAT-I OFFPUMP (MISCELLANEOUS) IMPLANT
POSITIONER HEAD DONUT 9IN (MISCELLANEOUS) ×4 IMPLANT
PUNCH AORTIC ROTATE 4.0MM (MISCELLANEOUS) IMPLANT
PUNCH AORTIC ROTATE 4.5MM 8IN (MISCELLANEOUS) IMPLANT
PUNCH AORTIC ROTATE 5MM 8IN (MISCELLANEOUS) IMPLANT
SHUNT FLO COIL 1.50MM (MISCELLANEOUS) ×4 IMPLANT
SHUNT FLO COIL 2.00MM (MISCELLANEOUS) ×4 IMPLANT
SPONGE LAP 18X18 RF (DISPOSABLE) IMPLANT
SPONGE LAP 4X18 RFD (DISPOSABLE) IMPLANT
SUPPORT HEART JANKE-BARRON (MISCELLANEOUS) IMPLANT
SUT BONE WAX W31G (SUTURE) IMPLANT
SUT MNCRL AB 3-0 PS2 18 (SUTURE) ×8 IMPLANT
SUT PDS AB 1 CTX 36 (SUTURE) ×8 IMPLANT
SUT PROLENE 2 0 SH DA (SUTURE) IMPLANT
SUT PROLENE 3 0 SH DA (SUTURE) IMPLANT
SUT PROLENE 3 0 SH1 36 (SUTURE) IMPLANT
SUT PROLENE 4 0 RB 1 (SUTURE)
SUT PROLENE 4 0 SH DA (SUTURE) ×4 IMPLANT
SUT PROLENE 4-0 RB1 .5 CRCL 36 (SUTURE) IMPLANT
SUT PROLENE 5 0 C 1 36 (SUTURE) IMPLANT
SUT PROLENE 6 0 C 1 30 (SUTURE) IMPLANT
SUT PROLENE 7 0 BV1 MDA (SUTURE) ×4 IMPLANT
SUT PROLENE 8 0 BV175 6 (SUTURE) IMPLANT
SUT PROLENE BLUE 7 0 (SUTURE) ×4 IMPLANT
SUT PROLENE POLY MONO (SUTURE) IMPLANT
SUT SILK 2 0 SH CR/8 (SUTURE) ×8 IMPLANT
SUT SILK 3 0 SH CR/8 (SUTURE) ×4 IMPLANT
SUT STEEL 6MS V (SUTURE) IMPLANT
SUT STEEL STERNAL CCS#1 18IN (SUTURE) ×12 IMPLANT
SUT VIC AB 1 CTX 36 (SUTURE) ×4
SUT VIC AB 1 CTX36XBRD ANBCTR (SUTURE) ×2 IMPLANT
SUT VIC AB 2-0 CTX 36 (SUTURE) ×4 IMPLANT
SYSTEM HEARTSTRING SEAL 3.8 (VASCULAR PRODUCTS) ×2 IMPLANT
SYSTEM HEARTSTRING SEAL 3.8MM (VASCULAR PRODUCTS) ×4
SYSTEM HEARTSTRING SEAL 4.3 (VASCULAR PRODUCTS) IMPLANT
SYSTEM HEARTSTRING SEAL 4.3MM (VASCULAR PRODUCTS)
SYSTEM SAHARA CHEST DRAIN ATS (WOUND CARE) ×4 IMPLANT
TAPE CLOTH SURG 4X10 WHT LF (GAUZE/BANDAGES/DRESSINGS) ×4 IMPLANT
TAPE PAPER 3X10 WHT MICROPORE (GAUZE/BANDAGES/DRESSINGS) ×4 IMPLANT
TAPES RETRACTO (MISCELLANEOUS) ×4 IMPLANT
TOWEL GREEN STERILE (TOWEL DISPOSABLE) ×4 IMPLANT
TOWEL GREEN STERILE FF (TOWEL DISPOSABLE) ×4 IMPLANT
TRAY FOLEY SLVR 16FR TEMP STAT (SET/KITS/TRAYS/PACK) ×4 IMPLANT
TUBING LAP HI FLOW INSUFFLATIO (TUBING) IMPLANT
UNDERPAD 30X30 (UNDERPADS AND DIAPERS) ×4 IMPLANT
WATER STERILE IRR 1000ML POUR (IV SOLUTION) ×8 IMPLANT

## 2020-01-30 NOTE — Anesthesia Procedure Notes (Signed)
Procedure Name: Intubation Date/Time: 01/30/2020 7:57 AM Performed by: Glynda Jaeger, CRNA Pre-anesthesia Checklist: Patient identified, Patient being monitored, Timeout performed, Emergency Drugs available and Suction available Patient Re-evaluated:Patient Re-evaluated prior to induction Oxygen Delivery Method: Circle System Utilized Preoxygenation: Pre-oxygenation with 100% oxygen Induction Type: IV induction Ventilation: Mask ventilation without difficulty Laryngoscope Size: Mac and 4 Grade View: Grade II Tube type: Oral Tube size: 8.0 mm Number of attempts: 1 Airway Equipment and Method: Stylet Placement Confirmation: ETT inserted through vocal cords under direct vision,  positive ETCO2 and breath sounds checked- equal and bilateral Secured at: 21 cm Tube secured with: Tape Dental Injury: Teeth and Oropharynx as per pre-operative assessment

## 2020-01-30 NOTE — Transfer of Care (Signed)
Immediate Anesthesia Transfer of Care Note  Patient: Corey Brady  Procedure(s) Performed: OFF PUMP CORONARY ARTERY BYPASS GRAFTING (CABG) TIMES ONE (N/A Chest) TRANSESOPHAGEAL ECHOCARDIOGRAM (TEE) (N/A )  Patient Location: ICU  Anesthesia Type:General  Level of Consciousness: Patient remains intubated per anesthesia plan  Airway & Oxygen Therapy: Patient remains intubated per anesthesia plan and Patient placed on Ventilator (see vital sign flow sheet for setting)  Post-op Assessment: Report given to RN and Post -op Vital signs reviewed and stable  Post vital signs: Reviewed and stable  Last Vitals:  Vitals Value Taken Time  BP 104/63 01/30/20 1108  Temp    Pulse 92 01/30/20 1108  Resp 19 01/30/20 1108  SpO2 100 % 01/30/20 1108    Last Pain:  Vitals:   01/30/20 0352  TempSrc: Oral  PainSc:          Complications: No apparent anesthesia complications

## 2020-01-30 NOTE — Brief Op Note (Signed)
01/23/2020 - 01/30/2020  7:46 AM  PATIENT:  Corey Brady  64 y.o. male  PRE-OPERATIVE DIAGNOSIS:  CORONARY ARTERY DISEASE  POST-OPERATIVE DIAGNOSIS:  CORONARY ARTERY DISEASE  PROCEDURE:  Procedure(s) with comments: OFF PUMP CORONARY ARTERY BYPASS GRAFTING (CABG) TIMES ONE (N/A) - swan only TRANSESOPHAGEAL ECHOCARDIOGRAM (TEE) (N/A) LIMA-LAD  SURGEON:  Surgeon(s) and Role:    * Lightfoot, Eliezer Lofts, MD - Primary  PHYSICIAN ASSISTANT: Alvetta Hidrogo PA-C  ASSISTANTS: OR STAFF   ANESTHESIA:   general  EBL:  350 mL   BLOOD ADMINISTERED:none  DRAINS: MEDIASTINAL AND LEFT PLEURAL DRAINS   LOCAL MEDICATIONS USED:  NONE  SPECIMEN:  No Specimen  DISPOSITION OF SPECIMEN:  N/A  COUNTS:  YES  TOURNIQUET:  * No tourniquets in log *  DICTATION: .Dragon Dictation  PLAN OF CARE: Admit to inpatient   PATIENT DISPOSITION:  ICU - intubated and hemodynamically stable.   Delay start of Pharmacological VTE agent (>24hrs) due to surgical blood loss or risk of bleeding: yes  COMPLICATIONS: NON KNOWN

## 2020-01-30 NOTE — Op Note (Signed)
     [address]       [place] [address]             [phone]                                         01/30/2020    Patient:  Mina Marble Pre-Op Dx: Congestive heart failure with an EF of 20%   One-vessel coronary artery disease Post-op Dx: Same Procedure: Off pump CABG X 1, LIMA LAD Intra-operative Transesophageal Echocardiogram  Surgeon and Role:      * Tata Timmins, Eliezer Lofts, MD - Primary    *Gershon Crane, PA-C- assisting  Anesthesia  general EBL: 250 ml Blood Administration: None  Drains: 19 F blake drain: L, mediastinal  Wires: None Counts: correct   Indications: 64 year old male with a history of severe congestive heart failure with an EF of 20%.  He underwent a left heart catheterization which showed a completely occluded LAD vessel with retrograde filling.  He also underwent a cardiac MRI which showed viability of his anterior wall.  CTS was consulted for surgical revascularization.  Of note the patient is also a Jehovah's Witness, but was able to accept Cell Saver.  Findings: Good LAD, good LIMA vessel.  Post bypass he did have slight elevation in his pulmonary artery pressures, and was placed on milrinone.  Operative Technique: After the risks, benefits and alternatives were thoroughly discussed, the patient was brought to the operative theatre.  All invasive lines were placed in pre-op holding.  Anesthesia was induced, and the patient was prepped and draped in normal sterile fashion.  An appropriate surgical pause was performed, and pre-operative antibiotics were dosed accordingly.  We began with an incision along the chest for the sternotomy.  This was carried down with bovie cautery, and the sternum was divided with a reciprocating saw.  Meticulous hemostasis was obtained.  The left internal thoracic artery was exposed and harvested in in pedicled fashion.  The patient was systemically heparinized, and the artery was divided distally, and placed in  a papaverine sponge.    The sternal elevator was removed, and a retractor was placed.  The pericardium was divided in the midline and fashioned into a cradle with pericardial stitches.   We exposed the anterior wall of the heart and identified a good target on the LAD, and fashioned an end to side anastomosis between it and the LITA.  Meticulous hemostasis was obtained.    The heparin was reversed with protamine.  Chest tubes and wires were placed, and the sternum was re-approximated with with sternal wires.  The soft tissue and skin were re-approximated wth absorbable suture.    The patient tolerated the procedure without any immediate complications, and was transferred to the ICU in guarded condition.  Aengus Sauceda Keane Scrape

## 2020-01-30 NOTE — Anesthesia Procedure Notes (Signed)
Central Venous Catheter Insertion Performed by: Val Eagle, MD, anesthesiologist Start/End5/12/2019 6:40 AM, 01/30/2020 6:50 AM Patient location: Pre-op. Preanesthetic checklist: patient identified, IV checked, site marked, risks and benefits discussed, surgical consent, monitors and equipment checked, pre-op evaluation, timeout performed and anesthesia consent Hand hygiene performed  and maximum sterile barriers used  PA cath was placed.Swan type:thermodilution Procedure performed without using ultrasound guided technique. Attempts: 1 Patient tolerated the procedure well with no immediate complications.

## 2020-01-30 NOTE — Progress Notes (Signed)
1st call 1020 2nd call 1041 Last call 1052

## 2020-01-30 NOTE — Progress Notes (Signed)
  Echocardiogram Echocardiogram Transesophageal has been performed.  Leta Jungling M 01/30/2020, 8:23 AM

## 2020-01-30 NOTE — Anesthesia Procedure Notes (Signed)
Central Venous Catheter Insertion Performed by: Oleta Mouse, MD, anesthesiologist Start/End5/12/2019 6:40 AM, 01/30/2020 6:50 AM Patient location: Pre-op. Preanesthetic checklist: patient identified, IV checked, site marked, risks and benefits discussed, surgical consent, monitors and equipment checked, pre-op evaluation, timeout performed and anesthesia consent Lidocaine 1% used for infiltration and patient sedated Hand hygiene performed  and maximum sterile barriers used  Catheter size: 9 Fr Total catheter length 10. MAC introducer Procedure performed using ultrasound guided technique. Ultrasound Notes:anatomy identified, needle tip was noted to be adjacent to the nerve/plexus identified, no ultrasound evidence of intravascular and/or intraneural injection and image(s) printed for medical record Attempts: 1 Following insertion, line sutured, dressing applied and Biopatch. Post procedure assessment: blood return through all ports, free fluid flow and no air  Patient tolerated the procedure well with no immediate complications.

## 2020-01-30 NOTE — Procedures (Signed)
Extubation Procedure Note  Patient Details:   Name: Corey Brady DOB: July 27, 1956 MRN: [id number]   Airway Documentation:  Airway 8 mm (Active)  Secured at (cm) 23 cm 01/30/20 1108  Measured From Lips 01/30/20 1108  Secured Location Right 01/30/20 1108  Secured By Pink Tape 01/30/20 1108  Site Condition Dry 01/30/20 1108   Vent end date: 01/30/20 Vent end time: 1408   Evaluation  O2 sats: stable throughout Complications: No apparent complications Patient did tolerate procedure well. Bilateral Breath Sounds: Clear, Diminished   Yes   Pt extubated to 4L [place]. VC was .94 and NIF was greater than -20. Cuff leak positive, pt able to voice his name.  Guss Bunde 01/30/2020, 2:13 PM

## 2020-01-30 NOTE — Progress Notes (Signed)
     [address]       [place] [address]             [phone]      No events Vitals:   01/30/20 0719 01/30/20 0720  BP:    Pulse: 92 92  Resp: 16 16  Temp:    SpO2: 99% 100%   Alert NAD Sinus EWOB  64 yo JW pt, with CHF, and LAD disease OR today for CABG 1 Pt will only accept cell saver  Ellina Sivertsen O Rayelynn Loyal

## 2020-01-30 NOTE — Anesthesia Procedure Notes (Signed)
Arterial Line Insertion Start/End5/12/2019 6:40 AM, 01/30/2020 6:45 AM Performed by: Margarita Rana, CRNA, CRNA  Preanesthetic checklist: patient identified, IV checked, site marked, risks and benefits discussed, surgical consent, monitors and equipment checked, pre-op evaluation, timeout performed and anesthesia consent Lidocaine 1% used for infiltration Left, radial was placed Catheter size: 20 G Hand hygiene performed  and maximum sterile barriers used  Allen's test indicative of satisfactory collateral circulation Attempts: 1 Procedure performed without using ultrasound guided technique. Following insertion, dressing applied and Biopatch. Post procedure assessment: normal  Patient tolerated the procedure well with no immediate complications.

## 2020-01-30 NOTE — Anesthesia Preprocedure Evaluation (Signed)
Anesthesia Evaluation  Patient identified by MRN, date of birth, ID band Patient awake    Reviewed: Allergy & Precautions, NPO status , Patient's Chart, lab work & pertinent test results  History of Anesthesia Complications Negative for: history of anesthetic complications  Airway Mallampati: III  TM Distance: >3 FB Neck ROM: Full    Dental  (+) Dental Advisory Given, Teeth Intact   Pulmonary shortness of breath, neg sleep apnea, neg COPD, neg recent URI,    breath sounds clear to auscultation       Cardiovascular + CAD, + Past MI and +CHF   Rhythm:Regular  1. Severe global reduction in LV systolic function; restrictive filling;  mild LVE; biatrial enlargement; moderate RV dysfunction; mild MR.  2. Left ventricular ejection fraction, by estimation, is <20%. The left  ventricle has severely decreased function. The left ventricle demonstrates  global hypokinesis. The left ventricular internal cavity size was mildly  dilated. Left ventricular  diastolic parameters are consistent with Grade III diastolic dysfunction  (restrictive).  3. Right ventricular systolic function is moderately reduced. The right  ventricular size is normal. There is moderately elevated pulmonary artery  systolic pressure.  4. Left atrial size was severely dilated.  5. Right atrial size was mildly dilated.  6. The mitral valve is normal in structure. Mild mitral valve  regurgitation. No evidence of mitral stenosis.  7. The aortic valve is tricuspid. Aortic valve regurgitation is not  visualized. Mild aortic valve sclerosis is present, with no evidence of  aortic valve stenosis.  8. The inferior vena cava is dilated in size with <50% respiratory  variability, suggesting right atrial pressure of 15 mmHg.  1. Severe 1v CAD with ostially occlusion of LAD with prominent R->L collaterals and good target 2. Ischemic CM with EF 20%  3. Well preserved  hemodynamics    Neuro/Psych    GI/Hepatic negative GI ROS, Neg liver ROS,   Endo/Other  negative endocrine ROS  Renal/GU negative Renal ROS     Musculoskeletal   Abdominal   Peds  Hematology  (+) Blood dyscrasia, , JEHOVAH'S WITNESSWill accept cell saver, refused all other products even if needed to save his life.    Anesthesia Other Findings   Reproductive/Obstetrics                            Anesthesia Physical Anesthesia Plan  ASA: IV  Anesthesia Plan: General   Post-op Pain Management:    Induction: Intravenous  PONV Risk Score and Plan: 2 and Treatment may vary due to age or medical condition  Airway Management Planned: Oral ETT  Additional Equipment: Arterial line, CVP, PA Cath, TEE and Ultrasound Guidance Line Placement  Intra-op Plan:   Post-operative Plan: Post-operative intubation/ventilation  Informed Consent: I have reviewed the patients History and Physical, chart, labs and discussed the procedure including the risks, benefits and alternatives for the proposed anesthesia with the patient or authorized representative who has indicated his/her understanding and acceptance.     Dental advisory given  Plan Discussed with: CRNA and Surgeon  Anesthesia Plan Comments:         Anesthesia Quick Evaluation

## 2020-01-30 NOTE — Progress Notes (Signed)
      [address]       [place] [address]             [phone]      S/p OPCAB x 1 'Extubated BP 102/68   Pulse (!) 109   Temp (!) 97.5 F (36.4 C)   Resp (!) 27   Ht 6' (1.829 m)   Wt 99.3 kg   SpO2 95%   BMI 29.70 kg/m  29/19 CI= 2.2 On dobutamine @ 5, milrinone 0.375, norepi @ 6   Intake/Output Summary (Last 24 hours) at 01/30/2020 1856 Last data filed at 01/30/2020 1800 Gross per 24 hour  Intake 2304.68 ml  Output 2050 ml  Net 254.68 ml   Minimal CT output K= 4.6, Hct= 46  Mazell Aylesworth C. Dorris Fetch, MD [facility] [phone]

## 2020-01-31 ENCOUNTER — Inpatient Hospital Stay (HOSPITAL_COMMUNITY): Payer: Self-pay

## 2020-01-31 DIAGNOSIS — Z951 Presence of aortocoronary bypass graft: Secondary | ICD-10-CM

## 2020-01-31 LAB — GLUCOSE, CAPILLARY
Glucose-Capillary: 110 mg/dL — ABNORMAL HIGH (ref 70–99)
Glucose-Capillary: 126 mg/dL — ABNORMAL HIGH (ref 70–99)
Glucose-Capillary: 129 mg/dL — ABNORMAL HIGH (ref 70–99)
Glucose-Capillary: 131 mg/dL — ABNORMAL HIGH (ref 70–99)
Glucose-Capillary: 132 mg/dL — ABNORMAL HIGH (ref 70–99)
Glucose-Capillary: 138 mg/dL — ABNORMAL HIGH (ref 70–99)
Glucose-Capillary: 139 mg/dL — ABNORMAL HIGH (ref 70–99)
Glucose-Capillary: 140 mg/dL — ABNORMAL HIGH (ref 70–99)
Glucose-Capillary: 140 mg/dL — ABNORMAL HIGH (ref 70–99)
Glucose-Capillary: 144 mg/dL — ABNORMAL HIGH (ref 70–99)
Glucose-Capillary: 145 mg/dL — ABNORMAL HIGH (ref 70–99)
Glucose-Capillary: 184 mg/dL — ABNORMAL HIGH (ref 70–99)

## 2020-01-31 LAB — BASIC METABOLIC PANEL
Anion gap: 9 (ref 5–15)
Anion gap: 8 (ref 5–15)
BUN: 18 mg/dL (ref 8–23)
BUN: 22 mg/dL (ref 8–23)
CO2: 24 mmol/L (ref 22–32)
CO2: 24 mmol/L (ref 22–32)
Calcium: 9 mg/dL (ref 8.9–10.3)
Calcium: 9 mg/dL (ref 8.9–10.3)
Chloride: 101 mmol/L (ref 98–111)
Chloride: 99 mmol/L (ref 98–111)
Creatinine, Ser: 0.82 mg/dL (ref 0.61–1.24)
Creatinine, Ser: 0.84 mg/dL (ref 0.61–1.24)
GFR calc Af Amer: >60 mL/min (ref >60)
GFR calc Af Amer: >60 mL/min (ref >60)
GFR calc non Af Amer: >60 mL/min (ref >60)
GFR calc non Af Amer: >60 mL/min (ref >60)
Glucose, Bld: 140 mg/dL — ABNORMAL HIGH (ref 70–99)
Glucose, Bld: 146 mg/dL — ABNORMAL HIGH (ref 70–99)
Potassium: 4.7 mmol/L (ref 3.5–5.1)
Potassium: 4.3 mmol/L (ref 3.5–5.1)
Sodium: 134 mmol/L — ABNORMAL LOW (ref 135–145)
Sodium: 131 mmol/L — ABNORMAL LOW (ref 135–145)

## 2020-01-31 LAB — CBC
HCT: 45.8 % (ref 39.0–52.0)
HCT: 43.3 % (ref 39.0–52.0)
Hemoglobin: 15.3 g/dL (ref 13.0–17.0)
Hemoglobin: 14.5 g/dL (ref 13.0–17.0)
MCH: 29.8 pg (ref 26.0–34.0)
MCH: 29.8 pg (ref 26.0–34.0)
MCHC: 33.4 g/dL (ref 30.0–36.0)
MCHC: 33.5 g/dL (ref 30.0–36.0)
MCV: 89.1 fL (ref 80.0–100.0)
MCV: 89.1 fL (ref 80.0–100.0)
Platelets: 248 10*3/uL (ref 150–400)
Platelets: 235 10*3/uL (ref 150–400)
RBC: 5.14 MIL/uL (ref 4.22–5.81)
RBC: 4.86 MIL/uL (ref 4.22–5.81)
RDW: 14.6 % (ref 11.5–15.5)
RDW: 14.5 % (ref 11.5–15.5)
WBC: 12.6 10*3/uL — ABNORMAL HIGH (ref 4.0–10.5)
WBC: 12.8 10*3/uL — ABNORMAL HIGH (ref 4.0–10.5)
nRBC: 0 % (ref 0.0–0.2)
nRBC: 0 % (ref 0.0–0.2)

## 2020-01-31 LAB — COOXEMETRY PANEL
Carboxyhemoglobin: 1 % (ref 0.5–1.5)
Methemoglobin: 0.5 % (ref 0.0–1.5)
O2 Saturation: 80.9 %
Total hemoglobin: 14.4 g/dL (ref 12.0–16.0)

## 2020-01-31 LAB — MAGNESIUM
Magnesium: 2.2 mg/dL (ref 1.7–2.4)
Magnesium: 2 mg/dL (ref 1.7–2.4)

## 2020-01-31 MED ORDER — ENOXAPARIN SODIUM 40 MG/0.4ML ~~LOC~~ SOLN
40.0000 mg | Freq: Every day | SUBCUTANEOUS | Status: DC
  Administered 2020-01-31 – 2020-02-02 (×3): 40 mg via SUBCUTANEOUS
  Filled 2020-01-31 (×3): qty 0.4

## 2020-01-31 MED ORDER — INSULIN ASPART 100 UNIT/ML ~~LOC~~ SOLN
0.0000 [IU] | SUBCUTANEOUS | Status: DC
  Administered 2020-01-31: 15:00:00 2 [IU] via SUBCUTANEOUS

## 2020-01-31 MED ORDER — SPIRONOLACTONE 12.5 MG HALF TABLET
12.5000 mg | ORAL_TABLET | Freq: Every day | ORAL | Status: DC
  Administered 2020-01-31 – 2020-02-08 (×9): 12.5 mg via ORAL
  Filled 2020-01-31 (×9): qty 1

## 2020-01-31 MED ORDER — FUROSEMIDE 10 MG/ML IJ SOLN
40.0000 mg | Freq: Once | INTRAMUSCULAR | Status: AC
  Administered 2020-01-31: 40 mg via INTRAVENOUS
  Filled 2020-01-31: qty 4

## 2020-01-31 MED ORDER — ATORVASTATIN CALCIUM 40 MG PO TABS
40.0000 mg | ORAL_TABLET | Freq: Every day | ORAL | Status: DC
  Administered 2020-01-31 – 2020-02-08 (×9): 40 mg via ORAL
  Filled 2020-01-31 (×9): qty 1

## 2020-01-31 MED ORDER — INSULIN ASPART 100 UNIT/ML ~~LOC~~ SOLN
0.0000 [IU] | SUBCUTANEOUS | Status: DC
  Administered 2020-01-31 – 2020-02-02 (×5): 2 [IU] via SUBCUTANEOUS

## 2020-01-31 MED ORDER — INSULIN REGULAR(HUMAN) IN NACL 100-0.9 UT/100ML-% IV SOLN
INTRAVENOUS | Status: DC
  Administered 2020-01-31: 2.2 [IU]/h via INTRAVENOUS
  Filled 2020-01-31: qty 100

## 2020-01-31 NOTE — Anesthesia Postprocedure Evaluation (Signed)
Anesthesia Post Note  Patient: Corey Brady  Procedure(s) Performed: OFF PUMP CORONARY ARTERY BYPASS GRAFTING (CABG) TIMES ONE (N/A Chest) TRANSESOPHAGEAL ECHOCARDIOGRAM (TEE) (N/A )     Patient location during evaluation: SICU Anesthesia Type: General Level of consciousness: sedated Pain management: pain level controlled Vital Signs Assessment: post-procedure vital signs reviewed and stable Respiratory status: patient remains intubated per anesthesia plan Cardiovascular status: stable Postop Assessment: no apparent nausea or vomiting Anesthetic complications: no    Last Vitals:  Vitals:   01/31/20 1100 01/31/20 1200  BP:    Pulse: (!) 107 (!) 107  Resp: 16 18  Temp: (!) 36.3 C (!) 36.4 C  SpO2: 98% 98%    Last Pain:  Vitals:   01/31/20 1200  TempSrc: Core  PainSc: 0-No pain                 Sheilyn Boehlke

## 2020-01-31 NOTE — Progress Notes (Signed)
TCTS Evening Rounds  POD #1 s/p CABG x 1 Stable day BP 112/65 (BP Location: Left Arm)   Pulse (!) 102   Temp 98.1 F (36.7 C)   Resp 15   Ht 6' (1.829 m)   Wt 102.8 kg   SpO2 96%   BMI 30.74 kg/m     Intake/Output Summary (Last 24 hours) at 01/31/2020 1939 Last data filed at 01/31/2020 1900 Gross per 24 hour  Intake 4032.99 ml  Output 1929 ml  Net 2103.99 ml   Exam unremarkable  A/p: continue present management Kerryann Allaire Z. Vickey Sages, MD [phone]

## 2020-01-31 NOTE — Plan of Care (Signed)
  Problem: Education: Goal: Knowledge of General Education information will improve Description: Including pain rating scale, medication(s)/side effects and non-pharmacologic comfort measures Outcome: Progressing   Problem: Health Behavior/Discharge Planning: Goal: Ability to manage health-related needs will improve Outcome: Progressing   Problem: Clinical Measurements: Goal: Ability to maintain clinical measurements within normal limits will improve Outcome: Progressing Goal: Will remain free from infection Outcome: Progressing Goal: Diagnostic test results will improve Outcome: Progressing Goal: Respiratory complications will improve Outcome: Progressing Goal: Cardiovascular complication will be avoided Outcome: Progressing   Problem: Activity: Goal: Risk for activity intolerance will decrease Outcome: Progressing   Problem: Skin Integrity: Goal: Risk for impaired skin integrity will decrease Outcome: Progressing   Problem: Education: Goal: Ability to demonstrate management of disease process will improve Outcome: Progressing Goal: Ability to verbalize understanding of medication therapies will improve Outcome: Progressing Goal: Individualized Educational Video(s) Outcome: Progressing   Problem: Activity: Goal: Capacity to carry out activities will improve Outcome: Progressing   Problem: Cardiac: Goal: Ability to achieve and maintain adequate cardiopulmonary perfusion will improve Outcome: Progressing   Problem: Education: Goal: Will demonstrate proper wound care and an understanding of methods to prevent future damage Outcome: Progressing Goal: Knowledge of disease or condition will improve Outcome: Progressing Goal: Knowledge of the prescribed therapeutic regimen will improve Outcome: Progressing Goal: Individualized Educational Video(s) Outcome: Progressing   Problem: Activity: Goal: Risk for activity intolerance will decrease Outcome: Progressing    Problem: Cardiac: Goal: Will achieve and/or maintain hemodynamic stability Outcome: Progressing   Problem: Clinical Measurements: Goal: Postoperative complications will be avoided or minimized Outcome: Progressing   Problem: Respiratory: Goal: Respiratory status will improve Outcome: Progressing   Problem: Skin Integrity: Goal: Wound healing without signs and symptoms of infection Outcome: Progressing Goal: Risk for impaired skin integrity will decrease Outcome: Progressing   Problem: Urinary Elimination: Goal: Ability to achieve and maintain adequate renal perfusion and functioning will improve Outcome: Progressing

## 2020-01-31 NOTE — Progress Notes (Addendum)
Advanced Heart Failure Rounding Note  PCP-Cardiologist: No primary care provider on file.   Subjective:    S/p Off pump CABG x 1 2/ LIMA-LAD 5/4.   POD# 1. Remains on Milrinone 0.375. Off Dobutamine and NE. Co-ox pending. CI 3.42/CO 7.56. MAPs in the 80s.   Extubated. Sitting up in bed. Looks well. Has mild post-operative sternal pain. Appetite is good. Ate most of his breakfast. No dyspnea. 7 lb above preop wt.   Has evidence of hematuria in foley bag. Hgb 15.   PA 37/21 Thermo 7.6/3.4   Cardiac Studies    RHC/LHC 01/26/20 Ao = 101/72 (84) LV = 101/16 RA = 7 RV = 44/10 PA = 44/22 (31) PCW = 13 Fick cardiac output/index = 6.0/2.6 PVR = 3.0 WU FA sat = 99% PA sat = 73%, 74% Assessment: 1. Severe 1v CAD with ostially occlusion of LAD with prominent R->L collaterals and good target 2. Ischemic CM with EF 20%  3. Well preserved hemodynamics  Preoperative 2D Echo 01/23/20 1. Severe global reduction in LV systolic function; restrictive filling; mild LVE; biatrial enlargement; moderate RV dysfunction; mild MR. 2. Left ventricular ejection fraction, by estimation, is <20%. The left ventricle has severely decreased function. The left ventricle demonstrates global hypokinesis. The left ventricular internal cavity size was mildly dilated. Left ventricular diastolic parameters are consistent with Grade III diastolic dysfunction (restrictive). 3. Right ventricular systolic function is moderately reduced. The right ventricular size is normal. There is moderately elevated pulmonary artery systolic pressure. 4. Left atrial size was severely dilated. 5. Right atrial size was mildly dilated. 6. The mitral valve is normal in structure. Mild mitral valve regurgitation. No evidence of mitral stenosis. 7. The aortic valve is tricuspid. Aortic valve regurgitation is not visualized. Mild aortic valve sclerosis is present, with no evidence of aortic valve stenosis. 8. The inferior  vena cava is dilated in size with <50% respiratory variability, suggesting right atrial pressure of 15 mmHg.  Objective:   Weight Range: 102.8 kg Body mass index is 30.74 kg/m.   Vital Signs:   Temp:  [96.3 F (35.7 C)-98.1 F (36.7 C)] 96.8 F (36 C) (05/05 0700) Pulse Rate:  [77-122] 101 (05/05 0700) Resp:  [12-28] 18 (05/05 0700) BP: (86-127)/(60-76) 112/65 (05/05 0600) SpO2:  [95 %-100 %] 99 % (05/05 0700) Arterial Line BP: (70-218)/(39-216) 136/54 (05/05 0700) FiO2 (%):  [40 %-50 %] 40 % (05/04 1332) Weight:  [102.8 kg] 102.8 kg (05/05 0500) Last BM Date: 01/28/20  Weight change: Filed Weights   01/29/20 0430 01/30/20 0352 01/31/20 0500  Weight: 98.9 kg 99.3 kg 102.8 kg    Intake/Output:   Intake/Output Summary (Last 24 hours) at 01/31/2020 0832 Last data filed at 01/31/2020 0700 Gross per 24 hour  Intake 4943.45 ml  Output 1520 ml  Net 3423.45 ml      Physical Exam   PHYSICAL EXAM: General:  Well appearing WM sitting up in bed, No respiratory difficulty HEENT: normal Neck: supple. no JVD. Carotids 2+ bilat; no bruits. No lymphadenopathy or thyromegaly appreciated. Cor: PMI nondisplaced. Regular rhythm, mildly tachy rate. No rubs, gallops or murmurs. Sternotomy site bandaged  Lungs: slightly decreased BS at the bases, no wheezing  Abdomen: soft, nontender, nondistended. No hepatosplenomegaly. No bruits or masses. Good bowel sounds. Extremities: no cyanosis, clubbing, rash, trace bilateral edema, bilateral SCDs Neuro: alert & oriented x 3, cranial nerves grossly intact. moves all 4 extremities w/o difficulty. Affect pleasant. GU: + Foley w/ hematuria  Telemetry   Sinus tach, low 100s-110s    EKG    n/a  Labs    CBC Recent Labs    01/30/20 1750 01/31/20 0312  WBC 12.7* 12.6*  HGB 15.6 15.3  HCT 46.7 45.8  MCV 89.6 89.1  PLT 258 063   Basic Metabolic Panel Recent Labs    01/30/20 1750 01/31/20 0312  NA 136 134*  K 4.6 4.7  CL 104 101    CO2 21* 24  GLUCOSE 157* 140*  BUN 19 18  CREATININE 0.87 0.82  CALCIUM 8.7* 9.0  MG 2.5* 2.2   Liver Function Tests No results for input(s): AST, ALT, ALKPHOS, BILITOT, PROT, ALBUMIN in the last 72 hours. No results for input(s): LIPASE, AMYLASE in the last 72 hours. Cardiac Enzymes No results for input(s): CKTOTAL, CKMB, CKMBINDEX, TROPONINI in the last 72 hours.  BNP: BNP (last 3 results) Recent Labs    01/23/20 0338  BNP 1,370.5*    ProBNP (last 3 results) No results for input(s): PROBNP in the last 8760 hours.   D-Dimer No results for input(s): DDIMER in the last 72 hours. Hemoglobin A1C No results for input(s): HGBA1C in the last 72 hours. Fasting Lipid Panel No results for input(s): CHOL, HDL, LDLCALC, TRIG, CHOLHDL, LDLDIRECT in the last 72 hours. Thyroid Function Tests No results for input(s): TSH, T4TOTAL, T3FREE, THYROIDAB in the last 72 hours.  Invalid input(s): FREET3  Other results:   Imaging    DG Chest Port 1 View  Result Date: 01/30/2020 CLINICAL DATA:  Hypoxia. Status post coronary artery bypass grafting EXAM: PORTABLE CHEST 1 VIEW COMPARISON:  January 23, 2020. FINDINGS: Endotracheal tube tip is 5.4 cm above the carina. Nasogastric tube tip and side port in stomach. Swan-Ganz catheter tip is in the main pulmonary outflow tract. There is a chest tube and mediastinal drain present. No pneumothorax. There is mild left base atelectasis. Lungs elsewhere clear. Heart is borderline enlarged with pulmonary vascularity normal. No adenopathy. No bone lesions. IMPRESSION: Tube and catheter positions as described without pneumothorax. Mild left base atelectasis. No edema or airspace opacity. Heart mildly enlarged. Electronically Signed   By: Lowella Grip III M.D.   On: 01/30/2020 11:19     Medications:     Scheduled Medications: . acetaminophen  1,000 mg Oral Q6H   Or  . acetaminophen (TYLENOL) oral liquid 160 mg/5 mL  1,000 mg Per Tube Q6H  . aspirin  EC  325 mg Oral Daily   Or  . aspirin  324 mg Per Tube Daily  . atorvastatin  20 mg Oral Daily  . bisacodyl  10 mg Oral Daily   Or  . bisacodyl  10 mg Rectal Daily  . Chlorhexidine Gluconate Cloth  6 each Topical Daily  . docusate sodium  200 mg Oral Daily  . insulin aspart  0-24 Units Subcutaneous Q4H  . mouth rinse  15 mL Mouth Rinse BID  . metoprolol tartrate  12.5 mg Oral BID   Or  . metoprolol tartrate  12.5 mg Per Tube BID  . [START ON 02/01/2020] pantoprazole  40 mg Oral Daily  . sodium chloride flush  10-40 mL Intracatheter Q12H  . sodium chloride flush  3 mL Intravenous Q12H    Infusions: . sodium chloride 20 mL/hr at 01/31/20 0700  . sodium chloride    . sodium chloride 20 mL/hr at 01/30/20 2000  . albumin human    . cefUROXime (ZINACEF)  IV Stopped (01/31/20 0154)  . dexmedetomidine (  PRECEDEX) IV infusion Stopped (01/30/20 1234)  . DOBUTamine Stopped (01/31/20 0005)  . famotidine (PEPCID) IV Stopped (01/30/20 1149)  . lactated ringers    . lactated ringers 20 mL/hr at 01/31/20 0700  . milrinone 0.375 mcg/kg/min (01/31/20 0700)  . niCARDipine Stopped (01/31/20 0617)  . nitroGLYCERIN 0 mcg/min (01/30/20 1100)  . norepinephrine (LEVOPHED) Adult infusion Stopped (01/30/20 2255)    PRN Medications: sodium chloride, albumin human, metoprolol tartrate, midazolam, morphine injection, ondansetron (ZOFRAN) IV, oxyCODONE, sodium chloride flush, sodium chloride flush, traMADol     Assessment/Plan   1. Acute Systolic Heart Failure ECHO EF 15% with severe global HK, and moderate RV dysfunction.  Etiology possible ETOH versus HTN (denies severe ETOH use). TSH was ok. HIV NR  -4/28 LHC with severe 1v CAD. RHC with preserved cardiac output.  - S/p CABG x 1 w/ LIMA-LAD 5/4 - remains on milrinone 0.375. Co-ox pending. CI 3.42/CO 7.56. MAPs in the 80s.  - will try to wean milrinone as tolerated and follow co-ox  - will try to gradually add back GDMT when more stable  - he is  up 7 lb above pre-op wt. Will need to diurese w/ IV Lasix, plan 40 mg daily   2. CAD:  - LHC with severe 1VCAD - S/p CABG x 1 w/ LIMA-LAD 5/4 - stable w/o angina - continue post operative management per CT surgery - continue ASA 325 mg - on statin, atorvastatin, increase to 40 mg qhs (LDL 69 mg/dL) - on  blocker, metoprolol 12.5 mg bid   3. Cellulits , R and LLE legs: - Partial Thickness wound with palpable pedal pulses.  - WBC 12. AF.  Started keflex 4/27-->Switched  to vancomycin on 4/28. Now on post-op abx, Zinacef  - WOC appreciated  4. ? H/O ETOH Abuse: - denies heavy use  5. Hematuria - noted in foley bag. May be traumatic from foley placement. Hgb stable at 15 - monitor closely   6. Social:  - He has no insurance so we will need to provide 30 days of HF meds at discharge.  Discussed with HF Pharmacy and HF SW for med assist. Referred to HF Paramedicine.   He will also need to be set up with PCP once discharge.    Length of Stay: [address] [place]  01/31/2020, 8:32 AM  Advanced Heart Failure Team Pager [phone] (M-F; 7a - 4p)  Please contact [facility] for night-coverage after hours (4p -7a ) and weekends on [email or web address]  Agree with above.    POD #1 CABG with off-pump LIMA to LAD.   Remains on milrinone. NE off. Ernestine Conrad number reviewed personally. Output ok. Co-ox 81%  Volume status up. No BM et. Passing a little gas. Creatinine stable. hgb 14.2  General:  Sitting up No resp difficulty HEENT: normal Neck: supple.LIJ swan.  JVP 7-8 Carotids 2+ bilat; no bruits. No lymphadenopathy or thryomegaly appreciated. Cor: Sternal dressing ok PMI nondisplaced. Regular rate & rhythm. No rubs, gallops or murmurs. Lungs: clear decreased in bases  Abdomen: soft, nontender, + distended. No hepatosplenomegaly. No bruits or masses. Hypoactive BS Extremities: no cyanosis, clubbing, rash, 1+ edema  Legs wrapped Neuro: alert & orientedx3, cranial nerves grossly intact.  moves all 4 extremities w/o difficulty. Affect pleasant  He is POD31 cabg. Hemodynamics stable on milrinone. Co-ox high. Volume up. Can drop milrinone back to 0.25. Diurese with lasix 40 iv bid. Can pull swan. D/w Dr. Cliffton Asters.   CRITICAL CARE Performed by: Arvilla Meres  Total critical care time: 35 minutes  Critical care time was exclusive of separately billable procedures and treating other patients.  Critical care was necessary to treat or prevent imminent or life-threatening deterioration.  Critical care was time spent personally by me (independent of midlevel providers or residents) on the following activities: development of treatment plan with patient and/or surrogate as well as nursing, discussions with consultants, evaluation of patient's response to treatment, examination of patient, obtaining history from patient or surrogate, ordering and performing treatments and interventions, ordering and review of laboratory studies, ordering and review of radiographic studies, pulse oximetry and re-evaluation of patient's condition.  Arvilla Meres, MD  4:36 PM

## 2020-01-31 NOTE — Progress Notes (Addendum)
TCTS DAILY ICU PROGRESS NOTE                   [address]            [place] [address]          [phone]   1 Day Post-Op Procedure(s) (LRB): OFF PUMP CORONARY ARTERY BYPASS GRAFTING (CABG) TIMES ONE (N/A) TRANSESOPHAGEAL ECHOCARDIOGRAM (TEE) (N/A)  Total Length of Stay:  LOS: 8 days   Subjective: minor nausea, minor sternal pain, minor SOB- overall feels pretty well + hematuria  Objective: Vital signs in last 24 hours: Temp:  [96.3 F (35.7 C)-98.1 F (36.7 C)] 96.8 F (36 C) (05/05 0700) Pulse Rate:  [77-122] 101 (05/05 0700) Cardiac Rhythm: Normal sinus rhythm;Sinus tachycardia (05/05 0000) Resp:  [12-28] 18 (05/05 0700) BP: (86-127)/(60-76) 112/65 (05/05 0600) SpO2:  [95 %-100 %] 99 % (05/05 0700) Arterial Line BP: (70-218)/(39-216) 136/54 (05/05 0700) FiO2 (%):  [40 %-50 %] 40 % (05/04 1332) Weight:  [102.8 kg] 102.8 kg (05/05 0500)  Filed Weights   01/29/20 0430 01/30/20 0352 01/31/20 0500  Weight: 98.9 kg 99.3 kg 102.8 kg    Weight change: 3.462 kg   Hemodynamic parameters for last 24 hours: PAP: (22-44)/(11-26) 32/17 CO:  [2.5 L/min-8.3 L/min] 7.6 L/min CI:  [1.1 L/min/m2-3.4 L/min/m2] 3.4 L/min/m2  Intake/Output from previous day: 05/04 0701 - 05/05 0700 In: 5343.5 [P.O.:440; I.V.:3787; Blood:170; IV Piggyback:946.4] Out: 1520 [Urine:890; Blood:350; Chest Tube:280]  Intake/Output this shift: No intake/output data recorded.  Current Meds: Scheduled Meds: . acetaminophen  1,000 mg Oral Q6H   Or  . acetaminophen (TYLENOL) oral liquid 160 mg/5 mL  1,000 mg Per Tube Q6H  . aspirin EC  325 mg Oral Daily   Or  . aspirin  324 mg Per Tube Daily  . atorvastatin  20 mg Oral Daily  . bisacodyl  10 mg Oral Daily   Or  . bisacodyl  10 mg Rectal Daily  . Chlorhexidine Gluconate Cloth  6 each Topical Daily  . docusate sodium  200 mg Oral Daily  . mouth rinse  15 mL Mouth Rinse BID  . metoprolol tartrate  12.5 mg Oral BID   Or  .  metoprolol tartrate  12.5 mg Per Tube BID  . [START ON 02/01/2020] pantoprazole  40 mg Oral Daily  . sodium chloride flush  10-40 mL Intracatheter Q12H  . sodium chloride flush  3 mL Intravenous Q12H   Continuous Infusions: . sodium chloride 20 mL/hr at 01/31/20 0700  . sodium chloride    . sodium chloride 20 mL/hr at 01/30/20 2000  . albumin human    . cefUROXime (ZINACEF)  IV Stopped (01/31/20 0154)  . dexmedetomidine (PRECEDEX) IV infusion Stopped (01/30/20 1234)  . DOBUTamine Stopped (01/31/20 0005)  . famotidine (PEPCID) IV Stopped (01/30/20 1149)  . insulin 2.8 mL/hr at 01/31/20 0700  . lactated ringers    . lactated ringers 20 mL/hr at 01/31/20 0700  . milrinone 0.375 mcg/kg/min (01/31/20 0700)  . niCARDipine Stopped (01/31/20 0617)  . nitroGLYCERIN 0 mcg/min (01/30/20 1100)  . norepinephrine (LEVOPHED) Adult infusion Stopped (01/30/20 2255)   PRN Meds:.sodium chloride, albumin human, dextrose, metoprolol tartrate, midazolam, morphine injection, ondansetron (ZOFRAN) IV, oxyCODONE, sodium chloride flush, sodium chloride flush, traMADol  General appearance: alert, cooperative and no distress Heart: regular rate and rhythm and tacht, no rub Lungs: clear to auscultation bilaterally Abdomen: benign Extremities: wrapped in ACE Wound: dressings CDI  Lab Results: CBC: Recent  Labs    01/30/20 1750 01/31/20 0312  WBC 12.7* 12.6*  HGB 15.6 15.3  HCT 46.7 45.8  PLT 258 248   BMET:  Recent Labs    01/30/20 1750 01/31/20 0312  NA 136 134*  K 4.6 4.7  CL 104 101  CO2 21* 24  GLUCOSE 157* 140*  BUN 19 18  CREATININE 0.87 0.82  CALCIUM 8.7* 9.0    CMET: Lab Results  Component Value Date   WBC 12.6 (H) 01/31/2020   HGB 15.3 01/31/2020   HCT 45.8 01/31/2020   PLT 248 01/31/2020   GLUCOSE 140 (H) 01/31/2020   CHOL 116 01/23/2020   TRIG 56 01/23/2020   HDL 36 (L) 01/23/2020   LDLCALC 69 01/23/2020   ALT 15 01/23/2020   AST 31 01/23/2020   NA 134 (L) 01/31/2020    K 4.7 01/31/2020   CL 101 01/31/2020   CREATININE 0.82 01/31/2020   BUN 18 01/31/2020   CO2 24 01/31/2020   TSH 2.785 01/23/2020   INR 1.3 (H) 01/30/2020   HGBA1C 6.3 (H) 01/23/2020      PT/INR:  Recent Labs    01/30/20 1142  LABPROT 15.3*  INR 1.3*   Radiology: [facility] Chest Port 1 View  Result Date: 01/30/2020 CLINICAL DATA:  Hypoxia. Status post coronary artery bypass grafting EXAM: PORTABLE CHEST 1 VIEW COMPARISON:  January 23, 2020. FINDINGS: Endotracheal tube tip is 5.4 cm above the carina. Nasogastric tube tip and side port in stomach. Swan-Ganz catheter tip is in the main pulmonary outflow tract. There is a chest tube and mediastinal drain present. No pneumothorax. There is mild left base atelectasis. Lungs elsewhere clear. Heart is borderline enlarged with pulmonary vascularity normal. No adenopathy. No bone lesions. IMPRESSION: Tube and catheter positions as described without pneumothorax. Mild left base atelectasis. No edema or airspace opacity. Heart mildly enlarged. Electronically Signed   By: Lowella Grip III M.D.   On: 01/30/2020 11:19     Assessment/Plan: S/P Procedure(s) (LRB): OFF PUMP CORONARY ARTERY BYPASS GRAFTING (CABG) TIMES ONE (N/A) TRANSESOPHAGEAL ECHOCARDIOGRAM (TEE) (N/A)  1 doing well POD#1 OP CABx1, LIMA-LAD 2 excellent CO/CI, on milrinone- will check Co-Ox and wean as able 3 sinus tachy to 120's , cont beta blocker, may need to increase dose 4 fair  UOP, + hematuria, will keep foley for now, no known prostate issues per patient. Will prob need som diuresis 5 not anemic 6 minor leukocytosis, systemic inflamm rxn 7 platelets - normal 8 renal fxn is normal 9 BS well controlled - will transition to SSI, Hg A1C6.3- no meds preop 10 routine pulm toilet/rehab 11 chest tubes 280 cc post op- poss remove later today 12 progression orders  John Giovanni PA-C Pager [phone] 01/31/2020 7:46 AM   Doing well On milr 0.375 with good hemodynamics Will wean  milr to 0.25 Will remove CT Will check with HF if they want to keep a-line and swan  Winford Hehn O Jina Olenick

## 2020-02-01 ENCOUNTER — Inpatient Hospital Stay (HOSPITAL_COMMUNITY): Payer: Self-pay

## 2020-02-01 LAB — GLUCOSE, CAPILLARY
Glucose-Capillary: 104 mg/dL — ABNORMAL HIGH (ref 70–99)
Glucose-Capillary: 109 mg/dL — ABNORMAL HIGH (ref 70–99)
Glucose-Capillary: 112 mg/dL — ABNORMAL HIGH (ref 70–99)
Glucose-Capillary: 116 mg/dL — ABNORMAL HIGH (ref 70–99)
Glucose-Capillary: 158 mg/dL — ABNORMAL HIGH (ref 70–99)
Glucose-Capillary: 96 mg/dL (ref 70–99)

## 2020-02-01 LAB — BASIC METABOLIC PANEL
Anion gap: 3 — ABNORMAL LOW (ref 5–15)
BUN: 18 mg/dL (ref 8–23)
CO2: 25 mmol/L (ref 22–32)
Calcium: 8.1 mg/dL — ABNORMAL LOW (ref 8.9–10.3)
Chloride: 101 mmol/L (ref 98–111)
Creatinine, Ser: 0.64 mg/dL (ref 0.61–1.24)
GFR calc Af Amer: >60 mL/min (ref >60)
GFR calc non Af Amer: >60 mL/min (ref >60)
Glucose, Bld: 111 mg/dL — ABNORMAL HIGH (ref 70–99)
Potassium: 4.2 mmol/L (ref 3.5–5.1)
Sodium: 129 mmol/L — ABNORMAL LOW (ref 135–145)

## 2020-02-01 LAB — COOXEMETRY PANEL
Carboxyhemoglobin: 1.8 % — ABNORMAL HIGH (ref 0.5–1.5)
Methemoglobin: 0.9 % (ref 0.0–1.5)
O2 Saturation: 64 %
Total hemoglobin: 13.4 g/dL (ref 12.0–16.0)

## 2020-02-01 LAB — CBC
HCT: 37.8 % — ABNORMAL LOW (ref 39.0–52.0)
Hemoglobin: 12.6 g/dL — ABNORMAL LOW (ref 13.0–17.0)
MCH: 29.8 pg (ref 26.0–34.0)
MCHC: 33.3 g/dL (ref 30.0–36.0)
MCV: 89.4 fL (ref 80.0–100.0)
Platelets: 193 10*3/uL (ref 150–400)
RBC: 4.23 MIL/uL (ref 4.22–5.81)
RDW: 14.6 % (ref 11.5–15.5)
WBC: 11.2 10*3/uL — ABNORMAL HIGH (ref 4.0–10.5)
nRBC: 0 % (ref 0.0–0.2)

## 2020-02-01 MED ORDER — METOCLOPRAMIDE HCL 5 MG/ML IJ SOLN
10.0000 mg | Freq: Three times a day (TID) | INTRAMUSCULAR | Status: AC
  Administered 2020-02-01 – 2020-02-02 (×3): 10 mg via INTRAVENOUS
  Filled 2020-02-01 (×3): qty 2

## 2020-02-01 NOTE — Progress Notes (Addendum)
Advanced Heart Failure Rounding Note  PCP-Cardiologist: No primary care provider on file.   Subjective:    S/p Off pump CABG x 1 w/  LIMA-LAD 5/4.   POD# 2. Remains on Milrinone, now on 0.25. Swan pulled yesterday. Still has Lt IJ CVC. CVP 6 (sitting up in chair), but wt 8 lb above preop wt. -2.2L in UOP yesterday. Co-ox pending.    He feels ok, other than post-operative sternal pain. Has been ambulating w/ CR.   Continues to have hematuria, Hgb down 14.5>>12.6.   Na also down to 129 today. No symptoms.    Cardiac Studies    RHC/LHC 01/26/20 Ao = 101/72 (84) LV = 101/16 RA = 7 RV = 44/10 PA = 44/22 (31) PCW = 13 Fick cardiac output/index = 6.0/2.6 PVR = 3.0 WU FA sat = 99% PA sat = 73%, 74% Assessment: 1. Severe 1v CAD with ostially occlusion of LAD with prominent R->L collaterals and good target 2. Ischemic CM with EF 20%  3. Well preserved hemodynamics  Preoperative 2D Echo 01/23/20 1. Severe global reduction in LV systolic function; restrictive filling; mild LVE; biatrial enlargement; moderate RV dysfunction; mild MR. 2. Left ventricular ejection fraction, by estimation, is <20%. The left ventricle has severely decreased function. The left ventricle demonstrates global hypokinesis. The left ventricular internal cavity size was mildly dilated. Left ventricular diastolic parameters are consistent with Grade III diastolic dysfunction (restrictive). 3. Right ventricular systolic function is moderately reduced. The right ventricular size is normal. There is moderately elevated pulmonary artery systolic pressure. 4. Left atrial size was severely dilated. 5. Right atrial size was mildly dilated. 6. The mitral valve is normal in structure. Mild mitral valve regurgitation. No evidence of mitral stenosis. 7. The aortic valve is tricuspid. Aortic valve regurgitation is not visualized. Mild aortic valve sclerosis is present, with no evidence of aortic valve stenosis. 8.  The inferior vena cava is dilated in size with <50% respiratory variability, suggesting right atrial pressure of 15 mmHg.  Objective:   Weight Range: 103.2 kg Body mass index is 30.86 kg/m.   Vital Signs:   Temp:  [97 F (36.1 C)-98.1 F (36.7 C)] 98 F (36.7 C) (05/06 0400) Pulse Rate:  [96-112] 112 (05/06 0700) Resp:  [13-20] 19 (05/06 0700) SpO2:  [95 %-100 %] 96 % (05/06 0700) Arterial Line BP: (121-149)/(41-68) 138/62 (05/06 0700) Weight:  [103.2 kg] 103.2 kg (05/06 0600) Last BM Date: 01/28/20  Weight change: Filed Weights   01/30/20 0352 01/31/20 0500 02/01/20 0600  Weight: 99.3 kg 102.8 kg 103.2 kg    Intake/Output:   Intake/Output Summary (Last 24 hours) at 02/01/2020 0755 Last data filed at 02/01/2020 0600 Gross per 24 hour  Intake 1303.18 ml  Output 2209 ml  Net -905.82 ml      Physical Exam   PHYSICAL EXAM: CVP 6 (sitting up in chair) General:  Well appearing WM sitting up in chair, No respiratory difficulty HEENT: normal Neck: supple. no JVD. + Lt IJ CVC Carotids 2+ bilat; no bruits. No lymphadenopathy or thyromegaly appreciated. Cor: PMI nondisplaced. Regular rhythm, mildly tachy rate. No rubs, gallops or murmurs. Sternotomy site ok  Lungs: clear  Abdomen: soft, nontender, nondistended. No hepatosplenomegaly. No bruits or masses. Good bowel sounds. Extremities: no cyanosis, clubbing, rash, trace bilateral edema, bilateral TED hoses  Neuro: alert & oriented x 3, cranial nerves grossly intact. moves all 4 extremities w/o difficulty. Affect pleasant.   Telemetry   Sinus tach, 110s Personally reviewed  EKG    n/a  Labs    CBC Recent Labs    01/31/20 1738 02/01/20 0421  WBC 12.8* 11.2*  HGB 14.5 12.6*  HCT 43.3 37.8*  MCV 89.1 89.4  PLT 235 193   Basic Metabolic Panel Recent Labs    48/25/00 0312 01/31/20 1120 01/31/20 1738 02/01/20 0421  NA 134*  --  131* 129*  K 4.7  --  4.3 4.2  CL 101  --  99 101  CO2 24  --  24 25    GLUCOSE 140*  --  146* 111*  BUN 18  --  22 18  CREATININE 0.82   < > 0.84 0.64  CALCIUM 9.0  --  9.0 8.1*  MG 2.2  --  2.0  --    < > = values in this interval not displayed.   Liver Function Tests No results for input(s): AST, ALT, ALKPHOS, BILITOT, PROT, ALBUMIN in the last 72 hours. No results for input(s): LIPASE, AMYLASE in the last 72 hours. Cardiac Enzymes No results for input(s): CKTOTAL, CKMB, CKMBINDEX, TROPONINI in the last 72 hours.  BNP: BNP (last 3 results) Recent Labs    01/23/20 0338  BNP 1,370.5*    ProBNP (last 3 results) No results for input(s): PROBNP in the last 8760 hours.   D-Dimer No results for input(s): DDIMER in the last 72 hours. Hemoglobin A1C No results for input(s): HGBA1C in the last 72 hours. Fasting Lipid Panel No results for input(s): CHOL, HDL, LDLCALC, TRIG, CHOLHDL, LDLDIRECT in the last 72 hours. Thyroid Function Tests No results for input(s): TSH, T4TOTAL, T3FREE, THYROIDAB in the last 72 hours.  Invalid input(s): FREET3  Other results:   Imaging    No results found.   Medications:     Scheduled Medications: . acetaminophen  1,000 mg Oral Q6H   Or  . acetaminophen (TYLENOL) oral liquid 160 mg/5 mL  1,000 mg Per Tube Q6H  . aspirin EC  325 mg Oral Daily   Or  . aspirin  324 mg Per Tube Daily  . atorvastatin  40 mg Oral Daily  . bisacodyl  10 mg Oral Daily   Or  . bisacodyl  10 mg Rectal Daily  . Chlorhexidine Gluconate Cloth  6 each Topical Daily  . docusate sodium  200 mg Oral Daily  . enoxaparin (LOVENOX) injection  40 mg Subcutaneous QHS  . insulin aspart  0-24 Units Subcutaneous Q4H  . mouth rinse  15 mL Mouth Rinse BID  . metoprolol tartrate  12.5 mg Oral BID   Or  . metoprolol tartrate  12.5 mg Per Tube BID  . pantoprazole  40 mg Oral Daily  . sodium chloride flush  10-40 mL Intracatheter Q12H  . sodium chloride flush  3 mL Intravenous Q12H  . spironolactone  12.5 mg Oral Daily    Infusions: .  sodium chloride Stopped (01/31/20 1744)  . sodium chloride    . sodium chloride 20 mL/hr at 01/30/20 2000  . dexmedetomidine (PRECEDEX) IV infusion Stopped (01/30/20 1234)  . lactated ringers    . lactated ringers 20 mL/hr at 02/01/20 0600  . milrinone 0.25 mcg/kg/min (02/01/20 0600)  . niCARDipine Stopped (01/31/20 0617)    PRN Medications: sodium chloride, metoprolol tartrate, midazolam, morphine injection, ondansetron (ZOFRAN) IV, oxyCODONE, sodium chloride flush, sodium chloride flush, traMADol     Assessment/Plan   1. Acute Systolic Heart Failure ECHO EF 15% with severe global HK, and moderate RV dysfunction.  Etiology possible  ETOH versus HTN (denies severe ETOH use). TSH was ok. HIV NR  -4/28 LHC with severe 1v CAD. RHC with preserved cardiac output.  - S/p CABG x 1 w/ LIMA-LAD 5/4 - remains on milrinone 0.25. Co-ox pending.  - will try to continue to wean milrinone as tolerated and follow co-ox  - continue  IV Lasix 40 mg bid  - Increase spironolactone to 25  - restart losartan 12.5 mg  - consider future addition of a SGLT2i    2. CAD:  - LHC with severe 1VCAD - S/p CABG x 1 w/ LIMA-LAD 5/4 - stable w/o angina - continue post operative management per CT surgery - continue ASA 325 mg - on statin, atorvastatin 40 mg - on  blocker, metoprolol 12.5 mg bid   3. Cellulits , R and LLE legs: - Partial Thickness wound with palpable pedal pulses.  - WBC down-trending, 11 today. AF.  Started keflex 4/27-->Switched  to vancomycin on 4/28. Now on post-op abx, Zinacef  - WOC appreciated  4. ? H/O ETOH Abuse: - denies heavy use  5. Hematuria - continues to have hematuria. May be traumatic from recent foley placement. Hgb down from 14.5>>12.6 - will need to monitor closely   6. Hyponatremia - Na 129  - monitor closely, if further drop, may need a dose of tolvaptan   7. Social:  - He has no insurance so we will need to provide 30 days of HF meds at discharge.   Discussed with HF Pharmacy and HF SW for med assist. Referred to HF Paramedicine.   He will also need to be set up with PCP once discharge.    Length of Stay: [address] PA-C  02/01/2020, 7:55 AM  Advanced Heart Failure Team Pager [phone] (M-F; 7a - 4p)  Please contact [facility] for night-coverage after hours (4p -7a ) and weekends on [email or web address]  Patient seen and examined with the above-signed Advanced Practice Provider and/or Housestaff. I personally reviewed laboratory data, imaging studies and relevant notes. I independently examined the patient and formulated the important aspects of the plan. I have edited the note to reflect any of my changes or salient points. I have personally discussed the plan with the patient and/or family.  POD#2 CABG. Co-ox 64% on milrinone 0.25. Diuresing well. Creatinine stable. Weight still up about 8 pounds. No BM yet. Remains in NSR  General:  Sitting up  No resp difficulty HEENT: normal Neck: supple.+LIJ introducer. Carotids 2+ bilat; no bruits. No lymphadenopathy or thryomegaly appreciated. Cor: Wound ok. PMI nondisplaced. Regular tachy No rubs, gallops or murmurs. Lungs: clear Abdomen: soft, nontender,  + distended. No hepatosplenomegaly. No bruits or masses. Hypoactive bowel sounds. Extremities: no cyanosis, clubbing, rash, 1+ edema + UNNA Neuro: alert & orientedx3, cranial nerves grossly intact. moves all 4 extremities w/o difficulty. Affect pleasant  Stable POD#2. Wean milrinone to 0.125. Continue IV diuresis. Titrate HF meds as above. Continue to mobilize.   Arvilla Meres, MD  9:00 AM

## 2020-02-01 NOTE — Progress Notes (Signed)
      [address]       [place] [address]             [phone]                 2 Days Post-Op Procedure(s) (LRB): OFF PUMP CORONARY ARTERY BYPASS GRAFTING (CABG) TIMES ONE (N/A) TRANSESOPHAGEAL ECHOCARDIOGRAM (TEE) (N/A)   Events: Doing well.   Ambulated without issue _______________________________________________________________ Vitals: BP 112/65 (BP Location: Left Arm)   Pulse (!) 112   Temp 98 F (36.7 C) (Oral)   Resp 19   Ht 6' (1.829 m)   Wt 103.2 kg   SpO2 96%   BMI 30.86 kg/m   - Neuro: alert NAD  - Cardiovascular: sinus  Drips: milr 0.25.   PAP: (32-41)/(17-25) 41/24  - Pulm: EWOB off [place]    ABG    Component Value Date/Time   PHART 7.328 (L) 01/30/2020 1513   PCO2ART 47.4 01/30/2020 1513   PO2ART 141 (H) 01/30/2020 1513   HCO3 25.1 01/30/2020 1513   TCO2 27 01/30/2020 1513   ACIDBASEDEF 2.0 01/30/2020 1513   O2SAT 80.9 01/31/2020 0954    - Abd: soft - Extremity: warm  .Intake/Output      05/05 0701 - 05/06 0700 05/06 0701 - 05/07 0700   P.O. 240    I.V. (mL/kg) 863.2 (8.4)    Blood     IV Piggyback 200    Total Intake(mL/kg) 1303.2 (12.6)    Urine (mL/kg/hr) 2199 (0.9)    Blood     Chest Tube 10    Total Output 2209    Net -905.8            _______________________________________________________________ Labs: CBC Latest Ref Rng & Units 02/01/2020 01/31/2020 01/31/2020  WBC 4.0 - 10.5 K/uL 11.2(H) 12.8(H) 12.6(H)  Hemoglobin 13.0 - 17.0 g/dL 12.6(L) 14.5 14.2  Hematocrit 39.0 - 52.0 % 37.8(L) 43.3 42.2  Platelets 150 - 400 K/uL 193 235 225   CMP Latest Ref Rng & Units 02/01/2020 01/31/2020 01/31/2020  Glucose 70 - 99 mg/dL 063(K) 160(F) -  BUN 8 - 23 mg/dL 18 22 -  Creatinine 0.93 - 1.24 mg/dL 2.35 5.73 2.20  Sodium 135 - 145 mmol/L 129(L) 131(L) -  Potassium 3.5 - 5.1 mmol/L 4.2 4.3 -  Chloride 98 - 111 mmol/L 101 99 -  CO2 22 - 32 mmol/L 25 24 -  Calcium 8.9 - 10.3 mg/dL 8.1(L) 9.0 -  Total Protein 6.5 - 8.1  g/dL - - -  Total Bilirubin 0.3 - 1.2 mg/dL - - -  Alkaline Phos 38 - 126 U/L - - -  AST 15 - 41 U/L - - -  ALT 0 - 44 U/L - - -    CXR: stable  _______________________________________________________________  Assessment and Plan: POD 2 s/p offCabg 1  Neuro: MSK pain with movement.  Will ask for pain meds CV: swan out.  On milr.  Good hemodynamics.  HF managing gtts Pulm: continue pulm toilet Renal: stable. goog uop GI: on diet Heme: stable ID: [email or web address], no WBC Endo: SSI Dispo: clear for step down if can manage milr.    Brynda Greathouse, MD 02/01/2020 8:04 AM

## 2020-02-01 NOTE — Discharge Summary (Signed)
Physician Discharge Summary  Patient ID: Corey Brady MRN: [id number] DOB/AGE: 64-Jul-1957 64 y.o.  Admit date: 01/23/2020 Discharge date: 02/08/2020  Admission Diagnoses:  Discharge Diagnoses:  Active Problems:   Acute heart failure (HCC)   S/P CABG (coronary artery bypass graft)      HPI: at time of admission  Corey Brady is a 64 y.o male who has not seen a doctor since childhood presentingto the ED with progressive BLE of 2-3 months duration. The swelling initially started in his ankles and improved with elevation; however, over the last 2-3 months has progressed up his legs to his lower abdomen. In addition to the swelling he has noticed progressive fatigue and DOE. He is unable to walk more than a block because of pain in his feet (related to the swelling) and shortness of breath. He endorses abdominal distention, early satiety, and orthopnea. As mentioned he has not seen a doctor his he was a child. He has been told his BP was high at the dentist. He does not have a family history of any medical conditions. He does not smoke. He previously drank 2-3 12oz beers per day but has not done so in several months. He does not use recreational drugs. He previously worked at a [employer] but has not worked in several years. He now collects social security. He did have some exertional chest pain ayear or two ago but has not had any recently.  He was admitted for acute heart failure by cardiology for further management.  He is also found to have cellulitis in his lower extremities and was begun on Keflex.    Hospital Course: The patient was admitted for further evaluation and treatment by cardiology.  He was seen by [name] and started on a course of diuretics.  Echocardiogram was also obtained. The wound care nurse also assisted with management of his lower extremity wounds.  Ultimately was determined that he should undergo cardiac catheterization and this was performed.  He was found to have  severe single-vessel coronary artery disease and Dr. Cliffton Asters was consulted from cardiothoracic surgery.  He is felt to be a candidate for off-pump CABG and on 01/30/2020 he was taken the operating room where he underwent the below described procedure.  He tolerated well was taken to the surgical intensive care unit in stable condition.  Postoperative hospital course:  The patient has done well.  He has maintained stable hemodynamics initially on milrinone which has been weaned over time. Preop echo showed EFx less than 20 percent.  The advanced heart failure team assisted with postoperative management.  All routine lines, monitors and drainage devices have discontinued in the standard fashion.  He does have a very mild expected acute blood loss anemia.  He did develop postoperative atrial fibrillation and a flutter and was started on amiodarone.  He was observed for a few days on intravenous amiodarone but continued to have elevated heart rate.  It was subsequently determined by cardiology that he should undergo DC cardioversion which was done on 02/06/2020 by [name].  He is being converted to p.o. amiodarone and is maintaining sinus rhythm with occasional PACs.  He is felt to be stabilized in regard to his congestive heart failure the AHF team is assisting with obtaining medications at discharge.  Renal function is noted to be within normal limits with most recent BUN/creatinine dated 02/07/2020 17/0.94.  He does have a mild expected acute blood loss anemia and values are stabilized.  Most recent hemoglobin hematocrit  are 11.3 and 34.9 respectively.  The patient is a TE[employer] teacher [employer] and we are hoping to stop Eliquis in the 3 to 4-week range if he maintains sinus rhythm.  At the time of discharge patient is felt to be quite stable.  Consults: cardiology  Significant Diagnostic Studies: angiography: cardiac cath  ECHOCARDIOGRAM REPORT       Patient Name:  Corey Brady Date of Exam: 01/23/2020    Medical Rec #: [id number] Height:    72.0 in  Accession #:  [id number] Weight:    268.0 lb  Date of Birth: 1955-09-30 BSA:     2.413 m  Patient Age:  64 years  BP:      133/91 mmHg  Patient Gender: M     HR:      99 bpm.  Exam Location: Inpatient   Procedure: 2D Echo, Cardiac Doppler and Color Doppler   Indications:  I50.23 Acute on chronic systolic (congestive) heart  failure    History:    Patient has no prior history of Echocardiogram  examinations.    Sonographer:  Tiffany Dance  Referring Phys: [id number] Elmer Sow BERO   IMPRESSIONS    1. Severe global reduction in LV systolic function; restrictive filling;  mild LVE; biatrial enlargement; moderate RV dysfunction; mild MR.  2. Left ventricular ejection fraction, by estimation, is <20%. The left  ventricle has severely decreased function. The left ventricle demonstrates  global hypokinesis. The left ventricular internal cavity size was mildly  dilated. Left ventricular  diastolic parameters are consistent with Grade III diastolic dysfunction  (restrictive).  3. Right ventricular systolic function is moderately reduced. The right  ventricular size is normal. There is moderately elevated pulmonary artery  systolic pressure.  4. Left atrial size was severely dilated.  5. Right atrial size was mildly dilated.  6. The mitral valve is normal in structure. Mild mitral valve  regurgitation. No evidence of mitral stenosis.  7. The aortic valve is tricuspid. Aortic valve regurgitation is not  visualized. Mild aortic valve sclerosis is present, with no evidence of  aortic valve stenosis.  8. The inferior vena cava is dilated in size with <50% respiratory  variability, suggesting right atrial pressure of 15 mmHg.   FINDINGS  Left Ventricle: Left ventricular ejection fraction, by estimation, is  <20%. The left ventricle has severely decreased function. The left  ventricle  demonstrates global hypokinesis. The left ventricular internal  cavity size was mildly dilated. There is no  left ventricular hypertrophy. Left ventricular diastolic parameters are  consistent with Grade III diastolic dysfunction (restrictive).   Right Ventricle: The right ventricular size is normal.Right ventricular  systolic function is moderately reduced. There is moderately elevated  pulmonary artery systolic pressure. The tricuspid regurgitant velocity is  3.03 m/s, and with an assumed right  atrial pressure of 15 mmHg, the estimated right ventricular systolic  pressure is 51.7 mmHg.   Left Atrium: Left atrial size was severely dilated.   Right Atrium: Right atrial size was mildly dilated.   Pericardium: Trivial pericardial effusion is present.   Mitral Valve: The mitral valve is normal in structure. Normal mobility of  the mitral valve leaflets. Mild mitral valve regurgitation. No evidence of  mitral valve stenosis.   Tricuspid Valve: The tricuspid valve is normal in structure. Tricuspid  valve regurgitation is mild . No evidence of tricuspid stenosis.   Aortic Valve: The aortic valve is tricuspid. Aortic valve regurgitation is  not visualized. Mild aortic valve sclerosis is present, with no  evidence  of aortic valve stenosis.   Pulmonic Valve: The pulmonic valve was normal in structure. Pulmonic valve  regurgitation is trivial. No evidence of pulmonic stenosis.   Aorta: The aortic root is normal in size and structure.   Venous: The inferior vena cava is dilated in size with less than 50%  respiratory variability, suggesting right atrial pressure of 15 mmHg.   IAS/Shunts: There is left bowing of the interatrial septum, suggestive of  elevated right atrial pressure. No atrial level shunt detected by color  flow Doppler.   Additional Comments: Severe global reduction in LV systolic function;  restrictive filling; mild LVE; biatrial enlargement; moderate RV    dysfunction; mild MR.     LEFT VENTRICLE  PLAX 2D  LVIDd:     5.47 cm Diastology  LVIDs:     4.76 cm LV e' lateral:  3.65 cm/s  LV PW:     1.34 cm LV E/e' lateral: 24.2  LV IVS:    1.07 cm LV e' medial:  2.68 cm/s  LVOT diam:   2.40 cm LV E/e' medial: 32.9  LV SV:     52  LV SV Index:  22  LVOT Area:   4.52 cm     RIGHT VENTRICLE      IVC  RV Basal diam: 3.73 cm  IVC diam: 2.74 cm  RV Mid diam:  2.88 cm  RV S prime:   6.41 cm/s  TAPSE (M-mode): 1.3 cm   LEFT ATRIUM       Index    RIGHT ATRIUM      Index  LA diam:    5.20 cm 2.16 cm/m RA Area:   24.00 cm  LA Vol (A2C):  150.0 ml 62.17 ml/m RA Volume:  81.10 ml 33.62 ml/m  LA Vol (A4C):  105.0 ml 43.52 ml/m  LA Biplane Vol: 127.0 ml 52.64 ml/m  AORTIC VALVE  LVOT Vmax:  70.20 cm/s  LVOT Vmean: 43.600 cm/s  LVOT VTI:  0.116 m    AORTA  Ao Root diam: 3.80 cm  Ao Asc diam: 3.60 cm   MITRAL VALVE        TRICUSPID VALVE  MV Area (PHT): 5.25 cm  TR Peak grad:  36.7 mmHg  MV Decel Time: 145 msec  TR Vmax:    303.00 cm/s  MV E velocity: 88.25 cm/s               SHUNTS               Systemic VTI: 0.12 m               Systemic Diam: 2.40 cm   Kirk Ruths MD  Electronically signed by Kirk Ruths MD  Signature Date/Time: 01/23/2020/4:54:36 PM      Final   RIGHT/LEFT HEART CATH AND CORONARY ANGIOGRAPHY  Conclusion    Ost LAD to Prox LAD lesion is 100% stenosed.  RPDA lesion is 20% stenosed.   Findings:  Ao = 101/72 (84) LV = 101/16 RA = 7 RV = 44/10 PA = 44/22 (31) PCW = 13 Fick cardiac output/index = 6.0/2.6 PVR = 3.0 WU FA sat = 99% PA sat = 73%, 74%  Assessment: 1. Severe 1v CAD with ostially occlusion of LAD with prominent R->L collaterals and good target 2. Ischemic CM with EF 20%  3. Well preserved  hemodynamics  Plan/Discussion:  cMRI for viability. TCTS consult for LIMA to LAD.   Glori Bickers, MD  8:49 AM  Surgeon Notes    01/30/2020 11:16 AM Op Note signed by Corliss Skains, MD    01/30/2020 10:54 AM Operative Note - Scan signed by Default, Provider, MD  Indications  Acute systolic heart failure (HCC) [I50.21 (ICD-10-CM)]  Procedural Details  Technical Details The risks and indication of the procedure were explained. Consent was signed and placed on the chart. An appropriate timeout was taken prior to the procedure.   The right AC fossa was prepped and draped in the routine sterile fashion and anesthetized with 1% local lidocaine. The pre-existing PIV in the right [facility] was exchanged over a wire for a 5 FR venous sheath using a modified Seldinger technique. A standard Swan-Ganz catheter was used for the procedure.   After a normal Allen's test was confirmed, the right wrist was prepped and draped in the routine sterile fashion and anesthetized with 1% local lidocaine. A 5 FR arterial sheath was then placed in the right radial artery using a modified Seldinger technique. 3mg  IV verapamil was given through the sheath. Systemic heparin was administered.  Standard catheters including a JL 3.5 and a JR 4 were used. All catheter exchanges were made over a wire. Estimated blood loss <50 mL.   During this procedure medications were administered to achieve and maintain moderate conscious sedation while the patient's heart rate, blood pressure, and oxygen saturation were continuously monitored and I was present face-to-face 100% of this time.  Medications (Filter: Administrations occurring from 01/26/20 0722 to 01/26/20 0845) (important)  Continuous medications are totaled by the amount administered until 01/26/20 0845.  Heparin (Porcine) in NaCl 1000-0.9 UT/500ML-% SOLN (mL) Total volume:  1,000 mL  Date/Time  Rate/Dose/Volume Action  01/26/20 0736  500 mL Given   0736  500 mL Given    midazolam (VERSED) injection (mg) Total dose:  1 mg  Date/Time  Rate/Dose/Volume Action  01/26/20 0810  1 mg Given    fentaNYL (SUBLIMAZE) injection (mcg) Total dose:  25 mcg  Date/Time  Rate/Dose/Volume Action  01/26/20 0811  25 mcg Given    lidocaine (PF) (XYLOCAINE) 1 % injection (mL) Total volume:  4 mL  Date/Time  Rate/Dose/Volume Action  01/26/20 0811  2 mL Given  0815  2 mL Given    Radial Cocktail/Verapamil only (mL) Total volume:  10 mL  Date/Time  Rate/Dose/Volume Action  01/26/20 0815  10 mL Given    heparin sodium (porcine) injection (Units) Total dose:  5,000 Units  Date/Time  Rate/Dose/Volume Action  01/26/20 0822  5,000 Units Given    iohexol (OMNIPAQUE) 350 MG/ML injection (mL) Total volume:  30 mL  Date/Time  Rate/Dose/Volume Action  01/26/20 0831  30 mL Given    0.9 % sodium chloride infusion (mL/hr) Total dose:  Cannot be calculated* Dosing weight:  112.5  *Administration dose not documented Date/Time  Rate/Dose/Volume Action  01/26/20 0722  *Not included in total MAR Hold    digoxin (LANOXIN) tablet 0.125 mg (mg) Total dose:  Cannot be calculated*  *Administration dose not documented Date/Time  Rate/Dose/Volume Action  01/26/20 0722  *Not included in total MAR Hold    enoxaparin (LOVENOX) injection 40 mg (mg) Total dose:  Cannot be calculated*  *Administration dose not documented Date/Time  Rate/Dose/Volume Action  01/26/20 0722  *Not included in total MAR Hold    furosemide (LASIX) injection 40 mg (mg) Total dose:  Cannot be calculated*  *Administration dose not documented Date/Time  Rate/Dose/Volume Action  01/26/20 0722  *Not included in total MAR Hold  0800  *Not included in total Automatically Held  [phone]  *Not included in total MAR Unhold    losartan (COZAAR) tablet 25 mg (mg) Total dose:  Cannot be calculated* Dosing weight:  112.5  *Administration dose not documented Date/Time   Rate/Dose/Volume Action  01/26/20 0722  *Not included in total MAR Hold    polyethylene glycol (MIRALAX / GLYCOLAX) packet 17 g (g) Total dose:  Cannot be calculated*  *Administration dose not documented Date/Time  Rate/Dose/Volume Action  01/26/20 0722  *Not included in total MAR Hold    sodium chloride flush (NS) 0.9 % injection 3 mL (mL) Total dose:  Cannot be calculated* Dosing weight:  112.5  *Administration dose not documented Date/Time  Rate/Dose/Volume Action  01/26/20 0722  *Not included in total MAR Hold    spironolactone (ALDACTONE) tablet 25 mg (mg) Total dose:  Cannot be calculated* Dosing weight:  112.5  *Administration dose not documented Date/Time  Rate/Dose/Volume Action  01/26/20 0722  *Not included in total MAR Hold    vancomycin (VANCOREADY) IVPB 2000 mg/400 mL (mL/hr) Total dose:  Cannot be calculated* Dosing weight:  112.5  *Administration dose not documented Date/Time  Rate/Dose/Volume Action  01/26/20 0722  *Not included in total MAR Hold    Sedation Time  Sedation Time Physician-1: 17 minutes 36 seconds  Contrast  Medication Name Total Dose  iohexol (OMNIPAQUE) 350 MG/ML injection 30 mL    Radiation/Fluoro  Fluoro time: 3.3 (min) DAP: 19071 (mGycm2) Cumulative Air Kerma: 360 (mGy)  Coronary Findings  Diagnostic Dominance: Right Left Anterior Descending  Collaterals  Mid LAD filled by collaterals from RPAV.    Ost LAD to Prox LAD lesion 100% stenosed  Ost LAD to Prox LAD lesion is 100% stenosed.  Right Coronary Artery  Right Posterior Descending Artery  RPDA lesion 20% stenosed  RPDA lesion is 20% stenosed.  Intervention  No interventions have been documented. Wall Motion  Resting    EF 20% (done in left lateral projection)        Coronary Diagrams  Diagnostic Dominance: Right  Intervention    Treatments: surgery:  01/30/2020   Patient:  Mina Marble Pre-Op Dx: Congestive heart failure with an EF of 20%                          One-vessel coronary artery disease Post-op Dx: Same Procedure: Off pump CABG X 1, LIMA LAD Intra-operative Transesophageal Echocardiogram  Surgeon and Role:      * Lightfoot, Eliezer Lofts, MD - Primary    *Gershon Crane, PA-C- assisting  Anesthesia  general EBL: 250 ml Blood Administration: None  Drains: 19 F blake drain: L, mediastinal  Wires: None Counts: correct  Discharge Exam: Blood pressure 100/64, pulse 69, temperature 97.6 F (36.4 C), temperature source Oral, resp. rate 19, height 6' (1.829 m), weight 101.2 kg, SpO2 94 %.  General appearance: alert, cooperative and no distress Heart: irregularly irregular rhythm Lungs: clear to auscultation bilaterally Abdomen: benign Extremities: ACE wraps in place Wound: incis healing well Disposition: Discharge disposition: 01-Home or Self Care       Discharge Instructions    Discharge patient   Complete by: As directed    Discharge disposition: 01-Home or Self Care   Discharge patient date: 02/08/2020   Face-to-face encounter (required for Medicare/Medicaid patients)   Complete by: As directed    I Robbie Lis certify that this patient is under my care and that I, or a nurse practitioner  or physician's assistant working with me, had a face-to-face encounter that meets the physician face-to-face encounter requirements with this patient on 01/26/2020. The encounter with the patient was in whole, or in part for the following medical condition(s) which is the primary reason for home health care (List medical condition): chronic systolic heart failure   The encounter with the patient was in whole, or in part, for the following medical condition, which is the primary reason for home health care: systolic heart failure   I certify that, based on my findings, the following services are medically necessary home health services: Nursing   Reason for Medically Necessary Home Health Services: Skilled Nursing-  Change/Decline in Patient Status   My clinical findings support the need for the above services: Shortness of breath with activity   Further, I certify that my clinical findings support that this patient is homebound due to: Shortness of Breath with activity   Home Health   Complete by: As directed    To provide the following care/treatments:  PT RN       Allergies as of 02/08/2020   No Known Allergies     Medication List    STOP taking these medications   ibuprofen 200 MG tablet Commonly known as: ADVIL     TAKE these medications   amiodarone 200 MG tablet Commonly known as: PACERONE Take 2 tablets (400 mg total) by mouth 2 (two) times daily. For 7 days, then 200 mg BID for 7 days, then 200 mg daily   apixaban 5 MG Tabs tablet Commonly known as: ELIQUIS Take 1 tablet (5 mg total) by mouth 2 (two) times daily.   aspirin 81 MG chewable tablet Chew 1 tablet (81 mg total) by mouth daily. Start taking on: Feb 09, 2020   atorvastatin 40 MG tablet Commonly known as: LIPITOR Take 1 tablet (40 mg total) by mouth daily. Start taking on: Feb 09, 2020   digoxin 0.125 MG tablet Commonly known as: LANOXIN Take 1 tablet (0.125 mg total) by mouth daily. Start taking on: Feb 09, 2020   hydrocerin Crea Apply 1 application topically 2 (two) times daily.   losartan 25 MG tablet Commonly known as: COZAAR Take 0.5 tablets (12.5 mg total) by mouth daily. Start taking on: Feb 09, 2020   spironolactone 25 MG tablet Commonly known as: ALDACTONE Take 0.5 tablets (12.5 mg total) by mouth daily. Start taking on: Feb 09, 2020   traMADol 50 MG tablet Commonly known as: ULTRAM Take 1 tablet (50 mg total) by mouth every 6 (six) hours as needed for moderate pain.            Durable Medical Equipment  (From admission, onward)         Start     Ordered   02/08/20 0808  Heart failure home health orders  (Heart failure home health orders / Face to face)  Once    Comments: Heart  Failure Follow-up Care:  Verify follow-up appointments per Patient Discharge Instructions. Confirm transportation arranged. Reconcile home medications with discharge medication list. Remove discontinued medications from use. Assist patient/caregiver to manage medications using pill box. Reinforce low sodium food selection Assessments: Vital signs and oxygen saturation at each visit. Assess home environment for safety concerns, caregiver support and availability of low-sodium foods. Consult Child psychotherapist, PT/OT, Dietitian, and CNA based on assessments. Perform comprehensive cardiopulmonary assessment. Notify MD for any change in condition or weight gain of 3 pounds in one day or 5 pounds in one  week with symptoms. Daily Weights and Symptom Monitoring: Wound care: Clean lower extremity wounds with soap and water , place eucerin cream 3x week Also dressing changes to Lower extremeties  Ensure patient has access to scales. Teach patient/caregiver to weigh daily before breakfast and after voiding using same scale and record.    Teach patient/caregiver to track weight and symptoms and when to notify Provider. Activity: Develop individualized activity plan with patient/caregiver.  Question Answer Comment  Heart Failure Follow-up Care [facility] at [phone]   Obtain the following labs Basic Metabolic Panel   Lab frequency Weekly   Fax lab results to [facility] Clinic at [phone]   Diet Low Sodium Heart Healthy   Fluid restrictions: 1800 mL Fluid   Skilled Nurse to notify MD of weight trends weekly for first 2 weeks. May fax or call: [facility] at [phone] (fax) or [phone]   Consult: Case manager      02/08/20 0817         Follow-up Information    [facility]. Go on 02/16/2020.   Why: :30pm Contact information: [address] [place] [phone]       [facility] Follow up on 02/19/2020.   Specialty: Cardiology Why: [facility] at 3:30  Parking Garage Code [phone]  Contact information: [address] [id number] [place] [address] [phone]       [facility] Follow up.   Specialty: Home Health Services Why: [facility] with charity Contact information: [address] [place] [address] [phone]        Corliss Skains, MD Follow up.   Specialty: Cardiothoracic Surgery Why: Please see discharge paperwork for follow-up appointment with your surgeon. Contact information: [address] [place] [address] [phone]          The patient has been discharged on:   1.Beta Blocker:  Yes [   ]                              No   [  n ]                              If No, reason:meds per AHF team 2.Ace Inhibitor/ARB: Yes Cove.Etienne   ]                                     No  [    ]                                     If No, reason:  3.Statin:   Yes [ y  ]                  No  [   ]                  If No, reason:  4.Ecasa:  Yes  [ y  ]                  No   [   ]  If No, reason: Signed: Rowe Clack PA-C 02/08/2020, 10:05 AM

## 2020-02-02 ENCOUNTER — Inpatient Hospital Stay: Payer: Self-pay

## 2020-02-02 ENCOUNTER — Inpatient Hospital Stay (HOSPITAL_COMMUNITY): Payer: Self-pay

## 2020-02-02 LAB — CBC
HCT: 36.4 % — ABNORMAL LOW (ref 39.0–52.0)
Hemoglobin: 12.1 g/dL — ABNORMAL LOW (ref 13.0–17.0)
MCH: 29.7 pg (ref 26.0–34.0)
MCHC: 33.2 g/dL (ref 30.0–36.0)
MCV: 89.2 fL (ref 80.0–100.0)
Platelets: 213 10*3/uL (ref 150–400)
RBC: 4.08 MIL/uL — ABNORMAL LOW (ref 4.22–5.81)
RDW: 14.7 % (ref 11.5–15.5)
WBC: 8.4 10*3/uL (ref 4.0–10.5)
nRBC: 0 % (ref 0.0–0.2)

## 2020-02-02 LAB — GLUCOSE, CAPILLARY
Glucose-Capillary: 112 mg/dL — ABNORMAL HIGH (ref 70–99)
Glucose-Capillary: 123 mg/dL — ABNORMAL HIGH (ref 70–99)
Glucose-Capillary: 123 mg/dL — ABNORMAL HIGH (ref 70–99)
Glucose-Capillary: 146 mg/dL — ABNORMAL HIGH (ref 70–99)
Glucose-Capillary: 82 mg/dL (ref 70–99)
Glucose-Capillary: 95 mg/dL (ref 70–99)

## 2020-02-02 LAB — COOXEMETRY PANEL
Carboxyhemoglobin: 1.7 % — ABNORMAL HIGH (ref 0.5–1.5)
Methemoglobin: 1.1 % (ref 0.0–1.5)
O2 Saturation: 59.3 %
Total hemoglobin: 13 g/dL (ref 12.0–16.0)

## 2020-02-02 LAB — BASIC METABOLIC PANEL
Anion gap: 10 (ref 5–15)
BUN: 15 mg/dL (ref 8–23)
CO2: 27 mmol/L (ref 22–32)
Calcium: 8.6 mg/dL — ABNORMAL LOW (ref 8.9–10.3)
Chloride: 99 mmol/L (ref 98–111)
Creatinine, Ser: 0.74 mg/dL (ref 0.61–1.24)
GFR calc Af Amer: >60 mL/min (ref >60)
GFR calc non Af Amer: >60 mL/min (ref >60)
Glucose, Bld: 91 mg/dL (ref 70–99)
Potassium: 4.1 mmol/L (ref 3.5–5.1)
Sodium: 136 mmol/L (ref 135–145)

## 2020-02-02 MED ORDER — POTASSIUM CHLORIDE CRYS ER 20 MEQ PO TBCR
40.0000 meq | EXTENDED_RELEASE_TABLET | Freq: Once | ORAL | Status: AC
  Administered 2020-02-02: 40 meq via ORAL
  Filled 2020-02-02: qty 2

## 2020-02-02 MED ORDER — ~~LOC~~ CARDIAC SURGERY, PATIENT & FAMILY EDUCATION: Freq: Once | Status: AC

## 2020-02-02 MED ORDER — INSULIN ASPART 100 UNIT/ML ~~LOC~~ SOLN
0.0000 [IU] | Freq: Three times a day (TID) | SUBCUTANEOUS | Status: DC
  Administered 2020-02-02 – 2020-02-04 (×4): 2 [IU] via SUBCUTANEOUS

## 2020-02-02 MED ORDER — SODIUM CHLORIDE 0.9 % IV SOLN: 250.0000 mL | INTRAVENOUS | Status: DC | PRN

## 2020-02-02 MED ORDER — METOPROLOL SUCCINATE ER 25 MG PO TB24
12.5000 mg | ORAL_TABLET | Freq: Every day | ORAL | Status: DC
  Administered 2020-02-02 – 2020-02-03 (×2): 12.5 mg via ORAL
  Filled 2020-02-02 (×2): qty 1

## 2020-02-02 MED ORDER — CHLORHEXIDINE GLUCONATE CLOTH 2 % EX PADS
6.0000 | MEDICATED_PAD | Freq: Every morning | CUTANEOUS | Status: DC
  Administered 2020-02-03 – 2020-02-08 (×6): 6 via TOPICAL

## 2020-02-02 MED ORDER — AMIODARONE LOAD VIA INFUSION
150.0000 mg | Freq: Once | INTRAVENOUS | Status: AC
  Administered 2020-02-02: 06:00:00 150 mg via INTRAVENOUS
  Filled 2020-02-02: qty 83.34

## 2020-02-02 MED ORDER — AMIODARONE HCL IN DEXTROSE 360-4.14 MG/200ML-% IV SOLN
30.0000 mg/h | INTRAVENOUS | Status: DC
  Administered 2020-02-02 – 2020-02-07 (×8): 30 mg/h via INTRAVENOUS
  Filled 2020-02-02 (×6): qty 200
  Filled 2020-02-02: qty 400
  Filled 2020-02-02 (×4): qty 200

## 2020-02-02 MED ORDER — FUROSEMIDE 10 MG/ML IJ SOLN
80.0000 mg | Freq: Two times a day (BID) | INTRAMUSCULAR | Status: DC
  Administered 2020-02-02 – 2020-02-03 (×3): 80 mg via INTRAVENOUS
  Filled 2020-02-02 (×3): qty 8

## 2020-02-02 MED ORDER — LOSARTAN POTASSIUM 25 MG PO TABS
12.5000 mg | ORAL_TABLET | Freq: Every day | ORAL | Status: DC
  Administered 2020-02-02 – 2020-02-08 (×7): 12.5 mg via ORAL
  Filled 2020-02-02 (×7): qty 1

## 2020-02-02 MED ORDER — SODIUM CHLORIDE 0.9% FLUSH: 10.0000 mL | INTRAVENOUS | Status: DC | PRN

## 2020-02-02 MED ORDER — AMIODARONE HCL IN DEXTROSE 360-4.14 MG/200ML-% IV SOLN
60.0000 mg/h | INTRAVENOUS | Status: DC
  Administered 2020-02-02: 07:00:00 60 mg/h via INTRAVENOUS

## 2020-02-02 MED ORDER — SODIUM CHLORIDE 0.9% FLUSH
3.0000 mL | Freq: Two times a day (BID) | INTRAVENOUS | Status: DC
  Administered 2020-02-02 – 2020-02-07 (×5): 3 mL via INTRAVENOUS

## 2020-02-02 MED ORDER — SODIUM CHLORIDE 0.9% FLUSH: 3.0000 mL | INTRAVENOUS | Status: DC | PRN

## 2020-02-02 MED ORDER — SODIUM CHLORIDE 0.9% FLUSH
10.0000 mL | Freq: Two times a day (BID) | INTRAVENOUS | Status: DC
  Administered 2020-02-02 – 2020-02-08 (×8): 10 mL

## 2020-02-02 NOTE — Progress Notes (Signed)
Peripherally Inserted Central Catheter Placement  The IV Nurse has discussed with the patient and/or persons authorized to consent for the patient, the purpose of this procedure and the potential benefits and risks involved with this procedure.  The benefits include less needle sticks, lab draws from the catheter, and the patient may be discharged home with the catheter. Risks include, but not limited to, infection, bleeding, blood clot (thrombus formation), and puncture of an artery; nerve damage and irregular heartbeat and possibility to perform a PICC exchange if needed/ordered by physician.  Alternatives to this procedure were also discussed.  Bard Power PICC patient education guide, fact sheet on infection prevention and patient information card has been provided to patient /or left at bedside.    PICC Placement Documentation  PICC Double Lumen 02/02/20 PICC Right Basilic 41 cm 1 cm (Active)  Indication for Insertion or Continuance of Line Vasoactive infusions 02/02/20 1114  Exposed Catheter (cm) 1 cm 02/02/20 1114  Site Assessment Clean;Dry;Intact 02/02/20 1114  Lumen #1 Status Flushed;Blood return noted 02/02/20 1114  Lumen #2 Status Flushed;Blood return noted 02/02/20 1114  Dressing Type Transparent 02/02/20 1114  Dressing Status Clean;Dry;Intact;Antimicrobial disc in place;Other (Comment) 02/02/20 1114  Dressing Intervention New dressing 02/02/20 1114  Dressing Change Due 02/09/20 02/02/20 1114       [name] 02/02/2020, 11:16 AM

## 2020-02-02 NOTE — Progress Notes (Signed)
      [address]       [place] [address]             [phone]                 3 Days Post-Op Procedure(s) (LRB): OFF PUMP CORONARY ARTERY BYPASS GRAFTING (CABG) TIMES ONE (N/A) TRANSESOPHAGEAL ECHOCARDIOGRAM (TEE) (N/A)   Events: Feels better today afib overnight, started on amio gtt _______________________________________________________________ Vitals: BP 123/83   Pulse (!) 131   Temp 98.2 F (36.8 C)   Resp (!) 25   Ht 6' (1.829 m)   Wt 101.8 kg   SpO2 92%   BMI 30.44 kg/m   - Neuro: alert NAD  - Cardiovascular: back in sinus  Drips: amio 60    - Pulm: EWOB off [place]    ABG    Component Value Date/Time   PHART 7.328 (L) 01/30/2020 1513   PCO2ART 47.4 01/30/2020 1513   PO2ART 141 (H) 01/30/2020 1513   HCO3 25.1 01/30/2020 1513   TCO2 27 01/30/2020 1513   ACIDBASEDEF 2.0 01/30/2020 1513   O2SAT 59.3 02/02/2020 0418    - Abd: soft - Extremity: warm  .Intake/Output      05/06 0701 - 05/07 0700 05/07 0701 - 05/08 0700   P.O. 1060    I.V. (mL/kg) 602.1 (5.9)    IV Piggyback     Total Intake(mL/kg) 1662.1 (16.3)    Urine (mL/kg/hr) 2851 (1.2)    Chest Tube     Total Output 2851    Net -1188.9         Urine Occurrence 1 x       _______________________________________________________________ Labs: CBC Latest Ref Rng & Units 02/02/2020 02/01/2020 01/31/2020  WBC 4.0 - 10.5 K/uL 8.4 11.2(H) 12.8(H)  Hemoglobin 13.0 - 17.0 g/dL 12.1(L) 12.6(L) 14.5  Hematocrit 39.0 - 52.0 % 36.4(L) 37.8(L) 43.3  Platelets 150 - 400 K/uL 213 193 235   CMP Latest Ref Rng & Units 02/02/2020 02/01/2020 01/31/2020  Glucose 70 - 99 mg/dL 91 673(A) 193(X)  BUN 8 - 23 mg/dL 15 18 22   Creatinine 0.61 - 1.24 mg/dL 9.02 4.09  Sodium 135 - 145 mmol/L 136 129(L) 131(L)  Potassium 3.5 - 5.1 mmol/L 4.1 4.2 4.3  Chloride 98 - 111 mmol/L 99 101 99  CO2 22 - 32 mmol/L 27 25 24   Calcium 8.9 - 10.3 mg/dL 7.35) 8.1(L) 9.0  Total Protein 6.5 - 8.1 g/dL - - -  Total  Bilirubin 0.3 - 1.2 mg/dL - - -  Alkaline Phos 38 - 126 U/L - - -  AST 15 - 41 U/L - - -  ALT 0 - 44 U/L - - -    CXR: stable  _______________________________________________________________  Assessment and Plan: POD 3 s/p offCabg 1  Neuro: pain improved CV: on amio.  Back in sinus.  Would start eliquis if he goes back into afib.  No need for anticoagulation right now Pulm: continue pulm toilet Renal: stable. goog uop GI: on diet Heme: stable ID: afebrile, no WBC Endo: SSI Dispo: will transfer to [facility]  3.2(D, MD 02/02/2020 8:44 AM

## 2020-02-02 NOTE — Plan of Care (Signed)
  Problem: Education: Goal: Knowledge of General Education information will improve Description: Including pain rating scale, medication(s)/side effects and non-pharmacologic comfort measures Outcome: Progressing   Problem: Health Behavior/Discharge Planning: Goal: Ability to manage health-related needs will improve Outcome: Progressing   Problem: Clinical Measurements: Goal: Ability to maintain clinical measurements within normal limits will improve Outcome: Progressing Goal: Will remain free from infection Outcome: Progressing Goal: Diagnostic test results will improve Outcome: Progressing Goal: Cardiovascular complication will be avoided Outcome: Progressing   Problem: Activity: Goal: Risk for activity intolerance will decrease Outcome: Progressing   Problem: Skin Integrity: Goal: Risk for impaired skin integrity will decrease Outcome: Progressing   Problem: Education: Goal: Ability to demonstrate management of disease process will improve Outcome: Progressing Goal: Ability to verbalize understanding of medication therapies will improve Outcome: Progressing Goal: Individualized Educational Video(s) Outcome: Progressing   Problem: Activity: Goal: Capacity to carry out activities will improve Outcome: Progressing   Problem: Cardiac: Goal: Ability to achieve and maintain adequate cardiopulmonary perfusion will improve Outcome: Progressing   Problem: Education: Goal: Will demonstrate proper wound care and an understanding of methods to prevent future damage Outcome: Progressing Goal: Knowledge of disease or condition will improve Outcome: Progressing Goal: Knowledge of the prescribed therapeutic regimen will improve Outcome: Progressing Goal: Individualized Educational Video(s) Outcome: Progressing   Problem: Activity: Goal: Risk for activity intolerance will decrease Outcome: Progressing   Problem: Cardiac: Goal: Will achieve and/or maintain hemodynamic  stability Outcome: Progressing   Problem: Clinical Measurements: Goal: Postoperative complications will be avoided or minimized Outcome: Progressing   Problem: Respiratory: Goal: Respiratory status will improve Outcome: Progressing   Problem: Skin Integrity: Goal: Wound healing without signs and symptoms of infection Outcome: Progressing Goal: Risk for impaired skin integrity will decrease Outcome: Progressing   Problem: Urinary Elimination: Goal: Ability to achieve and maintain adequate renal perfusion and functioning will improve Outcome: Progressing

## 2020-02-02 NOTE — Progress Notes (Addendum)
Advanced Heart Failure Rounding Note  PCP-Cardiologist: No primary care provider on file.   Subjective:    S/p Off pump CABG x 1 w/  LIMA-LAD 5/4.   Yesterday milrinone cut back to 0.125 mcg. CO-OX 59%.  Also diuresed with IV lasix. Weight down another 3 pounds.   Overnight he went in A fib RVR. Started on amio drip. Now back in NSR  Denies SOB. Complaining of chest soreness.    Objective:   Weight Range: 101.8 kg Body mass index is 30.44 kg/m.   Vital Signs:   Temp:  [97.4 F (36.3 C)-98.6 F (37 C)] 98.2 F (36.8 C) (05/07 0744) Pulse Rate:  [46-131] 131 (05/07 0700) Resp:  [16-25] 25 (05/07 0700) BP: (96-124)/(56-100) 123/83 (05/07 0700) SpO2:  [92 %-99 %] 92 % (05/07 0700) Arterial Line BP: (119-138)/(52-67) 138/67 (05/06 1200) Weight:  [101.8 kg] 101.8 kg (05/07 0500) Last BM Date: 01/28/20  Weight change: Filed Weights   01/31/20 0500 02/01/20 0600 02/02/20 0500  Weight: 102.8 kg 103.2 kg 101.8 kg    Intake/Output:   Intake/Output Summary (Last 24 hours) at 02/02/2020 0753 Last data filed at 02/02/2020 0700 Gross per 24 hour  Intake 1662.14 ml  Output 2851 ml  Net -1188.86 ml      Physical Exam  CVP 13 General:  No resp difficulty. Sitting in the chair.  HEENT: normal anicteric Neck: supple. JVP 11-12 . Carotids 2+ bilat; no bruits. No lymphadenopathy or thryomegaly appreciated. LIJ introducer Cor: PMI nondisplaced.RRR. No rubs, gallops or murmurs.Sternal incision approximated.  Lungs: clear decreased at bases.  Abdomen: soft, nontender, nondistended. No hepatosplenomegaly. No bruits or masses. Good bowel sounds. Extremities: no cyanosis, clubbing, rash, R and LLE ace wraps. Mild edema  Neuro: alert & oriented x 3, cranial nerves grossly intact. moves all 4 extremities w/o difficulty. Affect pleasant   Telemetry    Afib 90-140s overnight. Now back in NSR. Personally reviewed   EKG    n/a  Labs    CBC Recent Labs    02/01/20 0421  02/02/20 0418  WBC 11.2* 8.4  HGB 12.6* 12.1*  HCT 37.8* 36.4*  MCV 89.4 89.2  PLT 193 762   Basic Metabolic Panel Recent Labs    01/31/20 0312 01/31/20 1120 01/31/20 1738 01/31/20 1738 02/01/20 0421 02/02/20 0418  NA 134*  --  131*   < > 129* 136  K 4.7  --  4.3   < > 4.2 4.1  CL 101  --  99   < > 101 99  CO2 24  --  24   < > 25 27  GLUCOSE 140*  --  146*   < > 111* 91  BUN 18  --  22   < > 18 15  CREATININE 0.82   < > 0.84   < > 0.64 0.74  CALCIUM 9.0  --  9.0   < > 8.1* 8.6*  MG 2.2  --  2.0  --   --   --    < > = values in this interval not displayed.   Liver Function Tests No results for input(s): AST, ALT, ALKPHOS, BILITOT, PROT, ALBUMIN in the last 72 hours. No results for input(s): LIPASE, AMYLASE in the last 72 hours. Cardiac Enzymes No results for input(s): CKTOTAL, CKMB, CKMBINDEX, TROPONINI in the last 72 hours.  BNP: BNP (last 3 results) Recent Labs    01/23/20 0338  BNP 1,370.5*    ProBNP (last 3 results) No  results for input(s): PROBNP in the last 8760 hours.   D-Dimer No results for input(s): DDIMER in the last 72 hours. Hemoglobin A1C No results for input(s): HGBA1C in the last 72 hours. Fasting Lipid Panel No results for input(s): CHOL, HDL, LDLCALC, TRIG, CHOLHDL, LDLDIRECT in the last 72 hours. Thyroid Function Tests No results for input(s): TSH, T4TOTAL, T3FREE, THYROIDAB in the last 72 hours.  Invalid input(s): FREET3  Other results:   Imaging    No results found.   Medications:     Scheduled Medications: . acetaminophen  1,000 mg Oral Q6H   Or  . acetaminophen (TYLENOL) oral liquid 160 mg/5 mL  1,000 mg Per Tube Q6H  . aspirin EC  325 mg Oral Daily   Or  . aspirin  324 mg Per Tube Daily  . atorvastatin  40 mg Oral Daily  . bisacodyl  10 mg Oral Daily   Or  . bisacodyl  10 mg Rectal Daily  . Chlorhexidine Gluconate Cloth  6 each Topical Daily  . docusate sodium  200 mg Oral Daily  . enoxaparin (LOVENOX) injection   40 mg Subcutaneous QHS  . insulin aspart  0-24 Units Subcutaneous Q4H  . mouth rinse  15 mL Mouth Rinse BID  . metoprolol tartrate  12.5 mg Oral BID   Or  . metoprolol tartrate  12.5 mg Per Tube BID  . pantoprazole  40 mg Oral Daily  . sodium chloride flush  10-40 mL Intracatheter Q12H  . sodium chloride flush  3 mL Intravenous Q12H  . spironolactone  12.5 mg Oral Daily    Infusions: . sodium chloride Stopped (01/31/20 1744)  . sodium chloride    . sodium chloride 20 mL/hr at 01/30/20 2000  . amiodarone 60 mg/hr (02/02/20 0630)   Followed by  . amiodarone    . dexmedetomidine (PRECEDEX) IV infusion Stopped (01/30/20 1234)  . lactated ringers    . lactated ringers 20 mL/hr at 02/02/20 0700  . milrinone 0.125 mcg/kg/min (02/02/20 0700)  . niCARDipine Stopped (01/31/20 0617)    PRN Medications: sodium chloride, metoprolol tartrate, midazolam, morphine injection, ondansetron (ZOFRAN) IV, oxyCODONE, sodium chloride flush, sodium chloride flush, traMADol     Assessment/Plan   1. Acute Systolic Heart Failure ECHO EF 15% with severe global HK, and moderate RV dysfunction.  Etiology possible ETOH versus HTN (denies severe ETOH use). TSH was ok. HIV NR  -4/28 LHC with severe 1v CAD. RHC with preserved cardiac output.  - S/p CABG x 1 w/ LIMA-LAD 5/4 - CO-OX 59%. Stop milrinone.  - Volume status improving. CVP 13. Give 80 mg IV lasix twice a day. - Stop metoprolol tartrate with low ef and start toprol 12.5 mg daily.   - Continue spironolactone to 12.5 mg daily.  -Add 12.5 mg losartan daily. Cpnsider entresto soon --HF Outpt Pharm aware he will need patient assistance.  - consider future addition of a SGLT2i  - Renal function stable.  - Place PICC  2. CAD:  - LHC with severe 1VCAD - S/p CABG x 1 w/ LIMA-LAD 5/4 - continue post operative management per CT surgery - continue ASA 325 mg - on statin, atorvastatin 40 mg - on  blocker, but with low EF stop metoprolol tartrate and  start toprol xl 12.5 mg daily.   3. Cellulits , R and LLE legs: - Partial Thickness wound with palpable pedal pulses.  - WBC down-trending, 11 today. AF.  Started keflex 4/27-->Switched  to vancomycin on 4/28. Now on  post-op abx, Zinacef  - WOC appreciated  4. ? H/O ETOH Abuse: - denies heavy use  5. Hematuria - continues to have hematuria. May be traumatic from recent foley placement. Hgb down from 14.5>>12.6>>12.1  - Resolved.   6. Hyponatremia - Resolved.   7. A fib RVR -Started on amiodarone this morning. Now back in NSR - will not start [facility] unless AF recurs   8 Social:  - He has no insurance so we will need to provide 30 days of HF meds at discharge.  Discussed with HF Pharmacy and HF SW for med assist. Referred to HF Paramedicine.      Length of Stay: 10  Tonye Becket, NP  02/02/2020, 7:53 AM  Advanced Heart Failure Team Pager [phone] (M-F; 7a - 4p)  Please contact [facility] for night-coverage after hours (4p -7a ) and weekends on [email or web address]  Patient seen and examined with the above-signed Advanced Practice Provider and/or Housestaff. I personally reviewed laboratory data, imaging studies and relevant notes. I independently examined the patient and formulated the important aspects of the plan. I have edited the note to reflect any of my changes or salient points. I have personally discussed the plan with the patient and/or family.  Continues to improve, had AF overnight but now back in NSR on IV amio. Co-ox 59% can stop milrinone but would follow co-ox closely. (Can pull introducer and place PICC) Remains volume overloaded. Agree with IV diuresis.   Exam as above (edited)  Can go to 2C or 4e.    D/w Dr. Cliffton Asters.   Arvilla Meres, MD  9:13 AM

## 2020-02-03 LAB — BASIC METABOLIC PANEL
Anion gap: 9 (ref 5–15)
BUN: 15 mg/dL (ref 8–23)
CO2: 29 mmol/L (ref 22–32)
Calcium: 8.6 mg/dL — ABNORMAL LOW (ref 8.9–10.3)
Chloride: 99 mmol/L (ref 98–111)
Creatinine, Ser: 0.77 mg/dL (ref 0.61–1.24)
GFR calc Af Amer: >60 mL/min (ref >60)
GFR calc non Af Amer: >60 mL/min (ref >60)
Glucose, Bld: 113 mg/dL — ABNORMAL HIGH (ref 70–99)
Potassium: 3.6 mmol/L (ref 3.5–5.1)
Sodium: 137 mmol/L (ref 135–145)

## 2020-02-03 LAB — CBC
HCT: 38.3 % — ABNORMAL LOW (ref 39.0–52.0)
Hemoglobin: 12.6 g/dL — ABNORMAL LOW (ref 13.0–17.0)
MCH: 29.6 pg (ref 26.0–34.0)
MCHC: 32.9 g/dL (ref 30.0–36.0)
MCV: 89.9 fL (ref 80.0–100.0)
Platelets: 270 10*3/uL (ref 150–400)
RBC: 4.26 MIL/uL (ref 4.22–5.81)
RDW: 15.2 % (ref 11.5–15.5)
WBC: 7.6 10*3/uL (ref 4.0–10.5)
nRBC: 0 % (ref 0.0–0.2)

## 2020-02-03 LAB — GLUCOSE, CAPILLARY
Glucose-Capillary: 102 mg/dL — ABNORMAL HIGH (ref 70–99)
Glucose-Capillary: 104 mg/dL — ABNORMAL HIGH (ref 70–99)
Glucose-Capillary: 132 mg/dL — ABNORMAL HIGH (ref 70–99)
Glucose-Capillary: 106 mg/dL — ABNORMAL HIGH (ref 70–99)

## 2020-02-03 LAB — COOXEMETRY PANEL
Carboxyhemoglobin: 0.8 % (ref 0.5–1.5)
Methemoglobin: 0.3 % (ref 0.0–1.5)
O2 Saturation: 48.9 %
Total hemoglobin: 13.9 g/dL (ref 12.0–16.0)

## 2020-02-03 MED ORDER — POTASSIUM CHLORIDE CRYS ER 20 MEQ PO TBCR
40.0000 meq | EXTENDED_RELEASE_TABLET | Freq: Once | ORAL | Status: AC
  Administered 2020-02-03: 40 meq via ORAL
  Filled 2020-02-03: qty 2

## 2020-02-03 MED ORDER — POTASSIUM CHLORIDE CRYS ER 20 MEQ PO TBCR: 40.0000 meq | EXTENDED_RELEASE_TABLET | Freq: Once | ORAL | Status: DC

## 2020-02-03 MED ORDER — APIXABAN 5 MG PO TABS
5.0000 mg | ORAL_TABLET | Freq: Two times a day (BID) | ORAL | Status: DC
  Administered 2020-02-03 – 2020-02-08 (×11): 5 mg via ORAL
  Filled 2020-02-03 (×11): qty 1

## 2020-02-03 MED ORDER — METOPROLOL TARTRATE 25 MG PO TABS
25.0000 mg | ORAL_TABLET | Freq: Two times a day (BID) | ORAL | Status: DC
  Administered 2020-02-03 – 2020-02-04 (×4): 25 mg via ORAL
  Filled 2020-02-03 (×4): qty 1

## 2020-02-03 MED ORDER — ASPIRIN 81 MG PO CHEW
81.0000 mg | CHEWABLE_TABLET | Freq: Every day | ORAL | Status: DC
  Administered 2020-02-04 – 2020-02-08 (×5): 81 mg via ORAL
  Filled 2020-02-03 (×5): qty 1

## 2020-02-03 NOTE — Plan of Care (Signed)
  Problem: Education: Goal: Knowledge of General Education information will improve Description: Including pain rating scale, medication(s)/side effects and non-pharmacologic comfort measures Outcome: Progressing   Problem: Health Behavior/Discharge Planning: Goal: Ability to manage health-related needs will improve Outcome: Progressing   Problem: Clinical Measurements: Goal: Ability to maintain clinical measurements within normal limits will improve Outcome: Progressing Goal: Will remain free from infection Outcome: Progressing Goal: Diagnostic test results will improve Outcome: Progressing Goal: Cardiovascular complication will be avoided Outcome: Progressing   Problem: Activity: Goal: Risk for activity intolerance will decrease Outcome: Progressing   Problem: Skin Integrity: Goal: Risk for impaired skin integrity will decrease Outcome: Progressing   Problem: Education: Goal: Ability to demonstrate management of disease process will improve Outcome: Progressing Goal: Ability to verbalize understanding of medication therapies will improve Outcome: Progressing Goal: Individualized Educational Video(s) Outcome: Progressing   Problem: Activity: Goal: Capacity to carry out activities will improve Outcome: Progressing   Problem: Cardiac: Goal: Ability to achieve and maintain adequate cardiopulmonary perfusion will improve Outcome: Progressing   Problem: Education: Goal: Will demonstrate proper wound care and an understanding of methods to prevent future damage Outcome: Progressing Goal: Knowledge of disease or condition will improve Outcome: Progressing Goal: Knowledge of the prescribed therapeutic regimen will improve Outcome: Progressing Goal: Individualized Educational Video(s) Outcome: Progressing   Problem: Activity: Goal: Risk for activity intolerance will decrease Outcome: Progressing   Problem: Cardiac: Goal: Will achieve and/or maintain hemodynamic  stability Outcome: Progressing   Problem: Clinical Measurements: Goal: Postoperative complications will be avoided or minimized Outcome: Progressing   Problem: Respiratory: Goal: Respiratory status will improve Outcome: Progressing   Problem: Skin Integrity: Goal: Wound healing without signs and symptoms of infection Outcome: Progressing Goal: Risk for impaired skin integrity will decrease Outcome: Progressing   Problem: Urinary Elimination: Goal: Ability to achieve and maintain adequate renal perfusion and functioning will improve Outcome: Progressing   

## 2020-02-03 NOTE — Progress Notes (Signed)
Advanced Heart Failure Rounding Note  PCP-Cardiologist: No primary care provider on file.   Subjective:    S/p Off pump CABG x 1 w/  LIMA-LAD 5/4.   Yesterday milrinone stopped. No co-ox today. Feels ok walking halls.   Diuresed well. Weight within 2 pounds of baseline. CVP 2-3  Developed recurrent AFL overnight. IV amio and Eliquis started. Rate 130 -> 110    Objective:   Weight Range: 100.3 kg Body mass index is 29.99 kg/m.   Vital Signs:   Temp:  [97.6 F (36.4 C)-98.3 F (36.8 C)] 97.7 F (36.5 C) (05/08 1200) Pulse Rate:  [51-117] 117 (05/08 1200) Resp:  [15-23] 20 (05/08 1200) BP: (88-111)/(60-80) 101/79 (05/08 1200) SpO2:  [97 %-100 %] 97 % (05/08 1200) Weight:  [100.3 kg] 100.3 kg (05/08 0500) Last BM Date: 02/03/20  Weight change: Filed Weights   02/01/20 0600 02/02/20 0500 02/03/20 0500  Weight: 103.2 kg 101.8 kg 100.3 kg    Intake/Output:   Intake/Output Summary (Last 24 hours) at 02/03/2020 1320 Last data filed at 02/03/2020 1200 Gross per 24 hour  Intake 1167.81 ml  Output 1900 ml  Net -732.19 ml      Physical Exam   General:  Sitting up in chair. No resp difficulty HEENT: normal Neck: supple. no JVD. Carotids 2+ bilat; no bruits. No lymphadenopathy or thryomegaly appreciated. Cor: Sternal wound ok PMI nondisplaced. Irregular tachy No rubs, gallops or murmurs. Lungs: clear Abdomen: soft, nontender, nondistended. No hepatosplenomegaly. No bruits or masses. Good bowel sounds. Extremities: no cyanosis, clubbing, rash, tr edema + UNNA Neuro: alert & orientedx3, cranial nerves grossly intact. moves all 4 extremities w/o difficulty. Affect pleasant   Telemetry    AFL 130 -> 110 Personally reviewed   Labs    CBC Recent Labs    02/02/20 0418 02/03/20 0429  WBC 8.4 7.6  HGB 12.1* 12.6*  HCT 36.4* 38.3*  MCV 89.2 89.9  PLT 213 956   Basic Metabolic Panel Recent Labs    01/31/20 1738 02/01/20 0421 02/02/20 0418 02/03/20 0429    NA 131*   < > 136 137  K 4.3   < > 4.1 3.6  CL 99   < > 99 99  CO2 24   < > 27 29  GLUCOSE 146*   < > 91 113*  BUN 22   < > 15 15  CREATININE 0.84   < > 0.74 0.77  CALCIUM 9.0   < > 8.6* 8.6*  MG 2.0  --   --   --    < > = values in this interval not displayed.   Liver Function Tests No results for input(s): AST, ALT, ALKPHOS, BILITOT, PROT, ALBUMIN in the last 72 hours. No results for input(s): LIPASE, AMYLASE in the last 72 hours. Cardiac Enzymes No results for input(s): CKTOTAL, CKMB, CKMBINDEX, TROPONINI in the last 72 hours.  BNP: BNP (last 3 results) Recent Labs    01/23/20 0338  BNP 1,370.5*    ProBNP (last 3 results) No results for input(s): PROBNP in the last 8760 hours.   D-Dimer No results for input(s): DDIMER in the last 72 hours. Hemoglobin A1C No results for input(s): HGBA1C in the last 72 hours. Fasting Lipid Panel No results for input(s): CHOL, HDL, LDLCALC, TRIG, CHOLHDL, LDLDIRECT in the last 72 hours. Thyroid Function Tests No results for input(s): TSH, T4TOTAL, T3FREE, THYROIDAB in the last 72 hours.  Invalid input(s): FREET3  Other results:   Imaging  No results found.   Medications:     Scheduled Medications: . acetaminophen  1,000 mg Oral Q6H   Or  . acetaminophen (TYLENOL) oral liquid 160 mg/5 mL  1,000 mg Per Tube Q6H  . apixaban  5 mg Oral BID  . [START ON 02/04/2020] aspirin  81 mg Oral Daily  . atorvastatin  40 mg Oral Daily  . bisacodyl  10 mg Oral Daily   Or  . bisacodyl  10 mg Rectal Daily  . Chlorhexidine Gluconate Cloth  6 each Topical q morning - 10a  . docusate sodium  200 mg Oral Daily  . furosemide  80 mg Intravenous BID  . insulin aspart  0-24 Units Subcutaneous TID AC & HS  . losartan  12.5 mg Oral Daily  . mouth rinse  15 mL Mouth Rinse BID  . metoprolol tartrate  25 mg Oral BID  . pantoprazole  40 mg Oral Daily  . sodium chloride flush  10-40 mL Intracatheter Q12H  . sodium chloride flush  3 mL  Intravenous Q12H  . sodium chloride flush  3 mL Intravenous Q12H  . spironolactone  12.5 mg Oral Daily    Infusions: . sodium chloride Stopped (02/02/20 1300)  . amiodarone 30 mg/hr (02/03/20 1200)    PRN Medications: sodium chloride, metoprolol tartrate, midazolam, morphine injection, ondansetron (ZOFRAN) IV, oxyCODONE, sodium chloride flush, sodium chloride flush, sodium chloride flush, traMADol     Assessment/Plan   1. Acute Systolic Heart Failure ECHO EF 15% with severe global HK, and moderate RV dysfunction.  Etiology possible ETOH versus HTN (denies severe ETOH use). TSH was ok. HIV NR  -4/28 LHC with severe 1v CAD. RHC with preserved cardiac output.  - S/p CABG x 1 w/ LIMA-LAD 5/4 - Milrinone stopped 5/7. Check co-ox - Volume status low with CVP 2-3. Stop lasix  - Continue spiro 12.5, toprol 25 and losartan 12.5. BP too low to titrate - Renal function stable.   2. CAD:  - LHC with severe 1VCAD - S/p CABG x 1 w/ LIMA-LAD 5/4 - continue post operative management per CT surgery - continue ASA 325 mg - on statin, atorvastatin 40 mg, b-blocker  3. Cellulits , R and LLE legs: - Partial Thickness wound with palpable pedal pulses.  - Improved. - WOC has seen  4. ? H/O ETOH Abuse: - denies heavy use  5. Hematuria - resolved  6. Hyponatremia - Resolved.   7. A fib RVR - rate improving with IV amio. Will continue. Agree with Eliquis  8 Social:  - He has no insurance so we will need to provide 30 days of HF meds at discharge.  Discussed with HF Pharmacy and HF SW for med assist. Referred to HF Paramedicine.     Length of Stay: 48  Arvilla Meres, MD  02/03/2020, 1:20 PM  Advanced Heart Failure Team Pager [phone] (M-F; 7a - 4p)  Please contact [facility] for night-coverage after hours (4p -7a ) and weekends on [email or web address]

## 2020-02-03 NOTE — Progress Notes (Addendum)
[address]       [place] [address]             [phone]      4 Days Post-Op Procedure(s) (LRB): OFF PUMP CORONARY ARTERY BYPASS GRAFTING (CABG) TIMES ONE (N/A) TRANSESOPHAGEAL ECHOCARDIOGRAM (TEE) (N/A) Subjective: Feels ok, + afib with RVR- on IV amio currently  Objective: Vital signs in last 24 hours: Temp:  [97.6 F (36.4 C)-98.3 F (36.8 C)] 97.6 F (36.4 C) (05/08 0747) Pulse Rate:  [49-113] 51 (05/08 1001) Cardiac Rhythm: Sinus tachycardia (05/08 1001) Resp:  [15-25] 20 (05/08 1001) BP: (88-111)/(60-80) 107/69 (05/08 1001) SpO2:  [95 %-100 %] 98 % (05/08 1001) Weight:  [100.3 kg] 100.3 kg (05/08 0500)  Hemodynamic parameters for last 24 hours: CVP:  [2 mmHg-7 mmHg] 6 mmHg  Intake/Output from previous day: 05/07 0701 - 05/08 0700 In: 647.1 [P.O.:125; I.V.:522.1] Out: 1900 [Urine:1900] Intake/Output this shift: Total I/O In: 300 [P.O.:300] Out: 1225 [Urine:1225]  General appearance: alert, cooperative and no distress Heart: irregularly irregular rhythm and tachy Lungs: clear to auscultation bilaterally Abdomen: benign Extremities: ace wraps in place Wound: incis healing well  Lab Results: Recent Labs    02/02/20 0418 02/03/20 0429  WBC 8.4 7.6  HGB 12.1* 12.6*  HCT 36.4* 38.3*  PLT 213 270   BMET:  Recent Labs    02/02/20 0418 02/03/20 0429  NA 136 137  K 4.1 3.6  CL 99 99  CO2 27 29  GLUCOSE 91 113*  BUN 15 15  CREATININE 0.74 0.77  CALCIUM 8.6* 8.6*    PT/INR: No results for input(s): LABPROT, INR in the last 72 hours. ABG    Component Value Date/Time   PHART 7.328 (L) 01/30/2020 1513   HCO3 25.1 01/30/2020 1513   TCO2 27 01/30/2020 1513   ACIDBASEDEF 2.0 01/30/2020 1513   O2SAT 59.3 02/02/2020 0418   CBG (last 3)  Recent Labs    02/02/20 2115 02/03/20 0604 02/03/20 1138  GLUCAP 123* 102* 104*    Meds Scheduled Meds: . acetaminophen  1,000 mg Oral Q6H   Or  . acetaminophen (TYLENOL) oral  liquid 160 mg/5 mL  1,000 mg Per Tube Q6H  . aspirin EC  325 mg Oral Daily   Or  . aspirin  324 mg Per Tube Daily  . atorvastatin  40 mg Oral Daily  . bisacodyl  10 mg Oral Daily   Or  . bisacodyl  10 mg Rectal Daily  . Chlorhexidine Gluconate Cloth  6 each Topical q morning - 10a  . docusate sodium  200 mg Oral Daily  . enoxaparin (LOVENOX) injection  40 mg Subcutaneous QHS  . furosemide  80 mg Intravenous BID  . insulin aspart  0-24 Units Subcutaneous TID AC & HS  . losartan  12.5 mg Oral Daily  . mouth rinse  15 mL Mouth Rinse BID  . metoprolol succinate  12.5 mg Oral Daily  . pantoprazole  40 mg Oral Daily  . sodium chloride flush  10-40 mL Intracatheter Q12H  . sodium chloride flush  3 mL Intravenous Q12H  . sodium chloride flush  3 mL Intravenous Q12H  . spironolactone  12.5 mg Oral Daily   Continuous Infusions: . sodium chloride Stopped (02/02/20 1300)  . amiodarone 30 mg/hr (02/03/20 0610)   PRN Meds:.sodium chloride, metoprolol tartrate, midazolam, morphine injection, ondansetron (ZOFRAN) IV, oxyCODONE, sodium chloride flush, sodium chloride flush, sodium chloride flush, traMADol  Xrays DG Chest [facility]  1 View  Result Date: 02/02/2020 CLINICAL DATA:  Right-sided PICC line placement. EXAM: PORTABLE CHEST 1 VIEW COMPARISON:  Feb 01, 2020. FINDINGS: Stable cardiomegaly. Sternotomy wires are noted. No pneumothorax is noted. Left lung is clear. Minimal right pleural effusion is noted. Interval placement of right-sided PICC line with distal tip in expected position of cavoatrial junction. Bony thorax is unremarkable. IMPRESSION: Interval placement of right-sided PICC line with distal tip in expected position of cavoatrial junction. Minimal right pleural effusion is noted. Electronically Signed   By: Lupita Raider M.D.   On: 02/02/2020 11:32   [place] EKG SITE RITE  Result Date: 02/02/2020 If Site Rite image not attached, placement could not be confirmed due to current cardiac  rhythm.  [place] EKG SITE RITE  Result Date: 02/02/2020 If Site Rite image not attached, placement could not be confirmed due to current cardiac rhythm.   Assessment/Plan: S/P Procedure(s) (LRB): OFF PUMP CORONARY ARTERY BYPASS GRAFTING (CABG) TIMES ONE (N/A) TRANSESOPHAGEAL ECHOCARDIOGRAM (TEE) (N/A)  1 doing well POD#4 2 back afib/ some sinus tachy - conts IV amio, will start eliquis per Dr Lucilla Lame note yesterday 3 sats good on RA 4 replace K+ to get>4.0 5 H/H stable mild ABL anemia 6 no leukocytosis or fevers 7 normal renal fxn, good UOP 8 AHF team managing CHF meds   LOS: 11 days    Corey Clack PA-C Pager [phone] 02/03/2020  patient stable but in aflutter 130's now on iv Cordarone Further afib/flutter management per cardiology Staring eliquis- no pacing wires in  I have seen and examined Corey Brady and agree with the above assessment  and plan.  Delight Ovens MD Beeper [phone] Office [phone] 02/03/2020 12:15 PM

## 2020-02-03 NOTE — Progress Notes (Signed)
  CARDIAC REHAB PHASE I   PRE:  Rate/Rhythm: 107 ST  BP:  Supine:   Sitting: 105/74  Standing:    SaO2: 99 RA  MODE:  Ambulation: 440 ft   POST:  Rate/Rhythm: 131 ST  BP:  Supine:   Sitting: 108/65  Standing:    SaO2: 999 RA 1500-1535  Assisted X 1 and used walker to ambulate. Gait steady with walker. Pt walked 440 feet without c/o. HR at end of walk 131 ST. Pt back to recliner after walk with call light in reach.  Melina Copa RN 02/03/2020 3:25 PM

## 2020-02-03 NOTE — Progress Notes (Signed)
Patient's HR was high 110-130's with his activities, it takes more than one hour for down to 100's. CVP was 3 in the morning. Talked to Dr. Tyrone Sage regarding this matter. Keep in the amiodarone drip may increase dose. However, there was no order it. Talked PA [place] regarding this matter and changed from metoprolol 12.5 XL to 25 mg BID. HR was staying 90's -100's now. HS [employer]

## 2020-02-04 LAB — COOXEMETRY PANEL
Carboxyhemoglobin: 1.4 % (ref 0.5–1.5)
Methemoglobin: 0.9 % (ref 0.0–1.5)
O2 Saturation: 61.4 %
Total hemoglobin: 13.4 g/dL (ref 12.0–16.0)

## 2020-02-04 LAB — GLUCOSE, CAPILLARY
Glucose-Capillary: 105 mg/dL — ABNORMAL HIGH (ref 70–99)
Glucose-Capillary: 151 mg/dL — ABNORMAL HIGH (ref 70–99)
Glucose-Capillary: 98 mg/dL (ref 70–99)
Glucose-Capillary: 123 mg/dL — ABNORMAL HIGH (ref 70–99)

## 2020-02-04 MED ORDER — DIGOXIN 125 MCG PO TABS
0.1250 mg | ORAL_TABLET | Freq: Every day | ORAL | Status: DC
  Administered 2020-02-04 – 2020-02-08 (×5): 0.125 mg via ORAL
  Filled 2020-02-04 (×5): qty 1

## 2020-02-04 NOTE — Discharge Instructions (Addendum)
[facility] office number [phone]   Coronary Artery Bypass Grafting, Care After This sheet gives you information about how to care for yourself after your procedure. Your doctor may also give you more specific instructions. If you have problems or questions, call your doctor. What can I expect after the procedure? After the procedure, it is common to:  Feel sick to your stomach (nauseous).  Not want to eat as much as normal (lack of appetite).  Have trouble pooping (constipation).  Have weakness and tiredness (fatigue).  Feel sad (depressed) or grouchy (irritable).  Have pain or discomfort around the cuts from surgery (incisions). Follow these instructions at home: Medicines  Take over-the-counter and prescription medicines only as told by your doctor. Do not stop taking medicines or start any new medicines unless your doctor says it is okay.  If you were prescribed an antibiotic medicine, take it as told by your doctor. Do not stop taking the antibiotic even if you start to feel better. Incision care   Follow instructions from your doctor about how to take care of your cuts from surgery. Make sure you: ? Wash your hands with soap and water before and after you change your bandage (dressing). If you cannot use soap and water, use hand sanitizer. ? Change your bandage as told by your doctor. ? Leave stitches (sutures), skin glue, or skin tape (adhesive) strips in place. They may need to stay in place for 2 weeks or longer. If tape strips get loose and curl up, you may trim the loose edges. Do not remove tape strips completely unless your doctor says it is okay.  Make sure the surgery cuts are clean, dry, and protected.  Check your cut areas every day for signs of infection. Check for: ? More redness, swelling, or pain. ? More fluid or blood. ? Warmth. ? Pus or a bad smell.  If cuts were made in your legs: ? Avoid crossing your legs. ? Avoid sitting for long periods of time.  Change positions every 30 minutes. ? Raise (elevate) your legs when you are sitting. Bathing  Do not take baths, swim, or use a hot tub until your doctor says it is okay.  You may shower. Pat the surgery cuts dry. Do not rub the cuts to dry.  Eating and drinking   Eat foods that are high in fiber, such as beans, nuts, whole grains, and raw fruits and vegetables. Any meats you eat should be lean cut. Avoid canned, processed, and fried foods. This can help prevent trouble pooping. This is also a part of a heart-healthy diet.  Drink enough fluid to keep your pee (urine) pale yellow.  Do not drink alcohol until you are fully recovered. Ask your doctor when it is safe to drink alcohol. Activity  Rest and limit your activity as told by your doctor. You may be told to: ? Stop any activity right away if you have chest pain, shortness of breath, irregular heartbeats, or dizziness. Get help right away if you have any of these symptoms. ? Move around often for short periods or take short walks as told by your doctor. Slowly increase your activities. ? Avoid lifting, pushing, or pulling anything that is heavier than 10 lb (4.5 kg) for at least 6 weeks or as told by your doctor.  Do physical therapy or a cardiac rehab (cardiac rehabilitation) program as told by your doctor. ? Physical therapy involves doing exercises to maintain movement and build strength and endurance. ?  A cardiac rehab program includes:  Exercise training.  Education.  Counseling.  Do not drive until your doctor says it is okay.  Ask your doctor when you can go back to work.  Ask your doctor when you can be sexually active. General instructions  Do not drive or use heavy machinery while taking prescription pain medicine.  Do not use any products that contain nicotine or tobacco. These include cigarettes, e-cigarettes, and chewing tobacco. If you need help quitting, ask your doctor.  Take 2-3 deep breaths every few  hours during the day while you get better. This helps expand your lungs and prevent problems.  If you were given a device called an incentive spirometer, use it several times a day to practice deep breathing. Support your chest with a pillow or your arms when you take deep breaths or cough.  Wear compression stockings as told by your doctor.  Weigh yourself every day. This helps to see if your body is holding (retaining) fluid that may make your heart and lungs work harder.  Keep all follow-up visits as told by your doctor. This is important. Contact a doctor if:  You have more redness, swelling, or pain around any cut.  You have more fluid or blood coming from any cut.  Any cut feels warm to the touch.  You have pus or a bad smell coming from any cut.  You have a fever.  You have swelling in your ankles or legs.  You have pain in your legs.  You gain 2 lb (0.9 kg) or more a day.  You feel sick to your stomach or you throw up (vomit).  You have watery poop (diarrhea). Get help right away if:  You have chest pain that goes to your jaw or arms.  You are short of breath.  You have a fast or irregular heartbeat.  You notice a "clicking" in your breastbone (sternum) when you move.  You have any signs of a stroke. "BE FAST" is an easy way to remember the main warning signs: ? B - Balance. Signs are dizziness, sudden trouble walking, or loss of balance. ? E - Eyes. Signs are trouble seeing or a change in how you see. ? F - Face. Signs are sudden weakness or loss of feeling of the face, or the face or eyelid drooping on one side. ? A - Arms. Signs are weakness or loss of feeling in an arm. This happens suddenly and usually on one side of the body. ? S - Speech. Signs are sudden trouble speaking, slurred speech, or trouble understanding what people say. ? T - Time. Time to call emergency services. Write down what time symptoms started.  You have other signs of a stroke, such  as: ? A sudden, very bad headache with no known cause. ? Feeling sick to your stomach. ? Throwing up. ? Jerky movements you cannot control (seizure). These symptoms may be an emergency. Do not wait to see if the symptoms will go away. Get medical help right away. Call your local emergency services (911 in the [place] Do not drive yourself to the hospital. Summary  After the procedure, it is common to have pain or discomfort in the cuts from surgery (incisions).  Do not take baths, swim, or use a hot tub until your doctor says it is okay.  Slowly increase your activities. You may need physical therapy or cardiac rehab.  Weigh yourself every day. This helps to see if your body is  holding fluid. This information is not intended to replace advice given to you by your health care provider. Make sure you discuss any questions you have with your health care provider. Document Revised: 05/24/2018 Document Reviewed: 05/24/2018 Elsevier Patient Education  2020 [employer].  Information on my medicine - ELIQUIS (apixaban)  Why was Eliquis prescribed for you? Eliquis was prescribed for you to reduce the risk of a blood clot forming that can cause a stroke if you have a medical condition called atrial fibrillation (a type of irregular heartbeat).  What do You need to know about Eliquis ? Take your Eliquis TWICE DAILY - one tablet in the morning and one tablet in the evening with or without food. If you have difficulty swallowing the tablet whole please discuss with your pharmacist how to take the medication safely.  Take Eliquis exactly as prescribed by your doctor and DO NOT stop taking Eliquis without talking to the doctor who prescribed the medication.  Stopping may increase your risk of developing a stroke.  Refill your prescription before you run out.  After discharge, you should have regular check-up appointments with your healthcare provider that is prescribing your Eliquis.  In the  future your dose may need to be changed if your kidney function or weight changes by a significant amount or as you get older.  What do you do if you miss a dose? If you miss a dose, take it as soon as you remember on the same day and resume taking twice daily.  Do not take more than one dose of ELIQUIS at the same time to make up a missed dose.  Important Safety Information A possible side effect of Eliquis is bleeding. You should call your healthcare provider right away if you experience any of the following: ? Bleeding from an injury or your nose that does not stop. ? Unusual colored urine (red or dark brown) or unusual colored stools (red or black). ? Unusual bruising for unknown reasons. ? A serious fall or if you hit your head (even if there is no bleeding).  Some medicines may interact with Eliquis and might increase your risk of bleeding or clotting while on Eliquis. To help avoid this, consult your healthcare provider or pharmacist prior to using any new prescription or non-prescription medications, including herbals, vitamins, non-steroidal anti-inflammatory drugs (NSAIDs) and supplements.  This website has more information on Eliquis (apixaban): http://www.eliquis.com/eliquis/home

## 2020-02-04 NOTE — Progress Notes (Signed)
Advanced Heart Failure Rounding Note  PCP-Cardiologist: No primary care provider on file.   Subjective:    S/p Off pump CABG x 1 w/  LIMA-LAD 5/4.   Milrinone stopped 5/7. Co-ox 49% last night. Will repeat thjs am.  Went into AFL on 5/8. On IV amio. Rate slower 90-100 but still in AFL.   CVP remains low. Off diuretics. Renal function stable.   Denies SOB,orthopnea or PND.    Objective:   Weight Range: 100.2 kg Body mass index is 29.96 kg/m.   Vital Signs:   Temp:  [97.6 F (36.4 C)-97.9 F (36.6 C)] 97.6 F (36.4 C) (05/09 0746) Pulse Rate:  [51-117] 96 (05/09 0922) Resp:  [16-23] 22 (05/09 0746) BP: (90-119)/(69-83) 99/76 (05/09 0746) SpO2:  [97 %-100 %] 100 % (05/09 0746) Weight:  [100.2 kg] 100.2 kg (05/09 0500) Last BM Date: 02/03/20  Weight change: Filed Weights   02/02/20 0500 02/03/20 0500 02/04/20 0500  Weight: 101.8 kg 100.3 kg 100.2 kg    Intake/Output:   Intake/Output Summary (Last 24 hours) at 02/04/2020 1000 Last data filed at 02/04/2020 0800 Gross per 24 hour  Intake 1262.59 ml  Output 350 ml  Net 912.59 ml      Physical Exam   General:  Sitting up in bed No resp difficulty HEENT: normal Neck: supple. no JVD. Carotids 2+ bilat; no bruits. No lymphadenopathy or thryomegaly appreciated. Cor: PMI nondisplaced. Irregular rate & rhythm. No rubs, gallops or murmurs. Lungs: clear Abdomen: soft, nontender, nondistended. No hepatosplenomegaly. No bruits or masses. Good bowel sounds. Extremities: no cyanosis, clubbing, rash,  + UNNA. Chronic venous stasis changes Neuro: alert & orientedx3, cranial nerves grossly intact. moves all 4 extremities w/o difficulty. Affect pleasant   Telemetry    AFL 90-100 Personally reviewed   Labs    CBC Recent Labs    02/02/20 0418 02/03/20 0429  WBC 8.4 7.6  HGB 12.1* 12.6*  HCT 36.4* 38.3*  MCV 89.2 89.9  PLT 213 272   Basic Metabolic Panel Recent Labs    02/02/20 0418 02/03/20 0429  NA 136  137  K 4.1 3.6  CL 99 99  CO2 27 29  GLUCOSE 91 113*  BUN 15 15  CREATININE 0.74 0.77  CALCIUM 8.6* 8.6*   Liver Function Tests No results for input(s): AST, ALT, ALKPHOS, BILITOT, PROT, ALBUMIN in the last 72 hours. No results for input(s): LIPASE, AMYLASE in the last 72 hours. Cardiac Enzymes No results for input(s): CKTOTAL, CKMB, CKMBINDEX, TROPONINI in the last 72 hours.  BNP: BNP (last 3 results) Recent Labs    01/23/20 0338  BNP 1,370.5*    ProBNP (last 3 results) No results for input(s): PROBNP in the last 8760 hours.   D-Dimer No results for input(s): DDIMER in the last 72 hours. Hemoglobin A1C No results for input(s): HGBA1C in the last 72 hours. Fasting Lipid Panel No results for input(s): CHOL, HDL, LDLCALC, TRIG, CHOLHDL, LDLDIRECT in the last 72 hours. Thyroid Function Tests No results for input(s): TSH, T4TOTAL, T3FREE, THYROIDAB in the last 72 hours.  Invalid input(s): FREET3  Other results:   Imaging    No results found.   Medications:     Scheduled Medications: . acetaminophen  1,000 mg Oral Q6H   Or  . acetaminophen (TYLENOL) oral liquid 160 mg/5 mL  1,000 mg Per Tube Q6H  . apixaban  5 mg Oral BID  . aspirin  81 mg Oral Daily  . atorvastatin  40  mg Oral Daily  . bisacodyl  10 mg Oral Daily   Or  . bisacodyl  10 mg Rectal Daily  . Chlorhexidine Gluconate Cloth  6 each Topical q morning - 10a  . docusate sodium  200 mg Oral Daily  . insulin aspart  0-24 Units Subcutaneous TID AC & HS  . losartan  12.5 mg Oral Daily  . mouth rinse  15 mL Mouth Rinse BID  . metoprolol tartrate  25 mg Oral BID  . pantoprazole  40 mg Oral Daily  . sodium chloride flush  10-40 mL Intracatheter Q12H  . sodium chloride flush  3 mL Intravenous Q12H  . sodium chloride flush  3 mL Intravenous Q12H  . spironolactone  12.5 mg Oral Daily    Infusions: . sodium chloride Stopped (02/02/20 1300)  . amiodarone 30 mg/hr (02/04/20 0621)    PRN  Medications: sodium chloride, metoprolol tartrate, midazolam, morphine injection, ondansetron (ZOFRAN) IV, oxyCODONE, sodium chloride flush, sodium chloride flush, sodium chloride flush, traMADol     Assessment/Plan   1. Acute Systolic Heart Failure ECHO EF 15% with severe global HK, and moderate RV dysfunction.  Etiology possible ETOH versus HTN (denies severe ETOH use). TSH was ok. HIV NR  -4/28 LHC with severe 1v CAD. RHC with preserved cardiac output.  - S/p CABG x 1 w/ LIMA-LAD 5/4 - Milrinone stopped 5/7. Cox-ox 49% last night.  Will recheck this am  - Volume status low. Off lasix.  - Continue spiro 12.5, toprol 25 and losartan 12.5. BP too low to titrate. Will add digoxin - Renal function stable.   2. CAD:  - LHC with severe 1VCAD - S/p CABG x 1 w/ LIMA-LAD 5/4 - continue post operative management per CT surgery - No s/s angina.  - on ASA 81, statin, atorvastatin 40 mg, b-blocker  3. Cellulits , R and LLE legs: - Partial Thickness wound with palpable pedal pulses.  - Improved. - WOC has seen  4. A fib/flutter RVR - rate improving with IV amio. Will continue Eliquis for now. He is Jehovah's Witness so would not continue long-term as he will not accept blood products.  -continue PPI - may need to rebolus - If doesn't chemically convert may need DC-CV  5 Social:  - He has no insurance so we will need to provide 30 days of HF meds at discharge.  Discussed with HF Pharmacy and HF SW for med assist. Referred to HF Paramedicine.     Length of Stay: 12  Arvilla Meres, MD  02/04/2020, 10:00 AM  Advanced Heart Failure Team Pager [phone] (M-F; 7a - 4p)  Please contact [facility] for night-coverage after hours (4p -7a ) and weekends on [email or web address]

## 2020-02-04 NOTE — Plan of Care (Signed)
  Problem: Education: Goal: Knowledge of General Education information will improve Description: Including pain rating scale, medication(s)/side effects and non-pharmacologic comfort measures Outcome: Progressing   Problem: Health Behavior/Discharge Planning: Goal: Ability to manage health-related needs will improve Outcome: Progressing   Problem: Clinical Measurements: Goal: Ability to maintain clinical measurements within normal limits will improve Outcome: Progressing Goal: Will remain free from infection Outcome: Progressing Goal: Diagnostic test results will improve Outcome: Progressing Goal: Cardiovascular complication will be avoided Outcome: Progressing   Problem: Activity: Goal: Risk for activity intolerance will decrease Outcome: Progressing   Problem: Skin Integrity: Goal: Risk for impaired skin integrity will decrease Outcome: Progressing   Problem: Education: Goal: Ability to demonstrate management of disease process will improve Outcome: Progressing Goal: Ability to verbalize understanding of medication therapies will improve Outcome: Progressing Goal: Individualized Educational Video(s) Outcome: Progressing   Problem: Activity: Goal: Capacity to carry out activities will improve Outcome: Progressing   Problem: Cardiac: Goal: Ability to achieve and maintain adequate cardiopulmonary perfusion will improve Outcome: Progressing   Problem: Education: Goal: Will demonstrate proper wound care and an understanding of methods to prevent future damage Outcome: Progressing Goal: Knowledge of disease or condition will improve Outcome: Progressing Goal: Knowledge of the prescribed therapeutic regimen will improve Outcome: Progressing Goal: Individualized Educational Video(s) Outcome: Progressing   Problem: Activity: Goal: Risk for activity intolerance will decrease Outcome: Progressing   Problem: Cardiac: Goal: Will achieve and/or maintain hemodynamic  stability Outcome: Progressing   Problem: Clinical Measurements: Goal: Postoperative complications will be avoided or minimized Outcome: Progressing   Problem: Respiratory: Goal: Respiratory status will improve Outcome: Progressing   Problem: Skin Integrity: Goal: Wound healing without signs and symptoms of infection Outcome: Progressing Goal: Risk for impaired skin integrity will decrease Outcome: Progressing   Problem: Urinary Elimination: Goal: Ability to achieve and maintain adequate renal perfusion and functioning will improve Outcome: Progressing   

## 2020-02-04 NOTE — Progress Notes (Addendum)
[address]       [place] [address]             [phone]      5 Days Post-Op Procedure(s) (LRB): OFF PUMP CORONARY ARTERY BYPASS GRAFTING (CABG) TIMES ONE (N/A) TRANSESOPHAGEAL ECHOCARDIOGRAM (TEE) (N/A) Subjective: Feels ok, remains in afib with some RVR  Objective: Vital signs in last 24 hours: Temp:  [97.6 F (36.4 C)-97.9 F (36.6 C)] 97.6 F (36.4 C) (05/09 1141) Pulse Rate:  [52-107] 98 (05/09 1141) Cardiac Rhythm: Atrial flutter (05/09 1141) Resp:  [16-23] 22 (05/09 1141) BP: (90-119)/(71-83) 98/76 (05/09 1141) SpO2:  [98 %-100 %] 98 % (05/09 1141) Weight:  [100.2 kg] 100.2 kg (05/09 0500)  Hemodynamic parameters for last 24 hours:    Intake/Output from previous day: 05/08 0701 - 05/09 0700 In: 1628.7 [P.O.:1230; I.V.:398.7] Out: 1575 [Urine:1575] Intake/Output this shift: Total I/O In: 600 [P.O.:600] Out: 0   General appearance: alert, cooperative and no distress Heart: irregularly irregular rhythm Lungs: clear to auscultation bilaterally Abdomen: benign Extremities: wraps in place Wound: sternal incis healing well  Lab Results: Recent Labs    02/02/20 0418 02/03/20 0429  WBC 8.4 7.6  HGB 12.1* 12.6*  HCT 36.4* 38.3*  PLT 213 270   BMET:  Recent Labs    02/02/20 0418 02/03/20 0429  NA 136 137  K 4.1 3.6  CL 99 99  CO2 27 29  GLUCOSE 91 113*  BUN 15 15  CREATININE 0.74 0.77  CALCIUM 8.6* 8.6*    PT/INR: No results for input(s): LABPROT, INR in the last 72 hours. ABG    Component Value Date/Time   PHART 7.328 (L) 01/30/2020 1513   HCO3 25.1 01/30/2020 1513   TCO2 27 01/30/2020 1513   ACIDBASEDEF 2.0 01/30/2020 1513   O2SAT 61.4 02/04/2020 1037   CBG (last 3)  Recent Labs    02/03/20 1523 02/03/20 2108 02/04/20 0624  GLUCAP 132* 106* 105*    Meds Scheduled Meds: . acetaminophen  1,000 mg Oral Q6H   Or  . acetaminophen (TYLENOL) oral liquid 160 mg/5 mL  1,000 mg Per Tube Q6H  . apixaban  5 mg  Oral BID  . aspirin  81 mg Oral Daily  . atorvastatin  40 mg Oral Daily  . bisacodyl  10 mg Oral Daily   Or  . bisacodyl  10 mg Rectal Daily  . Chlorhexidine Gluconate Cloth  6 each Topical q morning - 10a  . digoxin  0.125 mg Oral Daily  . docusate sodium  200 mg Oral Daily  . insulin aspart  0-24 Units Subcutaneous TID AC & HS  . losartan  12.5 mg Oral Daily  . mouth rinse  15 mL Mouth Rinse BID  . metoprolol tartrate  25 mg Oral BID  . pantoprazole  40 mg Oral Daily  . sodium chloride flush  10-40 mL Intracatheter Q12H  . sodium chloride flush  3 mL Intravenous Q12H  . sodium chloride flush  3 mL Intravenous Q12H  . spironolactone  12.5 mg Oral Daily   Continuous Infusions: . sodium chloride Stopped (02/02/20 1300)  . amiodarone 30 mg/hr (02/04/20 0621)   PRN Meds:.sodium chloride, metoprolol tartrate, midazolam, morphine injection, ondansetron (ZOFRAN) IV, oxyCODONE, sodium chloride flush, sodium chloride flush, sodium chloride flush, traMADol  Xrays No results found.  Assessment/Plan: S/P Procedure(s) (LRB): OFF PUMP CORONARY ARTERY BYPASS GRAFTING (CABG) TIMES ONE (N/A) TRANSESOPHAGEAL ECHOCARDIOGRAM (TEE) (N/A)  1 conts to slowly  progress 2 afib- on amio gtt, rate control a little better- may need DCCV if not chemically cardioverted. On Apixaban but will need to watch closely, H/H have been good but is a Jehova's witness 3 AHF team managing meds- EF is 15 %, not volume overloaded currently Co-Ox 61 this am 4 BS adeq controlled HG A1C 6.3- cont dietary management but will need primary care management as outpatient    LOS: 12 days    John Giovanni PA-C Pager [phone] 02/04/2020  Feel ok, no complaints  remains on Cordarone drip - Atrial Flutter cardiology considering cardioversion  Started Apixaban  Yesterday  I have seen and examined Levin Bacon and agree with the above assessment  and plan.  Grace Isaac MD Beeper [phone] Office [phone] 02/04/2020  12:50 PM

## 2020-02-04 NOTE — Progress Notes (Signed)
HR was staying 90's -100's when patient was resting in the bed or chair, but HR went up to 120's while he ambulated on the floor as well as moving bed to chair or going to bathroom. Staying 110's -120's. Changed BLE dressing with kerlix, wound was dry and need to wound care consult again tomorrow. HS [employer]

## 2020-02-05 LAB — COOXEMETRY PANEL
Carboxyhemoglobin: 0.9 % (ref 0.5–1.5)
Methemoglobin: 0.5 % (ref 0.0–1.5)
O2 Saturation: 57.5 %
Total hemoglobin: 15.2 g/dL (ref 12.0–16.0)

## 2020-02-05 LAB — BASIC METABOLIC PANEL
Anion gap: 13 (ref 5–15)
BUN: 22 mg/dL (ref 8–23)
CO2: 27 mmol/L (ref 22–32)
Calcium: 8.8 mg/dL — ABNORMAL LOW (ref 8.9–10.3)
Chloride: 97 mmol/L — ABNORMAL LOW (ref 98–111)
Creatinine, Ser: 0.97 mg/dL (ref 0.61–1.24)
GFR calc Af Amer: >60 mL/min (ref >60)
GFR calc non Af Amer: >60 mL/min (ref >60)
Glucose, Bld: 184 mg/dL — ABNORMAL HIGH (ref 70–99)
Potassium: 4.1 mmol/L (ref 3.5–5.1)
Sodium: 137 mmol/L (ref 135–145)

## 2020-02-05 LAB — GLUCOSE, CAPILLARY
Glucose-Capillary: 104 mg/dL — ABNORMAL HIGH (ref 70–99)
Glucose-Capillary: 111 mg/dL — ABNORMAL HIGH (ref 70–99)
Glucose-Capillary: 117 mg/dL — ABNORMAL HIGH (ref 70–99)
Glucose-Capillary: 118 mg/dL — ABNORMAL HIGH (ref 70–99)

## 2020-02-05 LAB — CBC
HCT: 34.9 % — ABNORMAL LOW (ref 39.0–52.0)
Hemoglobin: 11.3 g/dL — ABNORMAL LOW (ref 13.0–17.0)
MCH: 29.7 pg (ref 26.0–34.0)
MCHC: 32.4 g/dL (ref 30.0–36.0)
MCV: 91.8 fL (ref 80.0–100.0)
Platelets: 276 10*3/uL (ref 150–400)
RBC: 3.8 MIL/uL — ABNORMAL LOW (ref 4.22–5.81)
RDW: 15 % (ref 11.5–15.5)
WBC: 7.2 10*3/uL (ref 4.0–10.5)
nRBC: 0 % (ref 0.0–0.2)

## 2020-02-05 MED ORDER — AMIODARONE IV BOLUS ONLY 150 MG/100ML: 150.0000 mg | Freq: Once | INTRAVENOUS | Status: DC

## 2020-02-05 MED ORDER — HYDROCERIN EX CREA
TOPICAL_CREAM | Freq: Two times a day (BID) | CUTANEOUS | Status: DC
  Administered 2020-02-06: 1 via TOPICAL
  Filled 2020-02-05: qty 113

## 2020-02-05 MED ORDER — AMIODARONE LOAD VIA INFUSION
150.0000 mg | Freq: Once | INTRAVENOUS | Status: AC
  Administered 2020-02-05: 150 mg via INTRAVENOUS
  Filled 2020-02-05: qty 83.34

## 2020-02-05 MED FILL — Lidocaine HCl Local Preservative Free (PF) Inj 2%: INTRAMUSCULAR | Qty: 15 | Status: AC

## 2020-02-05 MED FILL — Heparin Sodium (Porcine) Inj 1000 Unit/ML: INTRAMUSCULAR | Qty: 30 | Status: AC

## 2020-02-05 MED FILL — Potassium Chloride Inj 2 mEq/ML: INTRAVENOUS | Qty: 40 | Status: AC

## 2020-02-05 NOTE — Progress Notes (Addendum)
Advanced Heart Failure Rounding Note  PCP-Cardiologist: No primary care provider on file.   Subjective:    S/p Off pump CABG x 1 w/  LIMA-LAD 5/4.   Milrinone stopped 5/7. Co-ox marginal at 58%.   Went into AFL on 5/8. Remains in atrial flutter w/ RVR despite IV amiodarone, rates in the 110- 120s. Fairly asymptomatic. BP ok.   CVP 7.   No labs since 5/8. Will check BMP.   Feels ok. No complaints.    Objective:   Weight Range: 101.1 kg Body mass index is 30.23 kg/m.   Vital Signs:   Temp:  [97.6 F (36.4 C)-97.8 F (36.6 C)] 97.8 F (36.6 C) (05/10 0400) Pulse Rate:  [52-103] 100 (05/09 2258) Resp:  [16-22] 19 (05/10 0500) BP: (92-103)/(67-76) 102/68 (05/10 0400) SpO2:  [97 %-100 %] 100 % (05/09 2258) Weight:  [101.1 kg] 101.1 kg (05/10 0500) Last BM Date: 02/03/20  Weight change: Filed Weights   02/03/20 0500 02/04/20 0500 02/05/20 0500  Weight: 100.3 kg 100.2 kg 101.1 kg    Intake/Output:   Intake/Output Summary (Last 24 hours) at 02/05/2020 0728 Last data filed at 02/05/2020 0500 Gross per 24 hour  Intake 1436 ml  Output 605 ml  Net 831 ml      Physical Exam   PHYSICAL EXAM: CVP 7 General:  Well appearing WM sitting up in bed. No respiratory difficulty HEENT: normal Neck: supple. JVD ~7 cm. Carotids 2+ bilat; no bruits. No lymphadenopathy or thyromegaly appreciated. Cor: PMI nondisplaced. Irregular rhythm, tachy rate. No rubs, gallops or murmurs. Lungs: decreased BS at the bases bilaterally  Abdomen: soft, nontender, nondistended. No hepatosplenomegaly. No bruits or masses. Good bowel sounds. Extremities: no cyanosis, clubbing, rash, edema + bilateral unna boots  Neuro: alert & oriented x 3, cranial nerves grossly intact. moves all 4 extremities w/o difficulty. Affect pleasant.    Telemetry    AFL 110s- 120s Personally reviewed   Labs    CBC Recent Labs    02/03/20 0429  WBC 7.6  HGB 12.6*  HCT 38.3*  MCV 89.9  PLT 270    Basic Metabolic Panel Recent Labs    00/76/22 0429  NA 137  K 3.6  CL 99  CO2 29  GLUCOSE 113*  BUN 15  CREATININE 0.77  CALCIUM 8.6*   Liver Function Tests No results for input(s): AST, ALT, ALKPHOS, BILITOT, PROT, ALBUMIN in the last 72 hours. No results for input(s): LIPASE, AMYLASE in the last 72 hours. Cardiac Enzymes No results for input(s): CKTOTAL, CKMB, CKMBINDEX, TROPONINI in the last 72 hours.  BNP: BNP (last 3 results) Recent Labs    01/23/20 0338  BNP 1,370.5*    ProBNP (last 3 results) No results for input(s): PROBNP in the last 8760 hours.   D-Dimer No results for input(s): DDIMER in the last 72 hours. Hemoglobin A1C No results for input(s): HGBA1C in the last 72 hours. Fasting Lipid Panel No results for input(s): CHOL, HDL, LDLCALC, TRIG, CHOLHDL, LDLDIRECT in the last 72 hours. Thyroid Function Tests No results for input(s): TSH, T4TOTAL, T3FREE, THYROIDAB in the last 72 hours.  Invalid input(s): FREET3  Other results:   Imaging    No results found.   Medications:     Scheduled Medications: . apixaban  5 mg Oral BID  . aspirin  81 mg Oral Daily  . atorvastatin  40 mg Oral Daily  . bisacodyl  10 mg Oral Daily   Or  . bisacodyl  10 mg Rectal Daily  . Chlorhexidine Gluconate Cloth  6 each Topical q morning - 10a  . digoxin  0.125 mg Oral Daily  . docusate sodium  200 mg Oral Daily  . insulin aspart  0-24 Units Subcutaneous TID AC & HS  . losartan  12.5 mg Oral Daily  . mouth rinse  15 mL Mouth Rinse BID  . metoprolol tartrate  25 mg Oral BID  . pantoprazole  40 mg Oral Daily  . sodium chloride flush  10-40 mL Intracatheter Q12H  . sodium chloride flush  3 mL Intravenous Q12H  . sodium chloride flush  3 mL Intravenous Q12H  . spironolactone  12.5 mg Oral Daily    Infusions: . sodium chloride Stopped (02/02/20 1300)  . amiodarone 30 mg/hr (02/05/20 0500)    PRN Medications: sodium chloride, metoprolol tartrate,  midazolam, morphine injection, ondansetron (ZOFRAN) IV, oxyCODONE, sodium chloride flush, sodium chloride flush, sodium chloride flush, traMADol     Assessment/Plan   1. Acute Systolic Heart Failure ECHO EF 15% with severe global HK, and moderate RV dysfunction.  Etiology possible ETOH versus HTN (denies severe ETOH use). TSH was ok. HIV NR  -4/28 LHC with severe 1v CAD. RHC with preserved cardiac output.  - S/p CABG x 1 w/ LIMA-LAD 5/4 - Milrinone stopped 5/7. Co-ox marginal at 58% - Volume status ok. CVP 7. Off lasix.   - Continue spiro 12.5 and losartan 12.5. BP too low to titrate.  - with marginal co-ox, we will hold metoprolol for now - continue digoxin 0.125 mg - Check BMP   2. CAD:  - LHC with severe 1VCAD - S/p CABG x 1 w/ LIMA-LAD 5/4 - continue post operative management per CT surgery - No s/s angina.  - on ASA 81, statin, atorvastatin 40 mg, b-blocker  3. Cellulits , R and LLE legs: - Partial Thickness wound with palpable pedal pulses.  - Improved. - WOC has seen  4. A fib/flutter RVR - rates in the 120s. Will continue Eliquis for now. He is Jehov[employer]'s Witness so would not continue long-term as he will not accept blood products.  -continue PPI - will hold metoprolol for now given marginal co-ox - will rebolus amiodarone now - If he doesn't chemically convert may need DC-CV. Will tentatively schedule for tomorrow   5 Social: - He has no insurance so we will need to provide 30 days of HF meds at discharge.  Discussed with HF Pharmacy and HF SW for med assist. Referred to HF Paramedicine.     Length of Stay: [address] PA-C  02/05/2020, 7:28 AM  Advanced Heart Failure Team Pager [phone] (M-F; 7a - 4p)  Please contact [facility] for night-coverage after hours (4p -7a ) and weekends on [email or web address]   Patient seen and examined with the above-signed Advanced Practice Provider and/or Housestaff. I personally reviewed laboratory data, imaging  studies and relevant notes. I independently examined the patient and formulated the important aspects of the plan. I have edited the note to reflect any of my changes or salient points. I have personally discussed the plan with the patient and/or family.  Remains in AFL with RVR despite IV milrinone. Co-ox improved to 58% but still marginal. Volume status ok. Remains on Eliquis. No bleeding  General:  Well appearing. No resp difficulty HEENT: normal Neck: supple. no JVD. Carotids 2+ bilat; no bruits. No lymphadenopathy or thryomegaly appreciated. Cor: sternal wound ok  PMI nondisplaced. Irregular rate & rhythm. No rubs,  gallops or murmurs. Lungs: clear Abdomen: soft, nontender, nondistended. No hepatosplenomegaly. No bruits or masses. Good bowel sounds. Extremities: no cyanosis, clubbing, rash. Wrapped chronic venous stasis changes Neuro: alert & orientedx3, cranial nerves grossly intact. moves all 4 extremities w/o difficulty. Affect pleasant  Will plan TEE/DC-CV in am. Continue Eliquis. Will likely stop Eliquis in 3-4 weeks give Jehovah's Witness and risk of bleeding. HF status stable.   Glori Bickers, MD  9:20 AM

## 2020-02-05 NOTE — Progress Notes (Addendum)
[name]       [name], [id number]             [phone]      6 Days Post-Op Procedure(s) (LRB): OFF PUMP CORONARY ARTERY BYPASS GRAFTING (CABG) TIMES ONE (N/A) TRANSESOPHAGEAL ECHOCARDIOGRAM (TEE) (N/A) Subjective: conts with aflutter, RVR Feels pretty well, not SOB  Objective: Vital signs in last 24 hours: Temp:  [97.6 F (36.4 C)-97.8 F (36.6 C)] 97.8 F (36.6 C) (05/10 0400) Pulse Rate:  [52-103] 100 (05/09 2258) Cardiac Rhythm: Atrial fibrillation (05/10 0500) Resp:  [16-22] 19 (05/10 0500) BP: (92-103)/(67-76) 102/68 (05/10 0400) SpO2:  [97 %-100 %] 100 % (05/09 2258) Weight:  [101.1 kg] 101.1 kg (05/10 0500)  Hemodynamic parameters for last 24 hours:    Intake/Output from previous day: 05/09 0701 - 05/10 0700 In: 1436 [P.O.:1020; I.V.:416] Out: 605 [Urine:605] Intake/Output this shift: No intake/output data recorded.  General appearance: alert, cooperative and no distress Heart: irregularly irregular rhythm and tachy Lungs: clear BS Abdomen: benign Extremities: wraps in place Wound: incis healing well  Lab Results: Recent Labs    02/03/20 0429  WBC 7.6  HGB 12.6*  HCT 38.3*  PLT 270   BMET:  Recent Labs    02/03/20 0429  NA 137  K 3.6  CL 99  CO2 29  GLUCOSE 113*  BUN 15  CREATININE 0.77  CALCIUM 8.6*    PT/INR: No results for input(s): LABPROT, INR in the last 72 hours. ABG    Component Value Date/Time   PHART 7.328 (L) 01/30/2020 1513   HCO3 25.1 01/30/2020 1513   TCO2 27 01/30/2020 1513   ACIDBASEDEF 2.0 01/30/2020 1513   O2SAT 57.5 02/05/2020 0452   CBG (last 3)  Recent Labs    02/04/20 1644 02/04/20 2109 02/05/20 0725  GLUCAP 98 123* 104*    Meds Scheduled Meds: . apixaban  5 mg Oral BID  . aspirin  81 mg Oral Daily  . atorvastatin  40 mg Oral Daily  . bisacodyl  10 mg Oral Daily   Or  . bisacodyl  10 mg Rectal Daily  . Chlorhexidine Gluconate Cloth  6 each Topical q morning - 10a    . digoxin  0.125 mg Oral Daily  . docusate sodium  200 mg Oral Daily  . insulin aspart  0-24 Units Subcutaneous TID AC & HS  . losartan  12.5 mg Oral Daily  . mouth rinse  15 mL Mouth Rinse BID  . metoprolol tartrate  25 mg Oral BID  . pantoprazole  40 mg Oral Daily  . sodium chloride flush  10-40 mL Intracatheter Q12H  . sodium chloride flush  3 mL Intravenous Q12H  . sodium chloride flush  3 mL Intravenous Q12H  . spironolactone  12.5 mg Oral Daily   Continuous Infusions: . sodium chloride Stopped (02/02/20 1300)  . amiodarone 30 mg/hr (02/05/20 0727)   PRN Meds:.sodium chloride, metoprolol tartrate, midazolam, morphine injection, ondansetron (ZOFRAN) IV, oxyCODONE, sodium chloride flush, sodium chloride flush, sodium chloride flush, traMADol  Xrays No results found.  Assessment/Plan: S/P Procedure(s) (LRB): OFF PUMP CORONARY ARTERY BYPASS GRAFTING (CABG) TIMES ONE (N/A) TRANSESOPHAGEAL ECHOCARDIOGRAM (TEE) (N/A)  1 remains stable but aflutter persists,on beta blocker and amio gtt may need cardioversion. AHF team managing this and heart failure issues. Pacer wires are out. Co-OX is 57 2 sats good on RA 3 BS well lcontrolled 4 cont rehab/pulm toilet 5 will ask wound care nurse to  re check LE's  LOS: 13 days    Rowe Clack PA-C Pager [phone] 02/05/2020   Doing well On amio gtt Plan for cardioversion tomorrow.  Corey Brady

## 2020-02-05 NOTE — Progress Notes (Signed)
CARDIAC REHAB PHASE I   PRE:  Rate/Rhythm: 95 SR  BP:  Sitting: 104/72      SaO2: 99 RA  MODE:  Ambulation: 400 ft   POST:  Rate/Rhythm: 126 ST  BP:  Sitting: 114/78    SaO2: 98 RA  PT ambulated 423ft in hallway standby assist with front wheel walker. Pt denies pain, SOB, or dizziness. Pt returned to recliner, lunch set up. Encouraged continued ambulation and IS use. Will continue to follow.  [phone] Reynold Bowen, RN BSN 02/05/2020 11:42 AM

## 2020-02-05 NOTE — Consult Note (Signed)
WOC Nurse wound follow up Re-evaluate cellulitis.  Superficial lesions has resolved.  Left posterior lower leg with 1 cm round nonintact partial thickness wound.  Wound type: edema resolved.  Wounds are healing.  Measurement: 0.5  cm round nonintact wound to left lower leg. Resolving wounds to right lower leg Wound IZT:IWPY and moist Drainage (amount, consistency, odor) none noted Periwound:edema resolved   Dressing procedure/placement/frequency: Cleanse lower legs with soap and water and pat dry.  Apply Eucerin to lower legs.  Wrap with kerlix from BELOW TOES to below KNEE.  Secure with ace wrap. Change Mon/Wed/Fri.  Will not follow at this time.  Please re-consult if needed.  Maple Hudson MSN, RN, FNP-BC CWON Wound, Ostomy, Continence Nurse Pager [phone]

## 2020-02-06 ENCOUNTER — Inpatient Hospital Stay (HOSPITAL_COMMUNITY): Payer: Self-pay | Admitting: Registered Nurse

## 2020-02-06 ENCOUNTER — Encounter (HOSPITAL_COMMUNITY)
Admission: EM | Disposition: A | Discharge status: Home Health | Payer: Self-pay | Source: Home / Self Care | Attending: Thoracic Surgery (Cardiothoracic Vascular Surgery)

## 2020-02-06 ENCOUNTER — Encounter (HOSPITAL_COMMUNITY): Payer: Self-pay | Admitting: Thoracic Surgery (Cardiothoracic Vascular Surgery)

## 2020-02-06 ENCOUNTER — Inpatient Hospital Stay (HOSPITAL_COMMUNITY): Payer: Self-pay

## 2020-02-06 DIAGNOSIS — I340 Nonrheumatic mitral (valve) insufficiency: Secondary | ICD-10-CM

## 2020-02-06 DIAGNOSIS — I480 Paroxysmal atrial fibrillation: Secondary | ICD-10-CM

## 2020-02-06 DIAGNOSIS — I4892 Unspecified atrial flutter: Secondary | ICD-10-CM

## 2020-02-06 DIAGNOSIS — I361 Nonrheumatic tricuspid (valve) insufficiency: Secondary | ICD-10-CM

## 2020-02-06 HISTORY — PX: TEE WITHOUT CARDIOVERSION: SHX5443

## 2020-02-06 HISTORY — PX: CARDIOVERSION: SHX1299

## 2020-02-06 LAB — COOXEMETRY PANEL
Carboxyhemoglobin: 0.9 % (ref 0.5–1.5)
Methemoglobin: 0.3 % (ref 0.0–1.5)
O2 Saturation: 58.1 %
Total hemoglobin: 12 g/dL (ref 12.0–16.0)

## 2020-02-06 LAB — GLUCOSE, CAPILLARY
Glucose-Capillary: 98 mg/dL (ref 70–99)
Glucose-Capillary: 96 mg/dL (ref 70–99)
Glucose-Capillary: 121 mg/dL — ABNORMAL HIGH (ref 70–99)
Glucose-Capillary: 94 mg/dL (ref 70–99)

## 2020-02-06 SURGERY — ECHOCARDIOGRAM, TRANSESOPHAGEAL
Anesthesia: General

## 2020-02-06 MED ORDER — BUTAMBEN-TETRACAINE-BENZOCAINE 2-2-14 % EX AERO
INHALATION_SPRAY | CUTANEOUS | Status: DC | PRN
  Administered 2020-02-06: 08:00:00 2 via TOPICAL

## 2020-02-06 MED ORDER — LIDOCAINE 2% (20 MG/ML) 5 ML SYRINGE
INTRAMUSCULAR | Status: DC | PRN
  Administered 2020-02-06: 60 mg via INTRAVENOUS
  Administered 2020-02-06: 40 mg via INTRAVENOUS

## 2020-02-06 MED ORDER — SODIUM CHLORIDE 0.9 % IV SOLN
INTRAVENOUS | Status: AC | PRN
  Administered 2020-02-06: 500 mL via INTRAVENOUS

## 2020-02-06 MED ORDER — PROPOFOL 500 MG/50ML IV EMUL
INTRAVENOUS | Status: DC | PRN
  Administered 2020-02-06: 75 ug/kg/min via INTRAVENOUS

## 2020-02-06 MED ORDER — PROPOFOL 10 MG/ML IV BOLUS
INTRAVENOUS | Status: DC | PRN
  Administered 2020-02-06: 20 mg via INTRAVENOUS

## 2020-02-06 NOTE — Plan of Care (Signed)
  Problem: Education: Goal: Knowledge of General Education information will improve Description: Including pain rating scale, medication(s)/side effects and non-pharmacologic comfort measures Outcome: Progressing   Problem: Health Behavior/Discharge Planning: Goal: Ability to manage health-related needs will improve Outcome: Progressing   Problem: Clinical Measurements: Goal: Will remain free from infection Outcome: Progressing   Problem: Activity: Goal: Risk for activity intolerance will decrease Outcome: Progressing   Problem: Activity: Goal: Capacity to carry out activities will improve Outcome: Progressing   Problem: Activity: Goal: Risk for activity intolerance will decrease Outcome: Progressing   Problem: Respiratory: Goal: Respiratory status will improve Outcome: Progressing

## 2020-02-06 NOTE — Anesthesia Preprocedure Evaluation (Addendum)
Anesthesia Evaluation  Patient identified by MRN, date of birth, ID band Patient awake    Reviewed: Allergy & Precautions, NPO status , Patient's Chart, lab work & pertinent test results  Airway Mallampati: I  TM Distance: >3 FB Neck ROM: Full    Dental  (+) Teeth Intact, Dental Advisory Given   Pulmonary neg pulmonary ROS,    breath sounds clear to auscultation       Cardiovascular negative cardio ROS   Rhythm:Irregular Rate:Normal     Neuro/Psych negative neurological ROS  negative psych ROS   GI/Hepatic negative GI ROS, Neg liver ROS,   Endo/Other  negative endocrine ROS  Renal/GU negative Renal ROS     Musculoskeletal negative musculoskeletal ROS (+)   Abdominal Normal abdominal exam  (+)   Peds  Hematology negative hematology ROS (+)   Anesthesia Other Findings   Reproductive/Obstetrics                            Anesthesia Physical Anesthesia Plan  ASA: III  Anesthesia Plan: General   Post-op Pain Management:    Induction: Intravenous  PONV Risk Score and Plan: 0 and Propofol infusion  Airway Management Planned: Natural Airway and Nasal Cannula  Additional Equipment: None  Intra-op Plan:   Post-operative Plan:   Informed Consent: I have reviewed the patients History and Physical, chart, labs and discussed the procedure including the risks, benefits and alternatives for the proposed anesthesia with the patient or authorized representative who has indicated his/her understanding and acceptance.       Plan Discussed with: CRNA  Anesthesia Plan Comments:        Anesthesia Quick Evaluation

## 2020-02-06 NOTE — Progress Notes (Signed)
CARDIAC REHAB PHASE I   PRE:  Rate/Rhythm: 76 SR  BP:  Sitting: 93/78      SaO2: 97 RA  MODE:  Ambulation: 400 ft   POST:  Rate/Rhythm: 91 SR  BP:  Sitting: 88/76 --> 114/77    SaO2: 95 RA  Pt ambulated 42ft in hallway standby assist with front wheel walker. Pt denied pain, SOB, or dizziness throughout the walk. D/c c/o leg weakness. Pt returned to bed, BP note, pt continued to deny dizziness, states "doesn't feel himself". BP retaken. Call bell and bedside table within reach. Encouraged pt sit up for dinner and walk again before bed. Will continue to follow and encourage ambulation.  [phone] Reynold Bowen, RN BSN 02/06/2020 2:16 PM

## 2020-02-06 NOTE — Interval H&P Note (Signed)
History and Physical Interval Note:  02/06/2020 8:21 AM  Corey Brady  has presented today for surgery, with the diagnosis of atrial flutter.  The various methods of treatment have been discussed with the patient and family. After consideration of risks, benefits and other options for treatment, the patient has consented to  Procedure(s): TRANSESOPHAGEAL ECHOCARDIOGRAM (TEE) (N/A) CARDIOVERSION (N/A) as a surgical intervention.  The patient's history has been reviewed, patient examined, no change in status, stable for surgery.  I have reviewed the patient's chart and labs.  Questions were answered to the patient's satisfaction.     [name]

## 2020-02-06 NOTE — Plan of Care (Signed)
  Problem: Education: Goal: Knowledge of General Education information will improve Description: Including pain rating scale, medication(s)/side effects and non-pharmacologic comfort measures Outcome: Progressing   Problem: Health Behavior/Discharge Planning: Goal: Ability to manage health-related needs will improve Outcome: Progressing   Problem: Clinical Measurements: Goal: Ability to maintain clinical measurements within normal limits will improve Outcome: Progressing Goal: Will remain free from infection Outcome: Progressing Goal: Diagnostic test results will improve Outcome: Progressing Goal: Cardiovascular complication will be avoided Outcome: Progressing   Problem: Activity: Goal: Risk for activity intolerance will decrease Outcome: Progressing   Problem: Skin Integrity: Goal: Risk for impaired skin integrity will decrease Outcome: Progressing   Problem: Education: Goal: Ability to demonstrate management of disease process will improve Outcome: Progressing Goal: Ability to verbalize understanding of medication therapies will improve Outcome: Progressing Goal: Individualized Educational Video(s) Outcome: Progressing   Problem: Respiratory: Goal: Respiratory status will improve Outcome: Progressing   Problem: Skin Integrity: Goal: Wound healing without signs and symptoms of infection Outcome: Progressing Goal: Risk for impaired skin integrity will decrease Outcome: Progressing   Problem: Urinary Elimination: Goal: Ability to achieve and maintain adequate renal perfusion and functioning will improve Outcome: Progressing

## 2020-02-06 NOTE — Progress Notes (Signed)
  Echocardiogram Echocardiogram Transesophageal has been performed.  Corey Brady A Corey Brady 02/06/2020, 9:01 AM
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
## 2020-02-06 NOTE — Anesthesia Postprocedure Evaluation (Signed)
Anesthesia Post Note  Patient: Corey Brady  Procedure(s) Performed: TRANSESOPHAGEAL ECHOCARDIOGRAM (TEE) (N/A ) CARDIOVERSION (N/A )     Patient location during evaluation: PACU Anesthesia Type: General Level of consciousness: awake and alert Pain management: pain level controlled Vital Signs Assessment: post-procedure vital signs reviewed and stable Respiratory status: spontaneous breathing, nonlabored ventilation, respiratory function stable and patient connected to nasal cannula oxygen Cardiovascular status: stable and blood pressure returned to baseline Postop Assessment: no apparent nausea or vomiting Anesthetic complications: no    Last Vitals:  Vitals:   02/06/20 0845 02/06/20 0859  BP: 99/69 (!) 100/57  Pulse:  78  Resp: (!) 23 18  Temp: 37.1 C   SpO2: 98% 98%    Last Pain:  Vitals:   02/06/20 0859  TempSrc:   PainSc: 0-No pain                 Shelton Silvas

## 2020-02-06 NOTE — Progress Notes (Signed)
     [address]       [place] [address]             [phone]       Cardioversion earlier today.  Now back in sinus. Doing well  Vitals:   02/06/20 1023 02/06/20 1025  BP:  104/77  Pulse: 74 76  Resp:  18  Temp:  98 F (36.7 C)  SpO2:  99%   Alert NAD Sinus EWOB Incision clean  Labs reviewed On amio gtt  64 yo male s/p CABG 1.  CHF, aflutter s/p cardioversion Doing well Amio gtt per heart failure Continue pain control, and pulm toilet  Antonia Culbertson O Kambry Takacs

## 2020-02-06 NOTE — Transfer of Care (Signed)
Immediate Anesthesia Transfer of Care Note  Patient: Corey Brady  Procedure(s) Performed: TRANSESOPHAGEAL ECHOCARDIOGRAM (TEE) (N/A ) CARDIOVERSION (N/A )  Patient Location: PACU and Endoscopy Unit  Anesthesia Type:MAC and General  Level of Consciousness: drowsy  Airway & Oxygen Therapy: Patient Spontanous Breathing and Patient connected to nasal cannula oxygen  Post-op Assessment: Report given to RN and Post -op Vital signs reviewed and stable  Post vital signs: Reviewed and stable  Last Vitals:  Vitals Value Taken Time  BP    Temp    Pulse    Resp    SpO2      Last Pain:  Vitals:   02/06/20 0800  TempSrc: Oral  PainSc: 0-No pain      Patients Stated Pain Goal: 2 (14/48/18 5631)  Complications: No apparent anesthesia complications

## 2020-02-06 NOTE — Anesthesia Procedure Notes (Signed)
Procedure Name: MAC Date/Time: 02/06/2020 8:15 AM Performed by: Trinna Post., CRNA Pre-anesthesia Checklist: Patient identified, Emergency Drugs available, Suction available, Patient being monitored and Timeout performed Patient Re-evaluated:Patient Re-evaluated prior to induction Oxygen Delivery Method: Nasal cannula Preoxygenation: Pre-oxygenation with 100% oxygen Induction Type: IV induction Placement Confirmation: positive ETCO2

## 2020-02-06 NOTE — Progress Notes (Signed)
  Advanced Heart Failure Rounding Note  PCP-Cardiologist: No primary care provider on file.   Subjective:    S/p Off pump CABG x 1 w/  LIMA-LAD 5/4.   Milrinone stopped 5/7. Co-ox marginal but stable at 58% this am .   Went into AFL on 5/8. Remains in atrial flutter. Rate improved on IV amiodarone. On Eliquis with no obvious bleeding but hgb drifting down slowly. .   Feels weak. No SOB,orthopnea or PND.    Objective:   Weight Range: 101.4 kg Body mass index is 30.32 kg/m.   Vital Signs:   Temp:  [97.4 F (36.3 C)-98.2 F (36.8 C)] 98.2 F (36.8 C) (05/11 0800) Pulse Rate:  [85-98] 87 (05/11 0800) Resp:  [17-23] 20 (05/11 0800) BP: (98-112)/(61-77) 98/64 (05/11 0800) SpO2:  [95 %-100 %] 99 % (05/11 0800) Weight:  [101.4 kg] 101.4 kg (05/11 0745) Last BM Date: 02/03/20  Weight change: Filed Weights   02/05/20 0500 02/06/20 0500 02/06/20 0745  Weight: 101.1 kg 101.4 kg 101.4 kg    Intake/Output:   Intake/Output Summary (Last 24 hours) at 02/06/2020 0809 Last data filed at 02/06/2020 0340 Gross per 24 hour  Intake 447.07 ml  Output 1100 ml  Net -652.93 ml      Physical Exam   General:  Weak appearing. No resp difficulty HEENT: normal Neck: supple. no JVD. Carotids 2+ bilat; no bruits. No lymphadenopathy or thryomegaly appreciated. Cor: PMI nondisplaced. Irregular rate & rhythm. No rubs, gallops or murmurs. Lungs: clear Abdomen: soft, nontender, nondistended. No hepatosplenomegaly. No bruits or masses. Good bowel sounds. Extremities: no cyanosis, clubbing, rash, legs wrapped. Chronic venous stasis schanges Neuro: alert & orientedx3, cranial nerves grossly intact. moves all 4 extremities w/o difficulty. Affect pleasant    Telemetry    AFL 80-90s Personally reviewed   Labs    CBC Recent Labs    02/05/20 1120  WBC 7.2  HGB 11.3*  HCT 34.9*  MCV 91.8  PLT 276   Basic Metabolic Panel Recent Labs    02/05/20 1308  NA 137  K 4.1  CL 97*   CO2 27  GLUCOSE 184*  BUN 22  CREATININE 0.97  CALCIUM 8.8*   Liver Function Tests No results for input(s): AST, ALT, ALKPHOS, BILITOT, PROT, ALBUMIN in the last 72 hours. No results for input(s): LIPASE, AMYLASE in the last 72 hours. Cardiac Enzymes No results for input(s): CKTOTAL, CKMB, CKMBINDEX, TROPONINI in the last 72 hours.  BNP: BNP (last 3 results) Recent Labs    01/23/20 0338  BNP 1,370.5*    ProBNP (last 3 results) No results for input(s): PROBNP in the last 8760 hours.   D-Dimer No results for input(s): DDIMER in the last 72 hours. Hemoglobin A1C No results for input(s): HGBA1C in the last 72 hours. Fasting Lipid Panel No results for input(s): CHOL, HDL, LDLCALC, TRIG, CHOLHDL, LDLDIRECT in the last 72 hours. Thyroid Function Tests No results for input(s): TSH, T4TOTAL, T3FREE, THYROIDAB in the last 72 hours.  Invalid input(s): FREET3  Other results:   Imaging    No results found.   Medications:     Scheduled Medications: . [MAR Hold] apixaban  5 mg Oral BID  . [MAR Hold] aspirin  81 mg Oral Daily  . [MAR Hold] atorvastatin  40 mg Oral Daily  . [MAR Hold] bisacodyl  10 mg Oral Daily   Or  . [MAR Hold] bisacodyl  10 mg Rectal Daily  . [MAR Hold] Chlorhexidine Gluconate Cloth    6 each Topical q morning - 10a  . [MAR Hold] digoxin  0.125 mg Oral Daily  . [MAR Hold] docusate sodium  200 mg Oral Daily  . [MAR Hold] hydrocerin   Topical BID  . [MAR Hold] insulin aspart  0-24 Units Subcutaneous TID AC & HS  . [MAR Hold] losartan  12.5 mg Oral Daily  . [MAR Hold] mouth rinse  15 mL Mouth Rinse BID  . [MAR Hold] pantoprazole  40 mg Oral Daily  . [MAR Hold] sodium chloride flush  10-40 mL Intracatheter Q12H  . [MAR Hold] sodium chloride flush  3 mL Intravenous Q12H  . [MAR Hold] sodium chloride flush  3 mL Intravenous Q12H  . [MAR Hold] spironolactone  12.5 mg Oral Daily    Infusions: . [MAR Hold] sodium chloride Stopped (02/02/20 1300)  .  sodium chloride    . amiodarone 30 mg/hr (02/06/20 0511)    PRN Medications: [MAR Hold] sodium chloride, sodium chloride, [MAR Hold] metoprolol tartrate, [MAR Hold] midazolam, [MAR Hold]  morphine injection, [MAR Hold] ondansetron (ZOFRAN) IV, [MAR Hold] oxyCODONE, [MAR Hold] sodium chloride flush, [MAR Hold] sodium chloride flush, [MAR Hold] sodium chloride flush, [MAR Hold] traMADol     Assessment/Plan   1. Acute Systolic Heart Failure ECHO EF 15% with severe global HK, and moderate RV dysfunction.  Etiology possible ETOH versus HTN (denies severe ETOH use). TSH was ok. HIV NR  -4/28 LHC with severe 1v CAD. RHC with preserved cardiac output.  - S/p CABG x 1 w/ LIMA-LAD 5/4 - Milrinone stopped 5/7. Co-ox marginal but stable  at 58% - Volume status ok. Off lasix  - Continue spiro 12.5 and losartan 12.5. BP too low to titrate.  - with marginal co-ox, we will hold metoprolol for now - continue digoxin 0.125 mg - Renal function ok   2. CAD:  - LHC with severe 1VCAD - S/p CABG x 1 w/ LIMA-LAD 5/4 - continue post operative management per CT surgery - No s/s angina - on ASA 81, statin, atorvastatin 40 mg, b-blocker  3. Cellulits , R and LLE legs: - Partial Thickness wound with palpable pedal pulses.  - Improved. - WOC has seen. Legs wrapped  4. A fib/flutter RVR - rates now in 80-90s on IV amio. Will continue Eliquis for now. He is Jehovah's Witness so would not continue long-term as he will not accept blood products.  -continue PPI - will hold metoprolol for now given marginal co-ox - Plan TEE/DC-CV this am   5 Social: - He has no insurance so we will need to provide 30 days of HF meds at discharge.  Discussed with HF Pharmacy and HF SW for med assist. Referred to HF Paramedicine.     Length of Stay: 14  Nuria Phebus, MD  02/06/2020, 8:09 AM  Advanced Heart Failure Team Pager [phone] (M-F; 7a - 4p)  Please contact [facility] for night-coverage after hours (4p  -7a ) and weekends on [email or web address]   

## 2020-02-06 NOTE — TOC Progression Note (Signed)
Transition of Care [facility]) - Progression Note    Patient Details  Name: Joaopedro Eschbach MRN: [id number] Date of Birth: 06-26-1956  [facility] of [facility] CM/SW Contact  Leone Haven, RN Phone Number: 02/06/2020, 7:17 AM  Clinical Narrative:    5/11- NCM received consult for eliquis , patient does not have insurance, HF Team will be assisting patient with medications with [name] will see if they can assist with eliquis also.    NCM spoke with patient, he lives with brother and brother will be his transport at discharge.  NCM asked patient if he would like [facility] for CHF disease management, he said yes.  NCM lvm with Cassie with [facility] because this will need to be for charity.  Patient will need ast with meds thru [facility] and [facility].  Apt has been made with [facility] also on [facility]. IF patient needs Entresto the HF clinic will help patient with the assistance with this.   4/30 1300- Per Cassie with [facility] they are able to take him for charity for [facility] for CHF.  He is also set up with paramedicine and HF team will be helping him with his medications. NCM left message with Dennison Bulla Revels in financial  Counseling to see if she can help him with applying for Medicaid.          Expected Discharge Plan and Services         Living arrangements for the past 2 months: Single Family Home                                       Social Determinants of Health (SDOH) Interventions    Readmission Risk Interventions No flowsheet data found.

## 2020-02-06 NOTE — H&P (View-Only) (Signed)
Advanced Heart Failure Rounding Note  PCP-Cardiologist: No primary care provider on file.   Subjective:    S/p Off pump CABG x 1 w/  LIMA-LAD 5/4.   Milrinone stopped 5/7. Co-ox marginal but stable at 58% this am .   Went into AFL on 5/8. Remains in atrial flutter. Rate improved on IV amiodarone. On Eliquis with no obvious bleeding but hgb drifting down slowly. .   Feels weak. No SOB,orthopnea or PND.    Objective:   Weight Range: 101.4 kg Body mass index is 30.32 kg/m.   Vital Signs:   Temp:  [97.4 F (36.3 C)-98.2 F (36.8 C)] 98.2 F (36.8 C) (05/11 0800) Pulse Rate:  [85-98] 87 (05/11 0800) Resp:  [17-23] 20 (05/11 0800) BP: (98-112)/(61-77) 98/64 (05/11 0800) SpO2:  [95 %-100 %] 99 % (05/11 0800) Weight:  [101.4 kg] 101.4 kg (05/11 0745) Last BM Date: 02/03/20  Weight change: Filed Weights   02/05/20 0500 02/06/20 0500 02/06/20 0745  Weight: 101.1 kg 101.4 kg 101.4 kg    Intake/Output:   Intake/Output Summary (Last 24 hours) at 02/06/2020 0809 Last data filed at 02/06/2020 0340 Gross per 24 hour  Intake 447.07 ml  Output 1100 ml  Net -652.93 ml      Physical Exam   General:  Weak appearing. No resp difficulty HEENT: normal Neck: supple. no JVD. Carotids 2+ bilat; no bruits. No lymphadenopathy or thryomegaly appreciated. Cor: PMI nondisplaced. Irregular rate & rhythm. No rubs, gallops or murmurs. Lungs: clear Abdomen: soft, nontender, nondistended. No hepatosplenomegaly. No bruits or masses. Good bowel sounds. Extremities: no cyanosis, clubbing, rash, legs wrapped. Chronic venous stasis schanges Neuro: alert & orientedx3, cranial nerves grossly intact. moves all 4 extremities w/o difficulty. Affect pleasant    Telemetry    AFL 80-90s Personally reviewed   Labs    CBC Recent Labs    02/05/20 1120  WBC 7.2  HGB 11.3*  HCT 34.9*  MCV 91.8  PLT 276   Basic Metabolic Panel Recent Labs    15/17/61 1308  NA 137  K 4.1  CL 97*   CO2 27  GLUCOSE 184*  BUN 22  CREATININE 0.97  CALCIUM 8.8*   Liver Function Tests No results for input(s): AST, ALT, ALKPHOS, BILITOT, PROT, ALBUMIN in the last 72 hours. No results for input(s): LIPASE, AMYLASE in the last 72 hours. Cardiac Enzymes No results for input(s): CKTOTAL, CKMB, CKMBINDEX, TROPONINI in the last 72 hours.  BNP: BNP (last 3 results) Recent Labs    01/23/20 0338  BNP 1,370.5*    ProBNP (last 3 results) No results for input(s): PROBNP in the last 8760 hours.   D-Dimer No results for input(s): DDIMER in the last 72 hours. Hemoglobin A1C No results for input(s): HGBA1C in the last 72 hours. Fasting Lipid Panel No results for input(s): CHOL, HDL, LDLCALC, TRIG, CHOLHDL, LDLDIRECT in the last 72 hours. Thyroid Function Tests No results for input(s): TSH, T4TOTAL, T3FREE, THYROIDAB in the last 72 hours.  Invalid input(s): FREET3  Other results:   Imaging    No results found.   Medications:     Scheduled Medications: . [MAR Hold] apixaban  5 mg Oral BID  . [MAR Hold] aspirin  81 mg Oral Daily  . [MAR Hold] atorvastatin  40 mg Oral Daily  . [MAR Hold] bisacodyl  10 mg Oral Daily   Or  . [MAR Hold] bisacodyl  10 mg Rectal Daily  . [MAR Hold] Chlorhexidine Gluconate Cloth  6 each Topical q morning - 10a  . [MAR Hold] digoxin  0.125 mg Oral Daily  . [MAR Hold] docusate sodium  200 mg Oral Daily  . [MAR Hold] hydrocerin   Topical BID  . [MAR Hold] insulin aspart  0-24 Units Subcutaneous TID AC & HS  . [MAR Hold] losartan  12.5 mg Oral Daily  . [MAR Hold] mouth rinse  15 mL Mouth Rinse BID  . [MAR Hold] pantoprazole  40 mg Oral Daily  . [MAR Hold] sodium chloride flush  10-40 mL Intracatheter Q12H  . [MAR Hold] sodium chloride flush  3 mL Intravenous Q12H  . [MAR Hold] sodium chloride flush  3 mL Intravenous Q12H  . [MAR Hold] spironolactone  12.5 mg Oral Daily    Infusions: . [MAR Hold] sodium chloride Stopped (02/02/20 1300)  .  sodium chloride    . amiodarone 30 mg/hr (02/06/20 0511)    PRN Medications: [MAR Hold] sodium chloride, sodium chloride, [MAR Hold] metoprolol tartrate, [MAR Hold] midazolam, [MAR Hold]  morphine injection, [MAR Hold] ondansetron (ZOFRAN) IV, [MAR Hold] oxyCODONE, [MAR Hold] sodium chloride flush, [MAR Hold] sodium chloride flush, [MAR Hold] sodium chloride flush, [MAR Hold] traMADol     Assessment/Plan   1. Acute Systolic Heart Failure ECHO EF 15% with severe global HK, and moderate RV dysfunction.  Etiology possible ETOH versus HTN (denies severe ETOH use). TSH was ok. HIV NR  -4/28 LHC with severe 1v CAD. RHC with preserved cardiac output.  - S/p CABG x 1 w/ LIMA-LAD 5/4 - Milrinone stopped 5/7. Co-ox marginal but stable  at 58% - Volume status ok. Off lasix  - Continue spiro 12.5 and losartan 12.5. BP too low to titrate.  - with marginal co-ox, we will hold metoprolol for now - continue digoxin 0.125 mg - Renal function ok   2. CAD:  - LHC with severe 1VCAD - S/p CABG x 1 w/ LIMA-LAD 5/4 - continue post operative management per CT surgery - No s/s angina - on ASA 81, statin, atorvastatin 40 mg, b-blocker  3. Cellulits , R and LLE legs: - Partial Thickness wound with palpable pedal pulses.  - Improved. - WOC has seen. Legs wrapped  4. A fib/flutter RVR - rates now in 80-90s on IV amio. Will continue Eliquis for now. He is Jehovah's Witness so would not continue long-term as he will not accept blood products.  -continue PPI - will hold metoprolol for now given marginal co-ox - Plan TEE/DC-CV this am   5 Social: - He has no insurance so we will need to provide 30 days of HF meds at discharge.  Discussed with HF Pharmacy and HF SW for med assist. Referred to HF Paramedicine.     Length of Stay: [place], MD  02/06/2020, 8:09 AM  Advanced Heart Failure Team Pager [phone] (M-F; 7a - 4p)  Please contact [facility] for night-coverage after hours (4p  -7a ) and weekends on [email or web address]

## 2020-02-07 LAB — BASIC METABOLIC PANEL
Anion gap: 9 (ref 5–15)
BUN: 17 mg/dL (ref 8–23)
CO2: 27 mmol/L (ref 22–32)
Calcium: 9.2 mg/dL (ref 8.9–10.3)
Chloride: 99 mmol/L (ref 98–111)
Creatinine, Ser: 0.94 mg/dL (ref 0.61–1.24)
GFR calc Af Amer: >60 mL/min (ref >60)
GFR calc non Af Amer: >60 mL/min (ref >60)
Glucose, Bld: 105 mg/dL — ABNORMAL HIGH (ref 70–99)
Potassium: 4.6 mmol/L (ref 3.5–5.1)
Sodium: 135 mmol/L (ref 135–145)

## 2020-02-07 LAB — GLUCOSE, CAPILLARY
Glucose-Capillary: 92 mg/dL (ref 70–99)
Glucose-Capillary: 99 mg/dL (ref 70–99)
Glucose-Capillary: 93 mg/dL (ref 70–99)
Glucose-Capillary: 102 mg/dL — ABNORMAL HIGH (ref 70–99)

## 2020-02-07 LAB — COOXEMETRY PANEL
Carboxyhemoglobin: 1.5 % (ref 0.5–1.5)
Methemoglobin: 0.9 % (ref 0.0–1.5)
O2 Saturation: 60.4 %
Total hemoglobin: 13.2 g/dL (ref 12.0–16.0)

## 2020-02-07 MED ORDER — AMIODARONE HCL 200 MG PO TABS
400.0000 mg | ORAL_TABLET | Freq: Two times a day (BID) | ORAL | Status: DC
  Administered 2020-02-07 – 2020-02-08 (×3): 400 mg via ORAL
  Filled 2020-02-07 (×3): qty 2

## 2020-02-07 NOTE — Progress Notes (Signed)
CARDIAC REHAB PHASE I   PRE:  Rate/Rhythm: 84 SR  BP:  Sitting: 106/78      SaO2: 94 RA  MODE:  Ambulation: 400 ft   POST:  Rate/Rhythm: 95 SR  BP:  Sitting: 119/73    SaO2: 95 RA  Pt helped out of bed, then ambulated 463ft in hall standby assist with front wheel walker. Pt continues to improve, able to rise from seated position with minimal help, just consistent reminders of sternal precautions. Pt denied CP, SOB, or dizziness throughout the walk. Pt returned to recliner, RN at bedside. Pt requesting walker and BSC for home use, RN made aware. Encouraged continued IS use and walks. Will continue to follow.  [id number] Reynold Bowen, RN BSN 02/07/2020 11:50 AM

## 2020-02-07 NOTE — Progress Notes (Addendum)
Advanced Heart Failure Rounding Note  PCP-Cardiologist: No primary care provider on file.   Subjective:    S/p Off pump CABG x 1 w/  LIMA-LAD 5/4.   Milrinone stopped 5/7. Co-ox stable at 60%.    S/P TEE/DC-CV 02/06/20 with conversion to NSR.   Feeling ok. Denies SOB.    Objective:   Weight Range: 101.2 kg Body mass index is 30.26 kg/m.   Vital Signs:   Temp:  [97.3 F (36.3 C)-98.7 F (37.1 C)] 97.8 F (36.6 C) (05/12 0517) Pulse Rate:  [74-82] 82 (05/11 2005) Resp:  [17-23] 17 (05/12 0803) BP: (98-110)/(57-77) 102/63 (05/12 0736) SpO2:  [98 %-99 %] 98 % (05/12 0803) Weight:  [101.2 kg] 101.2 kg (05/12 0517) Last BM Date: 02/06/20  Weight change: Filed Weights   02/06/20 0500 02/06/20 0745 02/07/20 0517  Weight: 101.4 kg 101.4 kg 101.2 kg    Intake/Output:   Intake/Output Summary (Last 24 hours) at 02/07/2020 0824 Last data filed at 02/07/2020 0600 Gross per 24 hour  Intake 374.86 ml  Output 800 ml  Net -425.14 ml      Physical Exam  General:  In bed.  No resp difficulty HEENT: normal Neck: supple. no JVD. Carotids 2+ bilat; no bruits. No lymphadenopathy or thryomegaly appreciated. Cor: PMI nondisplaced. Regular rate & rhythm. No rubs, gallops or murmurs. Sternal incision approximated.  Lungs: clear Abdomen: soft, nontender, nondistended. No hepatosplenomegaly. No bruits or masses. Good bowel sounds. Extremities: no cyanosis, clubbing, rash, edema. RUE PICC. R and LLE ace wraps.   Neuro: alert & orientedx3, cranial nerves grossly intact. moves all 4 extremities w/o difficulty. Affect pleasant   Telemetry   NSR with PACs 80s    Labs    CBC Recent Labs    02/05/20 1120  WBC 7.2  HGB 11.3*  HCT 34.9*  MCV 91.8  PLT 276   Basic Metabolic Panel Recent Labs    13/24/40 1308  NA 137  K 4.1  CL 97*  CO2 27  GLUCOSE 184*  BUN 22  CREATININE 0.97  CALCIUM 8.8*   Liver Function Tests No results for input(s): AST, ALT, ALKPHOS,  BILITOT, PROT, ALBUMIN in the last 72 hours. No results for input(s): LIPASE, AMYLASE in the last 72 hours. Cardiac Enzymes No results for input(s): CKTOTAL, CKMB, CKMBINDEX, TROPONINI in the last 72 hours.  BNP: BNP (last 3 results) Recent Labs    01/23/20 0338  BNP 1,370.5*    ProBNP (last 3 results) No results for input(s): PROBNP in the last 8760 hours.   D-Dimer No results for input(s): DDIMER in the last 72 hours. Hemoglobin A1C No results for input(s): HGBA1C in the last 72 hours. Fasting Lipid Panel No results for input(s): CHOL, HDL, LDLCALC, TRIG, CHOLHDL, LDLDIRECT in the last 72 hours. Thyroid Function Tests No results for input(s): TSH, T4TOTAL, T3FREE, THYROIDAB in the last 72 hours.  Invalid input(s): FREET3  Other results:   Imaging    No results found.   Medications:     Scheduled Medications: . apixaban  5 mg Oral BID  . aspirin  81 mg Oral Daily  . atorvastatin  40 mg Oral Daily  . bisacodyl  10 mg Oral Daily   Or  . bisacodyl  10 mg Rectal Daily  . Chlorhexidine Gluconate Cloth  6 each Topical q morning - 10a  . digoxin  0.125 mg Oral Daily  . docusate sodium  200 mg Oral Daily  . hydrocerin   Topical  BID  . insulin aspart  0-24 Units Subcutaneous TID AC & HS  . losartan  12.5 mg Oral Daily  . mouth rinse  15 mL Mouth Rinse BID  . pantoprazole  40 mg Oral Daily  . sodium chloride flush  10-40 mL Intracatheter Q12H  . sodium chloride flush  3 mL Intravenous Q12H  . sodium chloride flush  3 mL Intravenous Q12H  . spironolactone  12.5 mg Oral Daily    Infusions: . sodium chloride Stopped (02/02/20 1300)  . amiodarone 30 mg/hr (02/07/20 0800)    PRN Medications: sodium chloride, metoprolol tartrate, midazolam, morphine injection, ondansetron (ZOFRAN) IV, oxyCODONE, sodium chloride flush, sodium chloride flush, sodium chloride flush, traMADol     Assessment/Plan   1. Acute Systolic Heart Failure ECHO EF 15% with severe global  HK, and moderate RV dysfunction.  Etiology possible ETOH versus HTN (denies severe ETOH use). TSH was ok. HIV NR  -4/28 LHC with severe 1v CAD. RHC with preserved cardiac output.  - S/p CABG x 1 w/ LIMA-LAD 5/4 - Milrinone stopped 5/7. Co-ox stable 60% - Volume status ok. Off lasix  - Continue spiro 12.5 and losartan 12.5. Hold off on titration.  - continue digoxin 0.125 mg  2. CAD:  - LHC with severe 1VCAD - S/p CABG x 1 w/ LIMA-LAD 5/4 - continue post operative management per CT surgery - No s/s angina - on ASA 81, statin, atorvastatin 40 mg,  3. Cellulits , R and LLE legs: -Continue ace wraps.  - WOC has seen. Legs wrapped  4. A fib/flutter RVR - Will continue Eliquis for now. He is Jehovah's Witness so would not continue long-term as he will not accept blood products. He does not want eliquis long term.  -continue PPI -02/06/20 S/P TEE/DC-CV with conversion to NSR  - Maintaining NSR.  -Stop IV amio and start amio 400 mg twice a day.   5 Social: - He has no insurance so we will need to provide 30 days of HF meds at discharge.  Discussed with HF Pharmacy and HF SW for med assist. Referred to HF Paramedicine.   Length of Stay: 15  Amy Clegg, NP  02/07/2020, 8:24 AM  Advanced Heart Failure Team Pager [phone] (M-F; 7a - 4p)  Please contact [facility] for night-coverage after hours (4p -7a ) and weekends on [email or web address]  Patient seen and examined with the above-signed Advanced Practice Provider and/or Housestaff. I personally reviewed laboratory data, imaging studies and relevant notes. I independently examined the patient and formulated the important aspects of the plan. I have edited the note to reflect any of my changes or salient points. I have personally discussed the plan with the patient and/or family.  S/p TEE-DCCV yesterday. Maintaining NSR on IV amio. There is severe biventricular function on TEE. Stable on Eliquis.Hgb stable   General:  Sitting up in bed. No resp  difficulty HEENT: normal Neck: supple. no JVD. Carotids 2+ bilat; no bruits. No lymphadenopathy or thryomegaly appreciated. Cor: Sternal wound ok. PMI nondisplaced. Regular rate & rhythm. No rubs, gallops or murmurs. Lungs: clear Abdomen: soft, nontender, nondistended. No hepatosplenomegaly. No bruits or masses. Good bowel sounds. Extremities: no cyanosis, clubbing, rash, edema Neuro: alert & orientedx3, cranial nerves grossly intact. moves all 4 extremities w/o difficulty. Affect pleasant  Maintaining NSR after DC-CV. Can switch to po amio. Continue Eliquis for 3-4 weeks post DC-CV and then can stop with Jehovah's Witness status. Will ask TCTS if ok to stop ASA. Continue current  HF regimen.  Glori Bickers, MD  2:05 PM

## 2020-02-07 NOTE — Plan of Care (Signed)
  Problem: Education: Goal: Knowledge of General Education information will improve Description: Including pain rating scale, medication(s)/side effects and non-pharmacologic comfort measures Outcome: Progressing   Problem: Health Behavior/Discharge Planning: Goal: Ability to manage health-related needs will improve Outcome: Progressing   Problem: Clinical Measurements: Goal: Ability to maintain clinical measurements within normal limits will improve Outcome: Progressing Goal: Will remain free from infection Outcome: Progressing Goal: Cardiovascular complication will be avoided Outcome: Progressing   Problem: Activity: Goal: Risk for activity intolerance will decrease Outcome: Progressing   Problem: Skin Integrity: Goal: Risk for impaired skin integrity will decrease Outcome: Progressing   Problem: Activity: Goal: Capacity to carry out activities will improve Outcome: Progressing

## 2020-02-07 NOTE — Progress Notes (Signed)
Ambulated along the hallway with the SWOT RN using front wheel walker about 450 feet , tolerated well.

## 2020-02-07 NOTE — Progress Notes (Addendum)
      [address]       [place] [address]             [phone]      1 Day Post-Op Procedure(s) (LRB): TRANSESOPHAGEAL ECHOCARDIOGRAM (TEE) (N/A) CARDIOVERSION (N/A) Subjective: Feels pretty well Maintaining sinus with some PAC's  Objective: Vital signs in last 24 hours: Temp:  [97.3 F (36.3 C)-98.7 F (37.1 C)] 97.8 F (36.6 C) (05/12 0517) Pulse Rate:  [74-82] 82 (05/11 2005) Cardiac Rhythm: Normal sinus rhythm (05/12 0803) Resp:  [18-23] 21 (05/12 0736) BP: (98-110)/(57-77) 102/63 (05/12 0736) SpO2:  [98 %-99 %] 98 % (05/11 2005) Weight:  [101.2 kg] 101.2 kg (05/12 0517)  Hemodynamic parameters for last 24 hours:    Intake/Output from previous day: 05/11 0701 - 05/12 0700 In: 374.9 [I.V.:374.9] Out: 800 [Urine:800] Intake/Output this shift: No intake/output data recorded.  General appearance: alert, cooperative and no distress Heart: slightly irregular Lungs: min dim in the bases Abdomen: benign Extremities: wrapped in ace Wound: MWF dressing changes- improving  Lab Results: Recent Labs    02/05/20 1120  WBC 7.2  HGB 11.3*  HCT 34.9*  PLT 276   BMET:  Recent Labs    02/05/20 1308  NA 137  K 4.1  CL 97*  CO2 27  GLUCOSE 184*  BUN 22  CREATININE 0.97  CALCIUM 8.8*    PT/INR: No results for input(s): LABPROT, INR in the last 72 hours. ABG    Component Value Date/Time   PHART 7.328 (L) 01/30/2020 1513   HCO3 25.1 01/30/2020 1513   TCO2 27 01/30/2020 1513   ACIDBASEDEF 2.0 01/30/2020 1513   O2SAT 60.4 02/07/2020 0520   CBG (last 3)  Recent Labs    02/06/20 1618 02/06/20 2104 02/07/20 0605  GLUCAP 121* 94 92    Meds Scheduled Meds: . apixaban  5 mg Oral BID  . aspirin  81 mg Oral Daily  . atorvastatin  40 mg Oral Daily  . bisacodyl  10 mg Oral Daily   Or  . bisacodyl  10 mg Rectal Daily  . Chlorhexidine Gluconate Cloth  6 each Topical q morning - 10a  . digoxin  0.125 mg Oral Daily  . docusate sodium   200 mg Oral Daily  . hydrocerin   Topical BID  . insulin aspart  0-24 Units Subcutaneous TID AC & HS  . losartan  12.5 mg Oral Daily  . mouth rinse  15 mL Mouth Rinse BID  . pantoprazole  40 mg Oral Daily  . sodium chloride flush  10-40 mL Intracatheter Q12H  . sodium chloride flush  3 mL Intravenous Q12H  . sodium chloride flush  3 mL Intravenous Q12H  . spironolactone  12.5 mg Oral Daily   Continuous Infusions: . sodium chloride Stopped (02/02/20 1300)  . amiodarone 30 mg/hr (02/07/20 0611)   PRN Meds:.sodium chloride, metoprolol tartrate, midazolam, morphine injection, ondansetron (ZOFRAN) IV, oxyCODONE, sodium chloride flush, sodium chloride flush, sodium chloride flush, traMADol  Xrays No results found.  Assessment/Plan: S/P Procedure(s) (LRB): TRANSESOPHAGEAL ECHOCARDIOGRAM (TEE) (N/A) CARDIOVERSION (N/A)  1 conts steady progress 2 cardioversion successful, having some PAC's- on amio gtt, AHF team managing cardiology issues/HFrEF, Co-Ox 60  3 sats good on RA 4 BS well controlled 5 cont rehab/pulm toilet   LOS: 15 days    Rowe Clack PA-C Pager [phone] 02/07/2020  Doing well Sinus this morning. On amnio p.o. Dispo planning  Jef Futch Keane Scrape

## 2020-02-08 ENCOUNTER — Ambulatory Visit: Payer: Self-pay | Admitting: Thoracic Surgery (Cardiothoracic Vascular Surgery)

## 2020-02-08 ENCOUNTER — Encounter (HOSPITAL_COMMUNITY): Payer: Self-pay

## 2020-02-08 LAB — DIGOXIN LEVEL: Digoxin Level: 0.4 ng/mL — ABNORMAL LOW (ref 0.8–2.0)

## 2020-02-08 LAB — GLUCOSE, CAPILLARY: Glucose-Capillary: 72 mg/dL (ref 70–99)

## 2020-02-08 LAB — BASIC METABOLIC PANEL
Anion gap: 8 (ref 5–15)
BUN: 16 mg/dL (ref 8–23)
CO2: 26 mmol/L (ref 22–32)
Calcium: 8.8 mg/dL — ABNORMAL LOW (ref 8.9–10.3)
Chloride: 103 mmol/L (ref 98–111)
Creatinine, Ser: 0.92 mg/dL (ref 0.61–1.24)
GFR calc Af Amer: >60 mL/min (ref >60)
GFR calc non Af Amer: >60 mL/min (ref >60)
Glucose, Bld: 96 mg/dL (ref 70–99)
Potassium: 3.9 mmol/L (ref 3.5–5.1)
Sodium: 137 mmol/L (ref 135–145)

## 2020-02-08 MED ORDER — ATORVASTATIN CALCIUM 40 MG PO TABS: 40.0000 mg | ORAL_TABLET | Freq: Every day | ORAL | 1 refills | Status: DC

## 2020-02-08 MED ORDER — LOSARTAN POTASSIUM 25 MG PO TABS: 12.5000 mg | ORAL_TABLET | Freq: Every day | ORAL | 1 refills | Status: DC

## 2020-02-08 MED ORDER — HYDROCERIN EX CREA: 1.0000 "application " | TOPICAL_CREAM | Freq: Two times a day (BID) | CUTANEOUS | 0 refills | Status: DC

## 2020-02-08 MED ORDER — SPIRONOLACTONE 25 MG PO TABS: 12.5000 mg | ORAL_TABLET | Freq: Every day | ORAL | 1 refills | Status: DC

## 2020-02-08 MED ORDER — DIGOXIN 125 MCG PO TABS: 0.1250 mg | ORAL_TABLET | Freq: Every day | ORAL | 1 refills | Status: DC

## 2020-02-08 MED ORDER — TRAMADOL HCL 50 MG PO TABS: 50.0000 mg | ORAL_TABLET | Freq: Four times a day (QID) | ORAL | 0 refills | Status: DC | PRN

## 2020-02-08 MED ORDER — APIXABAN 5 MG PO TABS: 5.0000 mg | ORAL_TABLET | Freq: Two times a day (BID) | ORAL | 0 refills | Status: DC

## 2020-02-08 MED ORDER — AMIODARONE HCL 200 MG PO TABS: 400.0000 mg | ORAL_TABLET | Freq: Two times a day (BID) | ORAL | 1 refills | Status: DC

## 2020-02-08 MED ORDER — ASPIRIN 81 MG PO CHEW: 81.0000 mg | CHEWABLE_TABLET | Freq: Every day | ORAL | Status: AC

## 2020-02-08 NOTE — Progress Notes (Signed)
Right DL PICC removed per protocol for pt's discharge. Manual pressure applied for 5 mins. No bleeding or swelling noted. Instructed patient to remain in bed until 1145. Educated patient about S/S of infection and when to call MD; no heavy lifting or pressure on right side for 24 hours; keep dressing dry and intact until 1145 tomorrow. Pt verbalized comprehension.

## 2020-02-08 NOTE — TOC Transition Note (Addendum)
Transition of Care [facility]) - CM/SW Discharge Note   Patient Details  Name: Corey Brady MRN: [id number] Date of Birth: 08-22-1956  Transition of Care [facility]) CM/SW Contact:  Leone Haven, RN Phone Number: 02/08/2020, 12:43 PM   Clinical Narrative:    5/13- Patient is for dc today, Cassie with [facility] notified that patient is for dc today.  NCM made referral to [facility] with [employer] for bsc and rolling walker. HF team to help patient with medications, patient is also set up with paramedicine. Thru HF team.  5/11- NCM received consult for eliquis , patient does not have insurance, HF Team will be assisting patient with medications with [name] will see if they can assist with eliquis also.    NCM spoke with patient, he lives with brother and brother will be his transport at discharge. NCM asked patient if he would like [facility] for CHF disease management, he said yes. NCM lvm with Cassie with [facility] because this will need to be for charity. Patient will need ast with meds thru [facility] pharmacy and [facility]. Apt has been made with CHW clinic also on AVS. IF patient needs Entresto the HF clinic will help patient with the assistance with this.  4/30 1300- Per Cassie with [facility] they are able to take him for charity for [facility] for CHF. He is also set up with paramedicine and HF team will be helping him with his medications. NCM left message with Dennison Bulla Revels in financial Counseling to see if she can help him with applying for Medicaid.     Final next level of care: Home w Home Health Services Barriers to Discharge: No Barriers Identified   Patient Goals and CMS Choice        Discharge Placement                       Discharge Plan and Services                DME Arranged: Walker rolling, Bedside commode DME Agency: [facility] Date DME Agency Contacted: 02/08/20 Time DME Agency Contacted: 1242 Representative spoke with at DME Agency: zach HH Arranged: RN HH  Agency: [facility] Date HH Agency Contacted: 02/08/20 Time HH Agency Contacted: 1242 Representative spoke with at [facility] Agency: Cassie  Social Determinants of Health (SDOH) Interventions     Readmission Risk Interventions No flowsheet data found.

## 2020-02-08 NOTE — Progress Notes (Signed)
Medicine delivered from the pharmacy, [facility] and walker delivered . Discharge instructions  given to pt and brother. Belongings taken home.

## 2020-02-08 NOTE — Progress Notes (Signed)
      [address]       [place] [address]             [phone]      2 Days Post-Op Procedure(s) (LRB): TRANSESOPHAGEAL ECHOCARDIOGRAM (TEE) (N/A) CARDIOVERSION (N/A) Subjective: Feels ok, no specific c/o  Objective: Vital signs in last 24 hours: Temp:  [97.5 F (36.4 C)-98 F (36.7 C)] 97.6 F (36.4 C) (05/13 0654) Pulse Rate:  [69-81] 69 (05/13 0654) Cardiac Rhythm: Normal sinus rhythm;Bundle branch block (05/13 0751) Resp:  [17-23] 19 (05/13 0654) BP: (100-112)/(64-84) 100/64 (05/13 0654) SpO2:  [90 %-99 %] 94 % (05/13 0654)  Hemodynamic parameters for last 24 hours:    Intake/Output from previous day: 05/12 0701 - 05/13 0700 In: 246.5 [P.O.:200; I.V.:46.5] Out: 950 [Urine:950] Intake/Output this shift: No intake/output data recorded.  General appearance: alert, cooperative and no distress Heart: irregularly irregular rhythm Lungs: clear to auscultation bilaterally Abdomen: benign Extremities: ACE wraps in place Wound: incis healing well  Lab Results: Recent Labs    02/05/20 1120  WBC 7.2  HGB 11.3*  HCT 34.9*  PLT 276   BMET:  Recent Labs    02/07/20 1130 02/08/20 0506  NA 135 137  K 4.6 3.9  CL 99 103  CO2 27 26  GLUCOSE 105* 96  BUN 17 16  CREATININE 0.94 0.92  CALCIUM 9.2 8.8*    PT/INR: No results for input(s): LABPROT, INR in the last 72 hours. ABG    Component Value Date/Time   PHART 7.328 (L) 01/30/2020 1513   HCO3 25.1 01/30/2020 1513   TCO2 27 01/30/2020 1513   ACIDBASEDEF 2.0 01/30/2020 1513   O2SAT 60.4 02/07/2020 0520   CBG (last 3)  Recent Labs    02/07/20 1112 02/07/20 1618 02/07/20 2107  GLUCAP 99 93 102*    Meds Scheduled Meds: . amiodarone  400 mg Oral BID  . apixaban  5 mg Oral BID  . aspirin  81 mg Oral Daily  . atorvastatin  40 mg Oral Daily  . bisacodyl  10 mg Oral Daily   Or  . bisacodyl  10 mg Rectal Daily  . Chlorhexidine Gluconate Cloth  6 each Topical q morning - 10a  .  digoxin  0.125 mg Oral Daily  . docusate sodium  200 mg Oral Daily  . hydrocerin   Topical BID  . insulin aspart  0-24 Units Subcutaneous TID AC & HS  . losartan  12.5 mg Oral Daily  . mouth rinse  15 mL Mouth Rinse BID  . pantoprazole  40 mg Oral Daily  . sodium chloride flush  10-40 mL Intracatheter Q12H  . sodium chloride flush  3 mL Intravenous Q12H  . sodium chloride flush  3 mL Intravenous Q12H  . spironolactone  12.5 mg Oral Daily   Continuous Infusions: . sodium chloride Stopped (02/02/20 1300)   PRN Meds:.sodium chloride, metoprolol tartrate, midazolam, morphine injection, ondansetron (ZOFRAN) IV, oxyCODONE, sodium chloride flush, sodium chloride flush, sodium chloride flush, traMADol  Xrays No results found.  Assessment/Plan: S/P Procedure(s) (LRB): TRANSESOPHAGEAL ECHOCARDIOGRAM (TEE) (N/A) CARDIOVERSION (N/A)  1 clinically stable, no afib but freq PAC's , 3 beat run of Vtach 2  placed orders for home care for AHF, wound care 3 home today if ok with AHF team     LOS: 16 days    Rowe Clack PA-C Pager [phone] 02/08/2020

## 2020-02-08 NOTE — Progress Notes (Addendum)
Advanced Heart Failure Rounding Note  PCP-Cardiologist: No primary care provider on file.   Subjective:    S/p Off pump CABG x 1 w/  LIMA-LAD 5/4.   Milrinone stopped 5/7. Co-ox stable at 60%.    S/P TEE/DC-CV 02/06/20 with conversion to NSR.   Denies SOB.    Objective:   Weight Range: 101.2 kg Body mass index is 30.26 kg/m.   Vital Signs:   Temp:  [97.5 F (36.4 C)-98 F (36.7 C)] 97.6 F (36.4 C) (05/13 0654) Pulse Rate:  [69-81] 69 (05/13 0654) Resp:  [18-23] 19 (05/13 0654) BP: (100-112)/(64-84) 100/64 (05/13 0654) SpO2:  [90 %-99 %] 94 % (05/13 0654) Last BM Date: 02/06/20  Weight change: Filed Weights   02/06/20 0500 02/06/20 0745 02/07/20 0517  Weight: 101.4 kg 101.4 kg 101.2 kg    Intake/Output:   Intake/Output Summary (Last 24 hours) at 02/08/2020 0942 Last data filed at 02/08/2020 0751 Gross per 24 hour  Intake 486.47 ml  Output 750 ml  Net -263.53 ml      Physical Exam  General:  Well appearing. No resp difficulty. Sitting in the chair.  HEENT: normal anicteric Neck: supple. no JVD. Carotids 2+ bilat; no bruits. No lymphadenopathy or thryomegaly appreciated. Cor: PMI nondisplaced. Regular rate & rhythm. No rubs, gallops or murmurs. Sternal incision Lungs: clear no wheeze  Abdomen: soft, nontender, nondistended. No hepatosplenomegaly. No bruits or masses. Good bowel sounds. Extremities: no cyanosis, clubbing, rash, edema + wraps. Chronic venous stasis changes Neuro: alert & orientedx3, cranial nerves grossly intact. moves all 4 extremities w/o difficulty. Affect pleasant   Telemetry   NSR 80s  Personally reviewed   Labs    CBC Recent Labs    02/05/20 1120  WBC 7.2  HGB 11.3*  HCT 34.9*  MCV 91.8  PLT 276   Basic Metabolic Panel Recent Labs    12/45/80 1130 02/08/20 0506  NA 135 137  K 4.6 3.9  CL 99 103  CO2 27 26  GLUCOSE 105* 96  BUN 17 16  CREATININE 0.94 0.92  CALCIUM 9.2 8.8*   Liver Function Tests No  results for input(s): AST, ALT, ALKPHOS, BILITOT, PROT, ALBUMIN in the last 72 hours. No results for input(s): LIPASE, AMYLASE in the last 72 hours. Cardiac Enzymes No results for input(s): CKTOTAL, CKMB, CKMBINDEX, TROPONINI in the last 72 hours.  BNP: BNP (last 3 results) Recent Labs    01/23/20 0338  BNP 1,370.5*    ProBNP (last 3 results) No results for input(s): PROBNP in the last 8760 hours.   D-Dimer No results for input(s): DDIMER in the last 72 hours. Hemoglobin A1C No results for input(s): HGBA1C in the last 72 hours. Fasting Lipid Panel No results for input(s): CHOL, HDL, LDLCALC, TRIG, CHOLHDL, LDLDIRECT in the last 72 hours. Thyroid Function Tests No results for input(s): TSH, T4TOTAL, T3FREE, THYROIDAB in the last 72 hours.  Invalid input(s): FREET3  Other results:   Imaging    No results found.   Medications:     Scheduled Medications: . amiodarone  400 mg Oral BID  . apixaban  5 mg Oral BID  . aspirin  81 mg Oral Daily  . atorvastatin  40 mg Oral Daily  . bisacodyl  10 mg Oral Daily   Or  . bisacodyl  10 mg Rectal Daily  . Chlorhexidine Gluconate Cloth  6 each Topical q morning - 10a  . digoxin  0.125 mg Oral Daily  . docusate sodium  200 mg Oral Daily  . hydrocerin   Topical BID  . insulin aspart  0-24 Units Subcutaneous TID AC & HS  . losartan  12.5 mg Oral Daily  . mouth rinse  15 mL Mouth Rinse BID  . pantoprazole  40 mg Oral Daily  . sodium chloride flush  10-40 mL Intracatheter Q12H  . sodium chloride flush  3 mL Intravenous Q12H  . sodium chloride flush  3 mL Intravenous Q12H  . spironolactone  12.5 mg Oral Daily    Infusions: . sodium chloride Stopped (02/02/20 1300)    PRN Medications: sodium chloride, metoprolol tartrate, midazolam, morphine injection, ondansetron (ZOFRAN) IV, oxyCODONE, sodium chloride flush, sodium chloride flush, sodium chloride flush, traMADol     Assessment/Plan   1. Acute Systolic Heart  Failure ECHO EF 15% with severe global HK, and moderate RV dysfunction.  Etiology possible ETOH versus HTN (denies severe ETOH use). TSH was ok. HIV NR  -4/28 LHC with severe 1v CAD. RHC with preserved cardiac output.  - S/p CABG x 1 w/ LIMA-LAD 5/4 - Milrinone stopped 5/7. Co-ox stable 60% - Volume status ok. Off lasix  - Continue spiro 12.5 and losartan 12.5. Hold off on titration.  - continue digoxin 0.125 mg, dig level stable.  - Renal function stable.   2. CAD:  - LHC with severe 1VCAD - S/p CABG x 1 w/ LIMA-LAD 5/4 - continue post operative management per CT surgery - No s/s angina - on ASA 81, statin, atorvastatin 40 mg,  3. Cellulits , R and LLE legs: -Continue ace wraps.  - WOC has seen. Legs wrapped  4. A fib/flutter RVR - Will continue Eliquis for now. He is Jehovah's Witness so would not continue long-term as he will not accept blood products. He does not want eliquis long term.  -continue PPI -02/06/20 S/P TEE/DC-CV with conversion to NSR  - Maintaining NSR.  -Continue amio taper as noted below. Continue eliquis 5 mg bid x 30 days.   5 Social: - He has no insurance so we will need to provide 30 days of HF meds at discharge.  Discussed with HF Pharmacy and HF SW for med assist. Referred to HF Paramedicine.   HF meds for d/c  Amio 400 mg bid x7 days, 200 mg bid x7 day, amio 200 mg daily  Eliquis 5 mg bid for 30 days Digoxin 0.125 mg daily Losartan 12.5 mg daily Spironolactone 12.5 mg daily  ASA 81 mg daily Atorvastatin 40 mg daily  Length of Stay: [place], NP  02/08/2020, 9:42 AM  Advanced Heart Failure Team Pager [phone] (M-F; 7a - 4p)  Please contact [facility] for night-coverage after hours (4p -7a ) and weekends on [email or web address]  Patient seen and examined with the above-signed Advanced Practice Provider and/or Housestaff. I personally reviewed laboratory data, imaging studies and relevant notes. I independently examined the patient and formulated  the important aspects of the plan. I have edited the note to reflect any of my changes or salient points. I have personally discussed the plan with the patient and/or family.  He is maintaining NSR. Volume status looks good. No bleeding on Eliquis. He is stable for d/c today. D/w Dr Kipp Brood  We will follow in HF Clinic soon. Paramedicine f/u arranged.   Exam as above (edited).   Glori Bickers, MD  1:38 PM

## 2020-02-08 NOTE — Progress Notes (Signed)
CARDIAC REHAB PHASE I   D/c education completed with pt. Pt educated on importance of site care and monitoring incision daily. Encouraged continued IS use, walks, and sternal precautions. Pt given in-the-tube sheet along with heart healthy diet and HF booklet. Reviewed daily weights, restrictions, and exercise guidelines. Will refer to CRP II [place]. Pt is interested in participating in Virtual Cardiac and Pulmonary Rehab. Pt advised that Virtual Cardiac and Pulmonary Rehab is provided at no cost to the patient.  Checklist:  1. Pt has smart device  ie smartphone and/or ipad for downloading an app  Yes 2. Reliable internet/wifi service    Yes 3. Understands how to use their smartphone and navigate within an app.  Yes  Pt verbalized understanding and is in agreement.  [id number] Reynold Bowen, RN BSN 02/08/2020 11:17 AM

## 2020-02-08 NOTE — Plan of Care (Signed)
  Problem: Education: Goal: Knowledge of General Education information will improve Description: Including pain rating scale, medication(s)/side effects and non-pharmacologic comfort measures Outcome: Progressing   Problem: Health Behavior/Discharge Planning: Goal: Ability to manage health-related needs will improve Outcome: Progressing   Problem: Clinical Measurements: Goal: Ability to maintain clinical measurements within normal limits will improve Outcome: Progressing Goal: Will remain free from infection Outcome: Progressing Goal: Diagnostic test results will improve Outcome: Progressing Goal: Cardiovascular complication will be avoided Outcome: Progressing   Problem: Activity: Goal: Risk for activity intolerance will decrease Outcome: Progressing   Problem: Skin Integrity: Goal: Risk for impaired skin integrity will decrease Outcome: Progressing   Problem: Education: Goal: Ability to demonstrate management of disease process will improve Outcome: Progressing Goal: Ability to verbalize understanding of medication therapies will improve Outcome: Progressing Goal: Individualized Educational Video(s) Outcome: Progressing   Problem: Activity: Goal: Capacity to carry out activities will improve Outcome: Progressing   Problem: Cardiac: Goal: Ability to achieve and maintain adequate cardiopulmonary perfusion will improve Outcome: Progressing   Problem: Education: Goal: Will demonstrate proper wound care and an understanding of methods to prevent future damage Outcome: Progressing Goal: Knowledge of disease or condition will improve Outcome: Progressing Goal: Knowledge of the prescribed therapeutic regimen will improve Outcome: Progressing Goal: Individualized Educational Video(s) Outcome: Progressing   Problem: Activity: Goal: Risk for activity intolerance will decrease Outcome: Progressing   Problem: Cardiac: Goal: Will achieve and/or maintain hemodynamic  stability Outcome: Progressing   Problem: Clinical Measurements: Goal: Postoperative complications will be avoided or minimized Outcome: Progressing   Problem: Respiratory: Goal: Respiratory status will improve Outcome: Progressing   Problem: Skin Integrity: Goal: Wound healing without signs and symptoms of infection Outcome: Progressing Goal: Risk for impaired skin integrity will decrease Outcome: Progressing   Problem: Urinary Elimination: Goal: Ability to achieve and maintain adequate renal perfusion and functioning will improve Outcome: Progressing   

## 2020-02-09 ENCOUNTER — Other Ambulatory Visit (HOSPITAL_COMMUNITY): Payer: Self-pay

## 2020-02-09 DIAGNOSIS — Z951 Presence of aortocoronary bypass graft: Secondary | ICD-10-CM

## 2020-02-09 DIAGNOSIS — I509 Heart failure, unspecified: Secondary | ICD-10-CM

## 2020-02-09 NOTE — Progress Notes (Signed)
Paramedicine Encounter    Patient ID: Corey Brady, male    DOB: 04-Aug-1956, 64 y.o.   MRN: [id number]   Patient Care Team: Patient, No Pcp Per as PCP - General (General Practice)  Patient Active Problem List   Diagnosis Date Noted  . S/P CABG (coronary artery bypass graft) 01/30/2020  . Acute heart failure ([place]) 01/23/2020    Current Outpatient Medications:  .  amiodarone (PACERONE) 200 MG tablet, Take 2 tablets (400 mg total) by mouth 2 (two) times daily. For 7 days, then 200 mg BID for 7 days, then 200 mg daily, Disp: 120 tablet, Rfl: 1 .  apixaban (ELIQUIS) 5 MG TABS tablet, Take 1 tablet (5 mg total) by mouth 2 (two) times daily., Disp: 60 tablet, Rfl: 0 .  atorvastatin (LIPITOR) 40 MG tablet, Take 1 tablet (40 mg total) by mouth daily., Disp: 30 tablet, Rfl: 1 .  digoxin (LANOXIN) 0.125 MG tablet, Take 1 tablet (0.125 mg total) by mouth daily., Disp: 30 tablet, Rfl: 1 .  hydrocerin (EUCERIN) CREA, Apply 1 application topically 2 (two) times daily., Disp: 228 g, Rfl: 0 .  losartan (COZAAR) 25 MG tablet, Take 0.5 tablets (12.5 mg total) by mouth daily., Disp: 30 tablet, Rfl: 1 .  spironolactone (ALDACTONE) 25 MG tablet, Take 0.5 tablets (12.5 mg total) by mouth daily., Disp: 15 tablet, Rfl: 1 .  aspirin 81 MG chewable tablet, Chew 1 tablet (81 mg total) by mouth daily. (Patient not taking: Reported on 02/09/2020), Disp:  , Rfl:  .  traMADol (ULTRAM) 50 MG tablet, Take 1 tablet (50 mg total) by mouth every 6 (six) hours as needed for moderate pain., Disp: 28 tablet, Rfl: 0 No Known Allergies    Social History   Socioeconomic History  . Marital status: Single    Spouse name: Not on file  . Number of children: Not on file  . Years of education: Not on file  . Highest education level: Not on file  Occupational History  . Not on file  Tobacco Use  . Smoking status: Never Smoker  . Smokeless tobacco: Never Used  Substance and Sexual Activity  . Alcohol use: Yes  . Drug use: Not  on file  . Sexual activity: Not on file  Other Topics Concern  . Not on file  Social History Narrative  . Not on file   Social Determinants of Health   Financial Resource Strain: Low Risk   . Difficulty of Paying Living Expenses: Not very hard  Food Insecurity: No Food Insecurity  . Worried About Charity fundraiser in the Last Year: Never true  . Ran Out of Food in the Last Year: Never true  Transportation Needs: No Transportation Needs  . Lack of Transportation (Medical): No  . Lack of Transportation (Non-Medical): No  Physical Activity:   . Days of Exercise per Week:   . Minutes of Exercise per Session:   Stress:   . Feeling of Stress :   Social Connections:   . Frequency of Communication with Friends and Family:   . Frequency of Social Gatherings with Friends and Family:   . Attends Religious Services:   . Active Member of Clubs or Organizations:   . Attends Archivist Meetings:   Marland Kitchen Marital Status:   Intimate Partner Violence:   . Fear of Current or Ex-Partner:   . Emotionally Abused:   Marland Kitchen Physically Abused:   . Sexually Abused:     Physical Exam Cardiovascular:  Rate and Rhythm: Normal rate and regular rhythm.     Pulses: Normal pulses.  Pulmonary:     Effort: Pulmonary effort is normal.     Breath sounds: Normal breath sounds.  Musculoskeletal:        General: Normal range of motion.  Skin:    General: Skin is warm and dry.     Capillary Refill: Capillary refill takes less than 2 seconds.  Neurological:     Mental Status: He is alert and oriented to person, place, and time.  Psychiatric:        Mood and Affect: Mood normal.         Future Appointments  Date Time Provider Department Center  02/15/2020 10:00 AM Corliss Skains, MD [facility]  02/16/2020  2:30 PM Cain Saupe, MD [facility] None  02/19/2020  3:30 PM [facility] PA/NP [facility] None    BP 110/72 (BP Location: Right Arm, Patient Position: Sitting, Cuff Size: Normal)    Pulse 83   Resp 18   Wt 227 lb 11.2 oz (103.3 kg)   SpO2 96%   BMI 30.88 kg/m   Weight yesterday- n/a Last visit weight- n/a  Corey Brady was seen at home today for our initial visit following hospital discharge. He reported feeling as well as can be expected given his recent surgery. He denied chest pain, SOB, headache, dizziness or orthopnea since being home. He was given a pillbox today which I filled for him to use over the next week.We spent a majority of the time taking about his diagnosis and medications. Specifically we spoke about why he takes each medication and how it helps with his heart failure. His home is tidy and clean with no obvious trip/fall hazards. He lives with his brother who works second shift but he stated is able to help him with ADLs while he is home. He has a gym set up in his home with a weight bench and cardio equipment set up and he was speaking with Cardiac Rehab before I left. Corey Brady does not have housing or food insecurities and lives off social security income. Corey Brady's medications were verified and he has everything he needs and I will follow up next week.   Jacqualine Code, EMT 02/09/20  ACTION: Home visit completed Next visit planned for 1 week

## 2020-02-13 ENCOUNTER — Telehealth (HOSPITAL_COMMUNITY): Payer: Self-pay | Admitting: *Deleted

## 2020-02-13 NOTE — Telephone Encounter (Signed)
HHRN states she has orders to call pt's weight's to [place], wt today was 227.5 lbs (stable) she also drew bmet today so we should get those results later today or tomorrow.

## 2020-02-15 ENCOUNTER — Ambulatory Visit (INDEPENDENT_AMBULATORY_CARE_PROVIDER_SITE_OTHER): Payer: Self-pay | Admitting: Thoracic Surgery (Cardiothoracic Vascular Surgery)

## 2020-02-15 ENCOUNTER — Other Ambulatory Visit: Payer: Self-pay

## 2020-02-15 ENCOUNTER — Encounter: Payer: Self-pay | Admitting: Thoracic Surgery (Cardiothoracic Vascular Surgery)

## 2020-02-15 VITALS — BP 106/76 | HR 96 | Temp 97.6°F | Resp 20 | Ht 72.0 in | Wt 226.0 lb

## 2020-02-15 DIAGNOSIS — Z951 Presence of aortocoronary bypass graft: Secondary | ICD-10-CM

## 2020-02-15 NOTE — Progress Notes (Signed)
      [address]       [place] [address]             [phone]        Kadeen Sroka [facility] Medical Record #[id number] Date of Birth: 1956/09/09  Referring: Dolores Patty, MD Primary Care: Patient, No Pcp Per Primary Cardiologist:No primary care provider on file.  Reason for visit:   follow-up  History of Present Illness:     Mr. Schneller comes in for his first follow-up appointment.  He is status post coronary artery bypass grafting x1.  He also has a history of congestive heart failure with an EF of 20%.  He has had no complaints since he has been home.  He is not using his pain medication.  Physical Exam: BP 106/76   Pulse 96   Temp 97.6 F (36.4 C) (Skin)   Resp 20   Ht 6' (1.829 m)   Wt 226 lb (102.5 kg)   SpO2 97% Comment: RA  BMI 30.65 kg/m   Alert NAD His incision is clean sternum stable. Irregular heart rhythm Chronic venous stasis changes to both lower extremities.      Assessment / Plan:   Status post CABG x1, with a history of an EF of 20%, postoperative atrial fibrillation/flutter. Currently doing well. He remains digoxin, amiodarone, and Eliquis. He is scheduled to meet with Dr. Delia Heady  on Monday He will follow up with me in 1 month with chest x-ray.   Corliss Skains 02/15/2020 10:17 AM

## 2020-02-16 ENCOUNTER — Encounter: Payer: Self-pay | Admitting: Family Medicine

## 2020-02-16 ENCOUNTER — Other Ambulatory Visit (HOSPITAL_COMMUNITY): Payer: Self-pay

## 2020-02-16 ENCOUNTER — Other Ambulatory Visit: Payer: Self-pay

## 2020-02-16 ENCOUNTER — Ambulatory Visit: Payer: Self-pay | Attending: Family Medicine | Admitting: Family Medicine

## 2020-02-16 VITALS — BP 113/83 | HR 78 | Temp 97.7°F | Ht 72.0 in | Wt 226.4 lb

## 2020-02-16 DIAGNOSIS — Z09 Encounter for follow-up examination after completed treatment for conditions other than malignant neoplasm: Secondary | ICD-10-CM

## 2020-02-16 DIAGNOSIS — L03116 Cellulitis of left lower limb: Secondary | ICD-10-CM

## 2020-02-16 DIAGNOSIS — Z7902 Long term (current) use of antithrombotics/antiplatelets: Secondary | ICD-10-CM

## 2020-02-16 DIAGNOSIS — Z7689 Persons encountering health services in other specified circumstances: Secondary | ICD-10-CM

## 2020-02-16 DIAGNOSIS — L03115 Cellulitis of right lower limb: Secondary | ICD-10-CM

## 2020-02-16 DIAGNOSIS — I5032 Chronic diastolic (congestive) heart failure: Secondary | ICD-10-CM

## 2020-02-16 DIAGNOSIS — D649 Anemia, unspecified: Secondary | ICD-10-CM

## 2020-02-16 DIAGNOSIS — Z8679 Personal history of other diseases of the circulatory system: Secondary | ICD-10-CM

## 2020-02-16 DIAGNOSIS — Z8269 Family history of other diseases of the musculoskeletal system and connective tissue: Secondary | ICD-10-CM

## 2020-02-16 DIAGNOSIS — I2510 Atherosclerotic heart disease of native coronary artery without angina pectoris: Secondary | ICD-10-CM

## 2020-02-16 MED ORDER — CEPHALEXIN 500 MG PO CAPS: 500.0000 mg | ORAL_CAPSULE | Freq: Three times a day (TID) | ORAL | 0 refills | Status: AC

## 2020-02-16 MED ORDER — AMIODARONE HCL 200 MG PO TABS: ORAL_TABLET | ORAL | 1 refills | Status: DC

## 2020-02-16 MED ORDER — LOSARTAN POTASSIUM 25 MG PO TABS: 12.5000 mg | ORAL_TABLET | Freq: Every day | ORAL | 1 refills | Status: DC

## 2020-02-16 MED ORDER — SPIRONOLACTONE 25 MG PO TABS: 12.5000 mg | ORAL_TABLET | Freq: Every day | ORAL | 1 refills | Status: DC

## 2020-02-16 MED ORDER — CEPHALEXIN 500 MG PO CAPS: 500.0000 mg | ORAL_CAPSULE | Freq: Three times a day (TID) | ORAL | 0 refills | Status: DC

## 2020-02-16 MED ORDER — ATORVASTATIN CALCIUM 40 MG PO TABS: 40.0000 mg | ORAL_TABLET | Freq: Every day | ORAL | 3 refills | Status: DC

## 2020-02-16 MED ORDER — DIGOXIN 125 MCG PO TABS: 0.1250 mg | ORAL_TABLET | Freq: Every day | ORAL | 1 refills | Status: DC

## 2020-02-16 MED ORDER — APIXABAN 5 MG PO TABS: 5.0000 mg | ORAL_TABLET | Freq: Two times a day (BID) | ORAL | 3 refills | Status: DC

## 2020-02-16 MED FILL — CEPHALEXIN 500 MG CAPSULE: 500 | 7 days supply | Qty: 21 | Fill #0

## 2020-02-16 NOTE — Progress Notes (Signed)
Patient ID: Corey Brady, male    DOB: August 08, 1956  MRN: [id number]   SUBJECTIVE:  Corey Brady is a 64 y.o. male who presents for hospital f/u. He reports that he had not been hospitalized in the past until late April after gradually having increased shortness of breath, lower extremity swelling, occasional chest pressure as well as increased fatigue for 2 to 3 months prior to hospitalization. He had been told in the past that his blood pressure was elevated but he was never started on medication for his blood pressure as he did not seek any regular medical follow-up. He does not believe that he has seen Dr. as an adult but was told at dental visits that his blood pressure was elevated in the past. He is a retired Presenter, broadcasting and currently lives with his brother. He reports that he has been able to obtain all of his medication status post hospital discharge. He reports no current issues with left-sided chest pain or sensation of palpitations or increased heart rate. He continues to have some shortness of breath but is better than it was prior to hospitalization. He has noticed some recent increase in swelling in his legs. He has weighed himself at home under the instruction of home health nurse. He believes that he has had an increase in his weight. He does remain compliant with use of all of his medications. He denies any unusual bruising or bleeding related to his current use of blood thinning medication. He has had no nosebleeds, no blood in the stool, no black stools, no blood in the urine and he has noticed no blood when brushing his teeth/gums. He will need refills of medications. He is currently uninsured. He also reports that he has a sister who has gout and he would like to have his uric acid level checked as he sometimes has heel pain/right foot pain and would like to know if he might have gout as well.   Where: [facility] When:01/23/2020-02/08/2020 Primary Dx: Single-vessel CAD S/P  CABG; acute heart failure; history of atrial fibrillation and atrial flutter during hospitalization status post cardioversion   Patient Active Problem List   Diagnosis Date Noted  . S/P CABG (coronary artery bypass graft) 01/30/2020  . Acute heart failure ([place]) 01/23/2020     Current Outpatient Medications on File Prior to Visit  Medication Sig Dispense Refill  . amiodarone (PACERONE) 200 MG tablet Take 2 tablets (400 mg total) by mouth 2 (two) times daily. For 7 days, then 200 mg BID for 7 days, then 200 mg daily 120 tablet 1  . apixaban (ELIQUIS) 5 MG TABS tablet Take 1 tablet (5 mg total) by mouth 2 (two) times daily. 60 tablet 0  . aspirin 81 MG chewable tablet Chew 1 tablet (81 mg total) by mouth daily.    Marland Kitchen atorvastatin (LIPITOR) 40 MG tablet Take 1 tablet (40 mg total) by mouth daily. 30 tablet 1  . digoxin (LANOXIN) 0.125 MG tablet Take 1 tablet (0.125 mg total) by mouth daily. 30 tablet 1  . hydrocerin (EUCERIN) CREA Apply 1 application topically 2 (two) times daily. 228 g 0  . losartan (COZAAR) 25 MG tablet Take 0.5 tablets (12.5 mg total) by mouth daily. 30 tablet 1  . spironolactone (ALDACTONE) 25 MG tablet Take 0.5 tablets (12.5 mg total) by mouth daily. 15 tablet 1  . traMADol (ULTRAM) 50 MG tablet Take 1 tablet (50 mg total) by mouth every 6 (six) hours as needed for moderate  pain. (Patient not taking: Reported on 02/16/2020) 28 tablet 0   No current facility-administered medications on file prior to visit.    No Known Allergies  Social History   Tobacco Use  . Smoking status: Never Smoker  . Smokeless tobacco: Never Used  Substance Use Topics  . Alcohol use: Yes  . Drug use: Not on file     No family history on file.  Past Surgical History:  Procedure Laterality Date  . CARDIOVERSION N/A 02/06/2020   Procedure: CARDIOVERSION;  Surgeon: Dolores Patty, MD;  Location: [facility] ENDOSCOPY;  Service: Cardiovascular;  Laterality: N/A;  . CORONARY ARTERY BYPASS GRAFT  N/A 01/30/2020   Procedure: OFF PUMP CORONARY ARTERY BYPASS GRAFTING (CABG) TIMES ONE;  Surgeon: Corliss Skains, MD;  Location: [facility] OR;  Service: Open Heart Surgery;  Laterality: N/A;  swan only  . RIGHT/LEFT HEART CATH AND CORONARY ANGIOGRAPHY N/A 01/26/2020   Procedure: RIGHT/LEFT HEART CATH AND CORONARY ANGIOGRAPHY;  Surgeon: Dolores Patty, MD;  Location: [facility] INVASIVE CV LAB;  Service: Cardiovascular;  Laterality: N/A;  . TEE WITHOUT CARDIOVERSION N/A 01/30/2020   Procedure: TRANSESOPHAGEAL ECHOCARDIOGRAM (TEE);  Surgeon: Corliss Skains, MD;  [facility] OR;  Service: Open Heart Surgery;  Laterality: N/A;  . TEE WITHOUT CARDIOVERSION N/A 02/06/2020   Procedure: TRANSESOPHAGEAL ECHOCARDIOGRAM (TEE);  Surgeon: Dolores Patty, MD;  Location: [facility] ENDOSCOPY;  Service: Cardiovascular;  Laterality: N/A;    ROS: Review of Systems  Constitutional: Positive for fatigue. Negative for chills, fever and unexpected weight change.  HENT: Negative for sore throat and trouble swallowing.   Respiratory: Positive for shortness of breath. Negative for cough.   Cardiovascular: Positive for leg swelling. Negative for chest pain and palpitations.  Gastrointestinal: Negative for abdominal pain, blood in stool, constipation, diarrhea and nausea.  Endocrine: Negative for polydipsia, polyphagia and polyuria.  Genitourinary: Negative for dysuria, flank pain and frequency.  Musculoskeletal: Positive for arthralgias (Occasional), back pain (Stable, chronic) and gait problem (Due to leg swelling).  Neurological: Negative for dizziness and headaches.  Hematological: Negative for adenopathy. Does not bruise/bleed easily.  Psychiatric/Behavioral: Negative for self-injury and suicidal ideas.     PHYSICAL EXAM: BP 113/83   Pulse 78   Temp 97.7 F (36.5 C) (Temporal)   Ht 6' (1.829 m)   Wt 226 lb 6.4 oz (102.7 kg)   SpO2 96%   BMI 30.71 kg/m    Physical Exam Vitals and nursing note reviewed.    Constitutional:      General: He is not in acute distress.    Appearance: He is obese.     Comments: Well-nourished well-developed obese older male in no acute distress but patient with visible bilateral lower extremity and pedal edema as well as increased redness to the bilateral lower extremities. Patient is sitting on a chair in the exam room. He is wearing a mask as per office COVID-19 precautions.  Neck:     Comments: No JVD Cardiovascular:     Rate and Rhythm: Normal rate and regular rhythm.     Comments: Slightly distant heart sounds secondary to body habitus. Dorsalis pedis and posterior tibial pulses are difficult to palpate, likely partially due to pedal edema/lower extremity edema Pulmonary:     Effort: Pulmonary effort is normal.     Breath sounds: Normal breath sounds. No rales.     Comments: No increased work of breathing. No accessory muscle use. Patient with mild decrease in breath sounds at the lung bases Abdominal:  Palpations: Abdomen is soft.     Tenderness: There is no abdominal tenderness. There is no right CVA tenderness, left CVA tenderness, guarding or rebound.     Comments: Truncal obesity  Musculoskeletal:        General: Tenderness (Mild lumbosacral discomfort to palpation) present.     Cervical back: Normal range of motion and neck supple.     Right lower leg: Edema present.     Left lower leg: Edema present.  Lymphadenopathy:     Cervical: No cervical adenopathy.  Skin:    Findings: Erythema present.     Comments: Bilateral lower extremity edema with chronic venous stasis skin changes. Patient also with increased warmth and deepening of erythema over the bilateral anterior shins. No tenderness with calf squeeze  Neurological:     General: No focal deficit present.     Mental Status: He is alert and oriented to person, place, and time.  Psychiatric:        Mood and Affect: Mood normal.        Behavior: Behavior normal.       ASSESSMENT AND  PLAN: 1. Encounter to establish care 2. Hospital discharge follow-up 3. Coronary artery disease involving native coronary artery of native heart without angina pectoris; 4. Chronic diastolic heart failure He reports that prior to recent hospitalization in late April he had never really had consistent medical follow-up as he felt that he had been otherwise healthy. A few months hospitalization, patient  started having an increase in swelling of his lower legs, increased shortness of breath, and becoming more fatigued.   Patient was admitted on started on treatment for acute heart failure with ejection fraction of 20% or less on echo and because patient also had some complaints of intermittent chest pressure prior to his hospitalization,  heart catheterization done which showed single-vessel coronary artery disease of LAD for which patient had surgical intervention. He developed postsurgical atrial fibrillation and flutter for which he was placed on IV amiodarone due to increased heart rate and later cardioverted. He is currently on amiodarone and digoxin. Patient has all of his blood pressure medication bottles with him at today's visit which were reviewed and discussed with the patient. Refills provided. He will have comprehensive metabolic panel in follow-up of use of medications for treatment of CHF, hyperlipidemia and CAD as well as CBC in follow-up of mild anemia and use of antithrombotic medication. He has upcoming appointment with cardiology. - Comprehensive metabolic panel - CBC - atorvastatin (LIPITOR) 40 MG tablet; Take 1 tablet (40 mg total) by mouth daily. (Patient taking differently: Take 40 mg by mouth at bedtime. )  Dispense: 90 tablet; Refill: 3 - apixaban (ELIQUIS) 5 MG TABS tablet; Take 1 tablet (5 mg total) by mouth 2 (two) times daily.  Dispense: 180 tablet; Refill: 3 - losartan (COZAAR) 25 MG tablet; Take 0.5 tablets (12.5 mg total) by mouth daily.  Dispense: 45 tablet; Refill: 1  5.  Cellulitis of both lower extremities At today's visit, patient with increased lower extremity edema bilaterally along with increased redness and increased warmth. Prescription provided for Keflex 500 mg 3 times daily x7 days and he is aware that if he has acute worsening of redness, swelling or pain that he should seek attention in the emergency department. Patient's cardiologist will also be contacted as patient has upcoming appointment next week and cellulitis can be reevaluated at that time.  - cephALEXin (KEFLEX) 500 MG capsule; Take 1 capsule (500 mg total) by  mouth 3 (three) times daily for 7 days.  Dispense: 21 capsule; Refill: 0  6. History of atrial flutter Patient with atrial flutter and atrial fibrillation during while in the hospital after coronary artery bypass graft surgery. He was started on amiodarone IV but continued to have elevated heart rate. On 02/06/2020 patient underwent cardioversion and patient was able to be switched to p.o. amiodarone while remaining in normal sinus rhythm with PACs. Prescription was provided for Eliquis so that this can be ordered through patient assistance program however patient is also to discuss with his heart specialist how long he will remain on Eliquis as there is mention in his discharge summary that since patient is a Jehovah's Witness that Eliquis may be discontinued to help reduce the risk of bleeding if patient has remained in sinus rhythm for at least 4 weeks status post his cardioversion. Patient has upcoming appointment with cardiology. - digoxin (LANOXIN) 0.125 MG tablet; Take 1 tablet (0.125 mg total) by mouth daily.  Dispense: 90 tablet; Refill: 1 - apixaban (ELIQUIS) 5 MG TABS tablet; Take 1 tablet (5 mg total) by mouth 2 (two) times daily.  Dispense: 180 tablet; Refill: 3  7. Mild anemia 8. Long term current use of antithrombotics/antiplatelets Patient with mild anemia during hospitalization and is currently on antithrombotic/antiplatelet  medication. He will have CBC done in follow-up. He denies any unusual bruising or bleeding at this time. Eliquis was refilled but patient was told to follow-up with his cardiologist regarding how long he will remain on this medication as there is mention in patient's visit note that because patient is a Jehovah's Witness that his Eliquis may be discontinued in approximately 4 weeks if patient is able to maintain sinus rhythm. - CBC  9. Family history of gout He has a family history of sister with gout and since he has occasional heel pain he would like to have his uric acid level checked to see if he may have gout as well.  - Uric Acid  Future Appointments  Date Time Provider Department Center  02/19/2020  3:30 PM [facility] PA/NP [facility] None  03/22/2020  9:45 AM Lightfoot, Eliezer Lofts, MD [facility]   An After Visit Summary was printed and given to the patient.  Patient's cardiology office was contacted as patient with upcoming appointment and with new diagnosis of cellulitis of the LE's at today's visit and patient with lower extremity edema that has likely worsened status post hospitalization  More than 35 minutes of face-to-face time was spent with the patient at today's visit obtaining recent and current medical history, reviewing recent hospitalization and treatment, obtaining review of systems, performing physical examination and discussing assessment and treatment plan with the patient and answering all questions to the patient's satisfaction. An additional 15 minutes was spent on pre and post visit review of chart and completion of today's visit note.  Cain Saupe, MD, Jerrel Ivory
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
## 2020-02-16 NOTE — Progress Notes (Signed)
HFU 

## 2020-02-17 LAB — CBC
Hematocrit: 38.7 % (ref 37.5–51.0)
Hemoglobin: 13.1 g/dL (ref 13.0–17.7)
MCH: 30.3 pg (ref 26.6–33.0)
MCHC: 33.9 g/dL (ref 31.5–35.7)
MCV: 89 fL (ref 79–97)
Platelets: 349 x10E3/uL (ref 150–450)
RBC: 4.33 x10E6/uL (ref 4.14–5.80)
RDW: 14.3 % (ref 11.6–15.4)
WBC: 7.7 x10E3/uL (ref 3.4–10.8)

## 2020-02-17 LAB — COMPREHENSIVE METABOLIC PANEL WITH GFR
ALT: 16 IU/L (ref 0–44)
AST: 22 IU/L (ref 0–40)
Albumin/Globulin Ratio: 1.2 (ref 1.2–2.2)
Albumin: 4.1 g/dL (ref 3.8–4.8)
Alkaline Phosphatase: 147 IU/L — ABNORMAL HIGH (ref 48–121)
BUN/Creatinine Ratio: 18 (ref 10–24)
BUN: 19 mg/dL (ref 8–27)
Bilirubin Total: 0.6 mg/dL (ref 0.0–1.2)
CO2: 26 mmol/L (ref 20–29)
Calcium: 9.6 mg/dL (ref 8.6–10.2)
Chloride: 101 mmol/L (ref 96–106)
Creatinine, Ser: 1.04 mg/dL (ref 0.76–1.27)
GFR calc Af Amer: 88 mL/min/1.73
GFR calc non Af Amer: 76 mL/min/1.73
Globulin, Total: 3.3 g/dL (ref 1.5–4.5)
Glucose: 67 mg/dL (ref 65–99)
Potassium: 5.7 mmol/L — ABNORMAL HIGH (ref 3.5–5.2)
Sodium: 140 mmol/L (ref 134–144)
Total Protein: 7.4 g/dL (ref 6.0–8.5)

## 2020-02-17 LAB — URIC ACID: Uric Acid: 5.1 mg/dL (ref 3.8–8.4)

## 2020-02-19 ENCOUNTER — Encounter (HOSPITAL_COMMUNITY): Payer: Self-pay

## 2020-02-19 ENCOUNTER — Ambulatory Visit (HOSPITAL_COMMUNITY)
Admission: RE | Admit: 2020-02-19 | Discharge: 2020-02-19 | Disposition: A | Discharge status: Home / Self Care | Payer: Self-pay | Source: Ambulatory Visit | Attending: Cardiology | Admitting: Cardiology

## 2020-02-19 ENCOUNTER — Other Ambulatory Visit: Payer: Self-pay

## 2020-02-19 VITALS — BP 136/86 | HR 85 | Wt 225.0 lb

## 2020-02-19 DIAGNOSIS — I4892 Unspecified atrial flutter: Secondary | ICD-10-CM | POA: Insufficient documentation

## 2020-02-19 DIAGNOSIS — Z7982 Long term (current) use of aspirin: Secondary | ICD-10-CM | POA: Insufficient documentation

## 2020-02-19 DIAGNOSIS — I5022 Chronic systolic (congestive) heart failure: Secondary | ICD-10-CM | POA: Insufficient documentation

## 2020-02-19 DIAGNOSIS — I2510 Atherosclerotic heart disease of native coronary artery without angina pectoris: Secondary | ICD-10-CM | POA: Insufficient documentation

## 2020-02-19 DIAGNOSIS — Z951 Presence of aortocoronary bypass graft: Secondary | ICD-10-CM | POA: Insufficient documentation

## 2020-02-19 DIAGNOSIS — Z7901 Long term (current) use of anticoagulants: Secondary | ICD-10-CM | POA: Insufficient documentation

## 2020-02-19 DIAGNOSIS — Z79899 Other long term (current) drug therapy: Secondary | ICD-10-CM | POA: Insufficient documentation

## 2020-02-19 DIAGNOSIS — L03116 Cellulitis of left lower limb: Secondary | ICD-10-CM | POA: Insufficient documentation

## 2020-02-19 DIAGNOSIS — L03115 Cellulitis of right lower limb: Secondary | ICD-10-CM | POA: Insufficient documentation

## 2020-02-19 LAB — CBC
HCT: 37.7 % — ABNORMAL LOW (ref 39.0–52.0)
Hemoglobin: 12.1 g/dL — ABNORMAL LOW (ref 13.0–17.0)
MCH: 29.7 pg (ref 26.0–34.0)
MCHC: 32.1 g/dL (ref 30.0–36.0)
MCV: 92.4 fL (ref 80.0–100.0)
Platelets: 305 10*3/uL (ref 150–400)
RBC: 4.08 MIL/uL — ABNORMAL LOW (ref 4.22–5.81)
RDW: 14.2 % (ref 11.5–15.5)
WBC: 5.5 10*3/uL (ref 4.0–10.5)
nRBC: 0 % (ref 0.0–0.2)

## 2020-02-19 LAB — BASIC METABOLIC PANEL
Anion gap: 9 (ref 5–15)
BUN: 21 mg/dL (ref 8–23)
CO2: 27 mmol/L (ref 22–32)
Calcium: 8.9 mg/dL (ref 8.9–10.3)
Chloride: 103 mmol/L (ref 98–111)
Creatinine, Ser: 0.91 mg/dL (ref 0.61–1.24)
GFR calc Af Amer: >60 mL/min (ref >60)
GFR calc non Af Amer: >60 mL/min (ref >60)
Glucose, Bld: 119 mg/dL — ABNORMAL HIGH (ref 70–99)
Potassium: 4.3 mmol/L (ref 3.5–5.1)
Sodium: 139 mmol/L (ref 135–145)

## 2020-02-19 LAB — DIGOXIN LEVEL: Digoxin Level: 0.5 ng/mL — ABNORMAL LOW (ref 0.8–2.0)

## 2020-02-19 LAB — MAGNESIUM: Magnesium: 1.9 mg/dL (ref 1.7–2.4)

## 2020-02-19 MED ORDER — AMIODARONE HCL 200 MG PO TABS: ORAL_TABLET | ORAL | 1 refills | Status: DC

## 2020-02-19 MED ORDER — FUROSEMIDE 20 MG PO TABS
20.0000 mg | ORAL_TABLET | Freq: Every day | ORAL | 3 refills | Status: DC
Start: 2020-02-19 — End: 2020-03-18

## 2020-02-19 NOTE — Progress Notes (Signed)
Advanced Heart Failure Clinic Note   Referring Physician: PCP: Patient, No Pcp Per PCP-Cardiologist: [name]   Reason for Visit: [facility] F/u for CAD, Systolic Heart Failure and Atrial Flutter, s/p CABG   HPI:   64 y/o male, recently admitted to [facility] 01/23/20 w/ 3 month h/o progressive dyspnea, LEE, orthopnea and PND and diagnosed w/ acute CHF w/ massive volume overload/ anasarca, also in the setting of elevated BP. Diuresed and started on HF/CHF meds. Echo showed severely reduced LVEF 15% with severe global HK  Moderate RV dysfunction, leading to [facility] which showed Severe 1v CAD with ostially occlusion of LAD with prominent R->L collaterals and good target and well preserved hemodynamics. TCTS was consulted and he underwent CABG x 1 w/ LIMA- LAD. Require milrinone post operatively. Weaned off w/ stable co-ox. Post op course also c/b atrial flutter requiring amiodarone + TEE guided DCCV back to NSR. 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   He presents to clinic today for post hospital f/u. He was referred to paramedicine post discharge to help w/ meds. He is here alone. He lives w/ his brother. Reports compliance w/ medications. No side effects. He denies CP and no significant dyspnea, at rest or w/ exertionl. Also denies orthopnea/PND. He does however have 1+ bilateral LEE and bilateral erythema, R>L c/w cellulitis. No fever or chills. He saw his PCP last week who also had concerns for cellulitis and started Keflex, which he notes compliance. Also of noted, BMP was checked at PCP office last week on 5/21 and K was mildly elevated at 5.7.   EKG unfortunately shows that he is back in atrial flutter w/ CVR and largely unaware. He reports full compliance w/ Eliquis since discharge. No missed doses. He denies abnormal bleeding    Echo (pre CABG) 4/21: LVEF <20%. GIII. RV  systolic function moderately reduced.    Review of Systems: [y] = yes, [ ]  = no   General: Weight gain [ ] ; Weight loss [ ] ; Anorexia [ ] ; Fatigue [ ] ; Fever [ ] ; Chills [ ] ; Weakness [ ]   Cardiac: Chest pain/pressure [ ] ; Resting SOB [ ] ; Exertional SOB [ ] ; Orthopnea [ ] ; Pedal Edema [ ] ; Palpitations [ ] ; Syncope [ ] ; Presyncope [ ] ; Paroxysmal nocturnal dyspnea[ ]   Pulmonary: Cough [ ] ; Wheezing[ ] ; Hemoptysis[ ] ; Sputum [ ] ; Snoring [ ]   GI: Vomiting[ ] ; Dysphagia[ ] ; Melena[ ] ; Hematochezia [ ] ; Heartburn[ ] ; Abdominal pain [ ] ; Constipation [ ] ; Diarrhea [ ] ; BRBPR [ ]   GU: Hematuria[ ] ; Dysuria [ ] ; Nocturia[ ]   Vascular: Pain in legs with walking [ ] ; Pain in feet with lying flat [ ] ; Non-healing sores [ ] ; Stroke [ ] ; TIA [ ] ; Slurred speech [ ] ;  Neuro: Headaches[ ] ; Vertigo[ ] ; Seizures[ ] ; Paresthesias[ ] ;Blurred vision [ ] ; Diplopia [ ] ; Vision changes [ ]   Ortho/Skin: Arthritis [ ] ; Joint pain [ ] ; Muscle pain [ ] ; Joint swelling [ ] ; Back Pain [ ] ; Rash [ ]   Psych: Depression[ ] ; Anxiety[ ]   Heme: Bleeding problems [ ] ; Clotting disorders [ ] ; Anemia [ ]   Endocrine: Diabetes [ ] ; Thyroid dysfunction[ ]    Past Medical History:  Diagnosis Date  . Gout   . No blood products 01/27/2020    Current Outpatient Medications  Medication Sig Dispense Refill  . amiodarone (PACERONE) 200 MG  tablet One tablet once per day 90 tablet 1  . apixaban (ELIQUIS) 5 MG TABS tablet Take 1 tablet (5 mg total) by mouth 2 (two) times daily. 180 tablet 3  . aspirin 81 MG chewable tablet Chew 1 tablet (81 mg total) by mouth daily.    Marland Kitchen atorvastatin (LIPITOR) 40 MG tablet Take 1 tablet (40 mg total) by mouth daily. 90 tablet 3  . cephALEXin (KEFLEX) 500 MG capsule Take 1 capsule (500 mg total) by mouth 3 (three) times daily for 7 days. 21 capsule 0  . digoxin (LANOXIN) 0.125 MG tablet Take 1 tablet (0.125 mg total) by mouth daily. 90 tablet 1  . hydrocerin (EUCERIN) CREA Apply 1 application  topically 2 (two) times daily. 228 g 0  . losartan (COZAAR) 25 MG tablet Take 0.5 tablets (12.5 mg total) by mouth daily. 45 tablet 1  . spironolactone (ALDACTONE) 25 MG tablet Take 0.5 tablets (12.5 mg total) by mouth daily. 45 tablet 1  . traMADol (ULTRAM) 50 MG tablet Take 1 tablet (50 mg total) by mouth every 6 (six) hours as needed for moderate pain. 28 tablet 0   No current facility-administered medications for this encounter.    No Known Allergies    Social History   Socioeconomic History  . Marital status: Single    Spouse name: Not on file  . Number of children: Not on file  . Years of education: Not on file  . Highest education level: Not on file  Occupational History  . Not on file  Tobacco Use  . Smoking status: Never Smoker  . Smokeless tobacco: Never Used  Substance and Sexual Activity  . Alcohol use: Yes  . Drug use: Not on file  . Sexual activity: Not on file  Other Topics Concern  . Not on file  Social History Narrative  . Not on file   Social Determinants of Health   Financial Resource Strain: Low Risk   . Difficulty of Paying Living Expenses: Not very hard  Food Insecurity: No Food Insecurity  . Worried About Programme researcher, broadcasting/film/video in the Last Year: Never true  . Ran Out of Food in the Last Year: Never true  Transportation Needs: No Transportation Needs  . Lack of Transportation (Medical): No  . Lack of Transportation (Non-Medical): No  Physical Activity:   . Days of Exercise per Week:   . Minutes of Exercise per Session:   Stress:   . Feeling of Stress :   Social Connections:   . Frequency of Communication with Friends and Family:   . Frequency of Social Gatherings with Friends and Family:   . Attends Religious Services:   . Active Member of Clubs or Organizations:   . Attends Banker Meetings:   Marland Kitchen Marital Status:   Intimate Partner Violence:   . Fear of Current or Ex-Partner:   . Emotionally Abused:   Marland Kitchen Physically Abused:   .  Sexually Abused:       Family History  Problem Relation Age of Onset  . Gout Sister     Vitals:   02/19/20 1546  BP: 136/86  Pulse: 85  SpO2: 97%  Weight: 102.1 kg (225 lb)     PHYSICAL EXAM: General:  Well appearing. No respiratory difficulty HEENT: normal Neck: supple. no JVD. Carotids 2+ bilat; no bruits. No lymphadenopathy or thyromegaly appreciated. Cor: PMI nondisplaced. Irregular rhythm, regular rate. No rubs, gallops or murmurs. Lungs: clear, no wheezing  Abdomen: soft, nontender, nondistended.  No hepatosplenomegaly. No bruits or masses. Good bowel sounds. Extremities: no cyanosis, clubbing, rash, 1+ bilateral LEE w/ bilateral erythema, R>L appearance c/w cellulitis  Neuro: alert & oriented x 3, cranial nerves grossly intact. moves all 4 extremities w/o difficulty. Affect pleasant.  ECG: Atrial Flutter w/ variable ventricular response. 77 bpm    ASSESSMENT & PLAN:  1. Chronic Systolic Heart Failure - ECHO EF 15% with severe global HK, and moderate RV dysfunction.  Etiology possible ETOH versus HTN (denies severe ETOH use). TSH was ok. HIV NR  - 4/28 LHC with severe 1v CAD. RHC with preserved cardiac output.  - S/p CABG x 1 w/ LIMA-LAD 5/4 - NYHA Class II-III - Volume overloaded on exam.  - Add Lasix 20 mg daily  - For now, continue spiro 12.5 and losartan 12.5. Hold off on titration until repeat BMP (BMP 02/16/20 showed mild hyperkalemia at 5.7. ?If hemolyzed. If normal K on repeat labs, will increase spiro to 25 mg daily and try to change losartan to [facility] on return visit. Plan to send home w/ samples of Veltassa, in the event that repeat labs show persistent hyperkalemia. Will advise if need to use.   - continue digoxin 0.125 mg. Check Dig level today  - try to add  blocker at next visit, once better diuresed - continue w/ paramedicine to help w/ compliance.  -Will try to gradually titrate HF meds q2-3 weeks. Will need repeat 2D echo after 3 months of optimal  medical therapy. Refer to EP for possible ICD if EF remains < 35%.    2. CAD:  - LHC 12/2019 with severe 1VCAD - S/p CABG x 1 w/ LIMA-LAD 01/30/20 - stable w/o CP  - on ASA 81 + statin, atorvastatin 40 mg  3. LE Cellulits, Bilateral R>L   - Seen by PCP 5/21 and Keflex started - denies fever and chills but will check CBC   4. Atrial Flutter  - had post operative Afib/Flutter post CABG s/p TEE/DCCV - back in Aflutter today by EKG w/ CVR, suspect contributing to volume overload - He reports full compliance w/ Eliquis w/o interruption (TEE during hospitalization showed no LA thrombus) - Will increase amiodarone to 200 mg bid in an effort to chemically convert - He was advised to continue strict compliance w/ Eliquis - Will arrange outpatient DCCV  - Check CBC, BMP and Mg today    F/u 1-2 weeks post DCCV for repeat EKG and further optimization of HF meds.   Lyda Jester, PA-C 02/19/20

## 2020-02-19 NOTE — Patient Instructions (Signed)
Start Furosemide 20 mg daily  Increase Amiodarone to 200 mg Twice daily FOR 10 DAYS ONLY, then decrease back to 200 mg Daily  Labs done today, we will notify you for abnormal results  Your physician has recommended that you have a Cardioversion (DCCV). Electrical Cardioversion uses a jolt of electricity to your heart either through paddles or wired patches attached to your chest. This is a controlled, usually prescheduled, procedure. Defibrillation is done under light anesthesia in the hospital, and you usually go home the day of the procedure. This is done to get your heart back into a normal rhythm. You are not awake for the procedure. Please see the instruction sheet given to you today.  Your physician recommends that you schedule a follow-up appointment in: 3 weeks  If you have any questions or concerns before your next appointment please send [place] a message through [place] or call our office at [phone].  At the Advanced [facility] Failure Clinic, you and your health needs are our priority. As part of our continuing mission to provide you with exceptional heart care, we have created designated Provider Care Teams. These Care Teams include your primary Cardiologist (physician) and Advanced Practice Providers (APPs- Physician Assistants and Nurse Practitioners) who all work together to provide you with the care you need, when you need it.   You may see any of the following providers on your designated Care Team at your next follow up: Marland Kitchen Dr Arvilla Meres . Dr Marca Ancona . Tonye Becket, NP . Robbie Lis, PA . Karle Plumber, PharmD   Please be sure to bring in all your medications bottles to every appointment.     CARDIOVERSION INSTRUCTIONS: **I WILL CALL YOU TOMORROW WITH DATE AND TIME**  You are scheduled for a Cardioversion on TBD with [name].  Please arrive at the [facility] (Main Entrance A) at [facility]: [address] [place], [place] [address] at TBD am/pm.    DIET: Nothing to eat or drink after midnight except a sip of water with medications (see medication instructions below)  Medication Instructions: Hold FUROSEMIDE AND SPIRONOLACTONE  Continue your anticoagulant: ELIQUIS You will need to continue your anticoagulant after your procedure until you  are told by your  Provider that it is safe to stop   COVID TEST: NEEDED THIS WEEK, I WILL CALL YOU TOMORROW WITH DATE AND TIME   You must have a responsible person to drive you home and stay in the waiting area during your procedure. Failure to do so could result in cancellation.  Bring your insurance cards.  *Special Note: Every effort is made to have your procedure done on time. Occasionally there are emergencies that occur at the hospital that may cause delays. Please be patient if a delay does occur.

## 2020-02-20 ENCOUNTER — Telehealth (HOSPITAL_COMMUNITY): Payer: Self-pay | Admitting: *Deleted

## 2020-02-20 ENCOUNTER — Telehealth (HOSPITAL_COMMUNITY): Payer: Self-pay | Admitting: Pharmacist

## 2020-02-20 ENCOUNTER — Encounter (HOSPITAL_COMMUNITY): Payer: Self-pay | Admitting: *Deleted

## 2020-02-20 ENCOUNTER — Other Ambulatory Visit (HOSPITAL_COMMUNITY): Payer: Self-pay

## 2020-02-20 ENCOUNTER — Other Ambulatory Visit (HOSPITAL_COMMUNITY): Payer: Self-pay | Admitting: *Deleted

## 2020-02-20 DIAGNOSIS — I4892 Unspecified atrial flutter: Secondary | ICD-10-CM

## 2020-02-20 DIAGNOSIS — I2510 Atherosclerotic heart disease of native coronary artery without angina pectoris: Secondary | ICD-10-CM

## 2020-02-20 DIAGNOSIS — I5032 Chronic diastolic (congestive) heart failure: Secondary | ICD-10-CM

## 2020-02-20 LAB — ECHO INTRAOPERATIVE TEE
AV Mean grad: 1 mmHg
Ao-asc: 3.2 cm
Height: 72 in
Mean grad: 0 mmHg
Reg vol: 0.3 cc
Sinus: 3.4 cm
Weight: 3504 oz

## 2020-02-20 MED ORDER — AMIODARONE HCL 200 MG PO TABS: ORAL_TABLET | ORAL | 1 refills | Status: DC

## 2020-02-20 MED ORDER — SPIRONOLACTONE 25 MG PO TABS: 25.0000 mg | ORAL_TABLET | Freq: Every day | ORAL | 1 refills | Status: DC

## 2020-02-20 MED FILL — FUROSEMIDE 20 MG TABS: 20 | 34 days supply | Qty: 34 | Fill #0

## 2020-02-20 MED FILL — AMIODARONE HCL 200 MG TAB: 200 | 29 days supply | Qty: 68 | Fill #0

## 2020-02-20 MED FILL — SPIRONOLACTONE 25 MG TABS: 25 | 34 days supply | Qty: 34 | Fill #0

## 2020-02-20 NOTE — Telephone Encounter (Signed)
Per Timothy Lasso, pt has been taking Amio 200 mg BID, per Brittainy increase to 400 mg BID until dccv then back to 200 mg BID, Zack is aware of all med changes, med list updated and prescriptions for Losartan, Furosemide, Spironolactone, Digoxin, Atorvastatin and Amio all sent to [facility] under HF FUND, Timothy Lasso will pick up and take to patient, adjust pill box and review dccv instructions with pt again

## 2020-02-20 NOTE — Progress Notes (Signed)
Corey Brady was seen at home today to amend his pillbox following his appointment yesterday. Per the HF clinic staff, he was increased to 400 mg BID for the next 5 days and then return to 200 mg BID. He has a cardioversion scheduled for Friday so I gave him the paperwork for his COVID-19 test and directions to the procedure on Friday. Lastly I obtained the required income information and his signature to submit for patient assistance for Eliquis. I will follow up following his procedure on Friday.   Jacqualine Code, EMT 02/20/20

## 2020-02-20 NOTE — Progress Notes (Signed)
dccv sch for 5/28 at 8:30 AM, instruction sheet updated with date/time/covid appt, Zack with paramedicine will take to pt today and review with him.

## 2020-02-20 NOTE — Telephone Encounter (Signed)
-----   Message from Allayne Butcher, [place] sent at 02/19/2020  5:21 PM EDT ----- Labs stable. K normal. Suspect lab value on 5/21 was error due to hemolysis. Increase Spiro to 25 mg daily . Repeat BMP in 1 week.  Wynn Maudlin. I also increased amiodarone to 200 mg bid today and added Lasix 20 mg daily.

## 2020-02-20 NOTE — Telephone Encounter (Signed)
Med changes and updated med list emailed to [email or web address] per [facility].

## 2020-02-20 NOTE — Telephone Encounter (Signed)
Sent in Manufacturer's Assistance application to [employer] for Eliquis.    Application pending, will continue to follow.  Siddalee Vanderheiden, PharmD, BCPS, BCCP, CPP Heart Failure Clinic Pharmacist [phone]   

## 2020-02-21 ENCOUNTER — Other Ambulatory Visit (HOSPITAL_COMMUNITY)
Admission: RE | Admit: 2020-02-21 | Discharge: 2020-02-21 | Disposition: A | Discharge status: Home / Self Care | Payer: Self-pay | Source: Ambulatory Visit | Attending: Internal Medicine | Admitting: Internal Medicine

## 2020-02-21 DIAGNOSIS — Z01812 Encounter for preprocedural laboratory examination: Secondary | ICD-10-CM | POA: Insufficient documentation

## 2020-02-21 DIAGNOSIS — Z20822 Contact with and (suspected) exposure to covid-19: Secondary | ICD-10-CM | POA: Insufficient documentation

## 2020-02-21 LAB — SARS CORONAVIRUS 2 (TAT 6-24 HRS): SARS Coronavirus 2: NEGATIVE

## 2020-02-23 ENCOUNTER — Encounter (HOSPITAL_COMMUNITY): Payer: Self-pay | Admitting: Anesthesiology

## 2020-02-23 ENCOUNTER — Encounter (HOSPITAL_COMMUNITY)
Admission: RE | Disposition: A | Discharge status: Home / Self Care | Payer: Self-pay | Source: Home / Self Care | Attending: Internal Medicine

## 2020-02-23 ENCOUNTER — Encounter (HOSPITAL_COMMUNITY): Payer: Self-pay | Admitting: Internal Medicine

## 2020-02-23 ENCOUNTER — Other Ambulatory Visit (HOSPITAL_COMMUNITY): Payer: Self-pay

## 2020-02-23 ENCOUNTER — Other Ambulatory Visit: Payer: Self-pay

## 2020-02-23 ENCOUNTER — Ambulatory Visit (HOSPITAL_COMMUNITY)
Admission: RE | Admit: 2020-02-23 | Discharge: 2020-02-23 | Disposition: A | Discharge status: Home / Self Care | Payer: Self-pay | Attending: Internal Medicine | Admitting: Internal Medicine

## 2020-02-23 DIAGNOSIS — I4892 Unspecified atrial flutter: Secondary | ICD-10-CM | POA: Insufficient documentation

## 2020-02-23 SURGERY — CANCELLED PROCEDURE

## 2020-02-23 NOTE — Anesthesia Preprocedure Evaluation (Deleted)
Anesthesia Evaluation    Reviewed: Allergy & Precautions, Patient's Chart, lab work & pertinent test results  Airway        Dental   Pulmonary neg pulmonary ROS,           Cardiovascular hypertension, Pt. on medications + CAD and + CABG  + dysrhythmias Atrial Fibrillation      Neuro/Psych negative neurological ROS     GI/Hepatic negative GI ROS, Neg liver ROS,   Endo/Other  negative endocrine ROS  Renal/GU negative Renal ROS     Musculoskeletal Gout   Abdominal (+) + obese,   Peds  Hematology  (+) anemia , REFUSES BLOOD PRODUCTS, HLD   Anesthesia Other Findings A-fib  Reproductive/Obstetrics                             Anesthesia Physical Anesthesia Plan  ASA: III  Anesthesia Plan: General   Post-op Pain Management:    Induction: Intravenous  PONV Risk Score and Plan: 2 and Propofol infusion and Treatment may vary due to age or medical condition  Airway Management Planned: Mask  Additional Equipment:   Intra-op Plan:   Post-operative Plan:   Informed Consent:   Plan Discussed with:   Anesthesia Plan Comments:         Anesthesia Quick Evaluation

## 2020-02-23 NOTE — Progress Notes (Signed)
Corey Brady was seen at home today following his scheduled cardioversion. He had converted via increased amiodarone over the past several days so my visit was to ensure that his dose was correct moving forward. I adjusted his amiodarone to comply with orders or 200 mg BID. I will follow up next week.  Jacqualine Code, EMT

## 2020-02-23 NOTE — Progress Notes (Signed)
DCCV cancelled per MD Bensimhon. Pt in NSR

## 2020-02-26 ENCOUNTER — Encounter: Payer: Self-pay | Admitting: Family Medicine

## 2020-02-27 ENCOUNTER — Other Ambulatory Visit (HOSPITAL_COMMUNITY): Payer: Self-pay

## 2020-02-27 ENCOUNTER — Emergency Department (HOSPITAL_COMMUNITY): Payer: Self-pay

## 2020-02-27 ENCOUNTER — Encounter (HOSPITAL_COMMUNITY): Payer: Self-pay

## 2020-02-27 ENCOUNTER — Emergency Department (HOSPITAL_COMMUNITY)
Admission: EM | Admit: 2020-02-27 | Discharge: 2020-02-27 | Disposition: A | Discharge status: Home / Self Care | Payer: Self-pay | Attending: Emergency Medicine | Admitting: Emergency Medicine

## 2020-02-27 ENCOUNTER — Telehealth (HOSPITAL_COMMUNITY): Payer: Self-pay | Admitting: *Deleted

## 2020-02-27 DIAGNOSIS — I4892 Unspecified atrial flutter: Secondary | ICD-10-CM

## 2020-02-27 DIAGNOSIS — Z7982 Long term (current) use of aspirin: Secondary | ICD-10-CM | POA: Insufficient documentation

## 2020-02-27 DIAGNOSIS — Z79899 Other long term (current) drug therapy: Secondary | ICD-10-CM | POA: Insufficient documentation

## 2020-02-27 DIAGNOSIS — Z951 Presence of aortocoronary bypass graft: Secondary | ICD-10-CM | POA: Insufficient documentation

## 2020-02-27 DIAGNOSIS — R001 Bradycardia, unspecified: Secondary | ICD-10-CM | POA: Insufficient documentation

## 2020-02-27 DIAGNOSIS — I509 Heart failure, unspecified: Secondary | ICD-10-CM | POA: Insufficient documentation

## 2020-02-27 HISTORY — DX: Heart failure, unspecified: I50.9

## 2020-02-27 LAB — COMPREHENSIVE METABOLIC PANEL
ALT: 23 U/L (ref 0–44)
AST: 26 U/L (ref 15–41)
Albumin: 3.4 g/dL — ABNORMAL LOW (ref 3.5–5.0)
Alkaline Phosphatase: 98 U/L (ref 38–126)
Anion gap: 12 (ref 5–15)
BUN: 30 mg/dL — ABNORMAL HIGH (ref 8–23)
CO2: 26 mmol/L (ref 22–32)
Calcium: 9 mg/dL (ref 8.9–10.3)
Chloride: 98 mmol/L (ref 98–111)
Creatinine, Ser: 1.14 mg/dL (ref 0.61–1.24)
GFR calc Af Amer: >60 mL/min (ref >60)
GFR calc non Af Amer: >60 mL/min (ref >60)
Glucose, Bld: 99 mg/dL (ref 70–99)
Potassium: 4.5 mmol/L (ref 3.5–5.1)
Sodium: 136 mmol/L (ref 135–145)
Total Bilirubin: 0.7 mg/dL (ref 0.3–1.2)
Total Protein: 6.9 g/dL (ref 6.5–8.1)

## 2020-02-27 LAB — CBC
HCT: 38.2 % — ABNORMAL LOW (ref 39.0–52.0)
Hemoglobin: 12.1 g/dL — ABNORMAL LOW (ref 13.0–17.0)
MCH: 29.6 pg (ref 26.0–34.0)
MCHC: 31.7 g/dL (ref 30.0–36.0)
MCV: 93.4 fL (ref 80.0–100.0)
Platelets: 215 10*3/uL (ref 150–400)
RBC: 4.09 MIL/uL — ABNORMAL LOW (ref 4.22–5.81)
RDW: 14.5 % (ref 11.5–15.5)
WBC: 7.1 10*3/uL (ref 4.0–10.5)
nRBC: 0 % (ref 0.0–0.2)

## 2020-02-27 LAB — DIGOXIN LEVEL: Digoxin Level: 0.7 ng/mL — ABNORMAL LOW (ref 0.8–2.0)

## 2020-02-27 LAB — MAGNESIUM: Magnesium: 2.1 mg/dL (ref 1.7–2.4)

## 2020-02-27 MED ORDER — AMIODARONE HCL 200 MG PO TABS: 200.0000 mg | ORAL_TABLET | Freq: Every day | ORAL | 0 refills | Status: DC

## 2020-02-27 NOTE — ED Provider Notes (Signed)
MSE was initiated and I personally evaluated the patient and placed orders (if any) at  2:29 PM on February 27, 2020.  The patient appears stable so that the remainder of the MSE may be completed by another provider.  64 year old male status post CABG early May who presents today due to bradycardia.  Patient is followed at home by the paramedic team.  They checked on him today and reported that his heart rate was 40.  Blood pressure was normal.  Patient is not having pain.  Subsequently, he was brought to the emergency department for further evaluation WDWN male nad Lungs cta HR- bradycardia and regular Lower extremities with dressing and bilateral rash without edem  Orders placed Patient hemodynamically stable here   Margarita Grizzle, MD 02/27/20 1434

## 2020-02-27 NOTE — Telephone Encounter (Signed)
Zack w/prarmedicine called to report pts heart rate of 40bpm. Pt is asymptomatic and his bp is normal. Per Amy Clegg,NP patient needs to go to the emergency room. Zack aware and will send patient to the ER.

## 2020-02-27 NOTE — Progress Notes (Signed)
Paramedicine Encounter    Patient ID: Corey Brady, male    DOB: 11/25/1955, 64 y.o.   MRN: [id number]   Patient Care Team: Patient, No Pcp Per as PCP - General (General Practice) Bensimhon, Bevelyn Buckles, MD as PCP - Advanced Heart Failure (Cardiology)  Patient Active Problem List   Diagnosis Date Noted  . S/P CABG (coronary artery bypass graft) 01/30/2020  . Acute heart failure (HCC) 01/23/2020    Current Outpatient Medications:  .  apixaban (ELIQUIS) 5 MG TABS tablet, Take 1 tablet (5 mg total) by mouth 2 (two) times daily., Disp: 180 tablet, Rfl: 3 .  aspirin 81 MG chewable tablet, Chew 1 tablet (81 mg total) by mouth daily., Disp:  , Rfl:  .  atorvastatin (LIPITOR) 40 MG tablet, Take 1 tablet (40 mg total) by mouth daily. (Patient taking differently: Take 40 mg by mouth at bedtime. ), Disp: 90 tablet, Rfl: 3 .  furosemide (LASIX) 20 MG tablet, Take 1 tablet (20 mg total) by mouth daily., Disp: 30 tablet, Rfl: 3 .  hydrocerin (EUCERIN) CREA, Apply 1 application topically 2 (two) times daily., Disp: 228 g, Rfl: 0 .  losartan (COZAAR) 25 MG tablet, Take 0.5 tablets (12.5 mg total) by mouth daily., Disp: 45 tablet, Rfl: 1 .  spironolactone (ALDACTONE) 25 MG tablet, Take 1 tablet (25 mg total) by mouth daily. HF FUND (Patient taking differently: Take 25 mg by mouth at bedtime. HF FUND), Disp: 45 tablet, Rfl: 1 .  amiodarone (PACERONE) 200 MG tablet, Take 1 tablet (200 mg total) by mouth daily for 5 days., Disp: 5 tablet, Rfl: 0 .  traMADol (ULTRAM) 50 MG tablet, Take 1 tablet (50 mg total) by mouth every 6 (six) hours as needed for moderate pain. (Patient not taking: Reported on 02/27/2020), Disp: 28 tablet, Rfl: 0 Allergies  Allergen Reactions  . Other Other (See Comments)    No blood products      Social History   Socioeconomic History  . Marital status: Single    Spouse name: Not on file  . Number of children: Not on file  . Years of education: Not on file  . Highest education  level: Not on file  Occupational History  . Not on file  Tobacco Use  . Smoking status: Never Smoker  . Smokeless tobacco: Never Used  Substance and Sexual Activity  . Alcohol use: Yes  . Drug use: Not on file  . Sexual activity: Not on file  Other Topics Concern  . Not on file  Social History Narrative  . Not on file   Social Determinants of Health   Financial Resource Strain: Low Risk   . Difficulty of Paying Living Expenses: Not very hard  Food Insecurity: No Food Insecurity  . Worried About Programme researcher, broadcasting/film/video in the Last Year: Never true  . Ran Out of Food in the Last Year: Never true  Transportation Needs: No Transportation Needs  . Lack of Transportation (Medical): No  . Lack of Transportation (Non-Medical): No  Physical Activity:   . Days of Exercise per Week:   . Minutes of Exercise per Session:   Stress:   . Feeling of Stress :   Social Connections:   . Frequency of Communication with Friends and Family:   . Frequency of Social Gatherings with Friends and Family:   . Attends Religious Services:   . Active Member of Clubs or Organizations:   . Attends Banker Meetings:   Marland Kitchen Marital Status:  Intimate Partner Violence:   . Fear of Current or Ex-Partner:   . Emotionally Abused:   Marland Kitchen Physically Abused:   . Sexually Abused:     Physical Exam Cardiovascular:     Rate and Rhythm: Regular rhythm. Bradycardia present.     Pulses: Normal pulses.  Pulmonary:     Effort: Pulmonary effort is normal.     Breath sounds: Normal breath sounds.  Musculoskeletal:        General: Normal range of motion.     Right lower leg: Edema present.     Left lower leg: Edema present.  Skin:    General: Skin is warm and dry.     Capillary Refill: Capillary refill takes less than 2 seconds.  Neurological:     Mental Status: He is alert and oriented to person, place, and time.  Psychiatric:        Mood and Affect: Mood normal.         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 He denied chest pain, SOB, headache, dizziness, orthopnea, fever or cough since our last visit. He stated he has been compliant with his medications over the pats week and his weight has been stable. Upon checking his vital signs, I noted him to be bradycardic at a rate of 40 bpm. I contacted the HF clinic for direction and was instructed to have him sent to the ED for further evaluation. Report was given to responding EMS unit with special emphasis on his cardioversion via amiodarone last week and they transported from the scene. I will follow up upon discharge.  Jacquiline Doe, EMT 02/27/20  ACTION: Home visit completed Next visit planned for discharge from hospital.

## 2020-02-27 NOTE — ED Triage Notes (Signed)
Pt bib [facility] from home w/ c/o bradycardia. Pt has hx a-fib and CABG. Pt had CABG done on 5/4, and a recent cardioversion per EMS. HR 40 w/ EMS, pt asymptomatic. BP 122/73, CBG 107, RR 76.

## 2020-02-27 NOTE — ED Notes (Signed)
Patient verbalizes understanding of discharge instructions. Opportunity for questioning and answers were provided. Pt discharged from ED. 

## 2020-02-27 NOTE — ED Provider Notes (Signed)
[facility] EMERGENCY DEPARTMENT Provider Note   CSN: [id number] Arrival date & time: 02/27/20  1403     History Chief Complaint  Patient presents with   Bradycardia    Corey Brady is a 64 y.o. male.  64 yo M with chief complaints of asymptomatic bradycardia.  Patient has been doing well after having bypass surgery.  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  Patient denies any chest pain or shortness of breath denies any feeling like he may pass out.  Other than medicines recently prescribed and changed by his cardiologist he denies any new medicines.  Denies seeing ophthalmologist or doing eyedrops.  The history is provided by the patient.  Illness Severity:  Moderate Onset quality:  Gradual Duration:  2 hours Timing:  Constant Progression:  Resolved Chronicity:  New Associated symptoms: no abdominal pain, no chest pain, no congestion, no diarrhea, no fever, no headaches, no myalgias, no rash, no shortness of breath and no vomiting        Past Medical History:  Diagnosis Date   CHF (congestive heart failure) (HCC)    Gout    No blood products 01/27/2020    Patient Active Problem List   Diagnosis Date Noted   S/P CABG (coronary artery bypass graft) 01/30/2020   Acute heart failure ([place]) 01/23/2020    Past Surgical History:  Procedure Laterality Date   CARDIOVERSION N/A 02/06/2020   Procedure: CARDIOVERSION;  Surgeon: Jolaine Artist, MD;  Location: [place];  Service: Cardiovascular;  Laterality: N/A;   CORONARY ARTERY BYPASS GRAFT N/A 01/30/2020   Procedure: OFF PUMP CORONARY ARTERY BYPASS GRAFTING (CABG) TIMES ONE;  Surgeon: Lajuana Matte, MD;  Location: [place];  Service: Open Heart Surgery;  Laterality: N/A;  swan only   RIGHT/LEFT HEART CATH AND CORONARY ANGIOGRAPHY N/A 01/26/2020   Procedure: RIGHT/LEFT HEART CATH AND CORONARY ANGIOGRAPHY;  Surgeon:  Jolaine Artist, MD;  Location: [place] CV LAB;  Service: Cardiovascular;  Laterality: N/A;   TEE WITHOUT CARDIOVERSION N/A 01/30/2020   Procedure: TRANSESOPHAGEAL ECHOCARDIOGRAM (TEE);  Surgeon: Lajuana Matte, MD;  Location: [place];  Service: Open Heart Surgery;  Laterality: N/A;   TEE WITHOUT CARDIOVERSION N/A 02/06/2020   Procedure: TRANSESOPHAGEAL ECHOCARDIOGRAM (TEE);  Surgeon: Jolaine Artist, MD;  Location: [facility] ENDOSCOPY;  Service: Cardiovascular;  Laterality: N/A;       Family History  Problem Relation Age of Onset   Gout Sister     Social History   Tobacco Use   Smoking status: Never Smoker   Smokeless tobacco: Never Used  Substance Use Topics   Alcohol use: Yes   Drug use: Not on file    Home Medications Prior to Admission medications   Medication Sig Start Date End Date Taking? Authorizing Provider  apixaban (ELIQUIS) 5 MG TABS tablet Take 1 tablet (5 mg total) by mouth 2 (two) times daily. 02/16/20  Yes Fulp, Cammie, MD  aspirin 81 MG chewable tablet Chew 1 tablet (81 mg total) by mouth daily. 02/09/20  Yes Gold, Wilder Glade, PA-C  amiodarone (PACERONE) 200 MG tablet Take 1 tablet (200 mg total) by mouth daily for 5 days. 02/27/20 03/03/20  Deno Etienne, DO  atorvastatin (LIPITOR) 40 MG tablet Take 1 tablet (40 mg total) by mouth daily. Patient taking differently: Take 40 mg by mouth at bedtime.  02/16/20   Antony Blackbird, MD  furosemide (  LASIX) 20 MG tablet Take 1 tablet (20 mg total) by mouth daily. 02/19/20   Robbie Lis M, PA-C  hydrocerin (EUCERIN) CREA Apply 1 application topically 2 (two) times daily. 02/08/20   Gold, Wayne E, PA-C  losartan (COZAAR) 25 MG tablet Take 0.5 tablets (12.5 mg total) by mouth daily. 02/16/20   Fulp, Cammie, MD  spironolactone (ALDACTONE) 25 MG tablet Take 1 tablet (25 mg total) by mouth daily. HF FUND Patient taking differently: Take 25 mg by mouth at bedtime. HF FUND 02/20/20   Robbie Lis M, PA-C  traMADol (ULTRAM)  50 MG tablet Take 1 tablet (50 mg total) by mouth every 6 (six) hours as needed for moderate pain. Patient not taking: Reported on 02/27/2020 02/08/20   Rowe Clack, PA-C  digoxin (LANOXIN) 0.125 MG tablet Take 1 tablet (0.125 mg total) by mouth daily. 02/16/20 02/27/20  Cain Saupe, MD    Allergies    Other  Review of Systems   Review of Systems  Constitutional: Negative for chills and fever.  HENT: Negative for congestion and facial swelling.   Eyes: Negative for discharge and visual disturbance.  Respiratory: Negative for shortness of breath.   Cardiovascular: Positive for leg swelling (improving per patient). Negative for chest pain and palpitations.  Gastrointestinal: Negative for abdominal pain, diarrhea and vomiting.  Musculoskeletal: Negative for arthralgias and myalgias.  Skin: Negative for color change and rash.  Neurological: Negative for tremors, syncope and headaches.  Psychiatric/Behavioral: Negative for confusion and dysphoric mood.    Physical Exam Updated Vital Signs BP 124/69    Pulse (!) 45    Temp 98.1 F (36.7 C) (Oral)    Resp 17    SpO2 100%   Physical Exam Vitals and nursing note reviewed.  Constitutional:      Appearance: He is well-developed.  HENT:     Head: Normocephalic and atraumatic.  Eyes:     Pupils: Pupils are equal, round, and reactive to light.  Neck:     Vascular: No JVD.  Cardiovascular:     Rate and Rhythm: Normal rate and regular rhythm.     Heart sounds: No murmur. No friction rub. No gallop.   Pulmonary:     Effort: No respiratory distress.     Breath sounds: No wheezing.  Abdominal:     General: There is no distension.     Tenderness: There is no abdominal tenderness. There is no guarding or rebound.  Musculoskeletal:        General: Normal range of motion.     Cervical back: Normal range of motion and neck supple.     Right lower leg: Edema present.     Left lower leg: Edema present.     Comments: 2+ to BLE up to the shins.   Mild erythema no warmth  Skin:    Coloration: Skin is not pale.     Findings: No rash.  Neurological:     Mental Status: He is alert and oriented to person, place, and time.  Psychiatric:        Behavior: Behavior normal.     ED Results / Procedures / Treatments   Labs (all labs ordered are listed, but only abnormal results are displayed) Labs Reviewed  CBC - Abnormal; Notable for the following components:      Result Value   RBC 4.09 (*)    Hemoglobin 12.1 (*)    HCT 38.2 (*)    All other components within normal limits  COMPREHENSIVE  METABOLIC PANEL - Abnormal; Notable for the following components:   BUN 30 (*)    Albumin 3.4 (*)    All other components within normal limits  DIGOXIN LEVEL - Abnormal; Notable for the following components:   Digoxin Level 0.7 (*)    All other components within normal limits  MAGNESIUM    EKG EKG Interpretation  Date/Time:  Tuesday February 27 2020 14:12:49 EDT Ventricular Rate:  39 PR Interval:    QRS Duration: 108 QT Interval:  552 QTC Calculation: 445 R Axis:   -5 Text Interpretation: Sinus bradycardia LVH with secondary repolarization abnormality Since last tracing rate slower Otherwise no significant change Confirmed by Melene Plan [phone]) on 02/27/2020 3:03:17 PM   Radiology DG Chest Port 1 View  Result Date: 02/27/2020 CLINICAL DATA:  64 year old male with bradycardia. EXAM: PORTABLE CHEST 1 VIEW COMPARISON:  Portable chest 01/03/2020 and earlier. FINDINGS: Portable AP view at 1500 hours. Left IJ introducer sheath and right PICC line have been removed. Stable cardiomegaly. Prior sternotomy. Bilateral ventilation appears improved, with decreased pulmonary vascularity. Allowing for portable technique the lungs are clear. No pneumothorax or pleural effusion. No acute osseous abnormality identified. IMPRESSION: Stable cardiomegaly. No acute cardiopulmonary abnormality. Electronically Signed   By: Odessa Fleming M.D.   On: 02/27/2020 15:21     Procedures Procedures (including critical care time)  Medications Ordered in ED Medications - No data to display  ED Course  I have reviewed the triage vital signs and the nursing notes.  Pertinent labs & imaging results that were available during my care of the patient were reviewed by me and considered in my medical decision making (see chart for details).    MDM Rules/Calculators/A&P                      64 yo M with asymptomatic bradycardia.  Heart rate had improved by my exam.  Now in the 60s.  Continues to be asymptomatic.  Awaiting lab work.  Likely will discuss with cardiology to see if they want to change any of his medications.  I discussed case with Dr. Antoine Poche, he suggested that the patient stop taking his digoxin and switch his amiodarone to 1 pill once a day.  Patient will call his cardiologist or talk with the paramedics tomorrow and let them know how he is doing.  4:35 PM:  I have discussed the diagnosis/risks/treatment options with the patient and believe the pt to be eligible for discharge home to follow-up with Cards. We also discussed returning to the ED immediately if new or worsening sx occur. We discussed the sx which are most concerning (e.g., chest pain, syncope, weakness) that necessitate immediate return. Medications administered to the patient during their visit and any new prescriptions provided to the patient are listed below.  Medications given during this visit Medications - No data to display   The patient appears reasonably screen and/or stabilized for discharge and I doubt any other medical condition or other [facility] requiring further screening, evaluation, or treatment in the ED at this time prior to discharge.   Final Clinical Impression(s) / ED Diagnoses Final diagnoses:  Bradycardia    Rx / DC Orders ED Discharge Orders         Ordered    amiodarone (PACERONE) 200 MG tablet  Daily    Note to Pharmacy: HF FUND   02/27/20 1633            Melene Plan, DO 02/27/20 1635

## 2020-02-27 NOTE — ED Notes (Signed)
IV in RAC removed; site clean, dry

## 2020-02-27 NOTE — Discharge Instructions (Signed)
Follow-up with your cardiologist tomorrow and let them know about your medication changes.  Please return for feeling like you may pass out chest pain weakness.

## 2020-02-28 ENCOUNTER — Other Ambulatory Visit (HOSPITAL_COMMUNITY): Payer: Self-pay | Admitting: Internal Medicine

## 2020-02-28 ENCOUNTER — Other Ambulatory Visit (HOSPITAL_COMMUNITY): Payer: Self-pay

## 2020-02-28 NOTE — Progress Notes (Signed)
Paramedicine Encounter    Patient ID: Corey Brady, male    DOB: 05/17/1956, 64 y.o.   MRN: [id number]   Patient Care Team: Patient, No Pcp Per as PCP - General (General Practice) Bensimhon, Bevelyn Buckles, MD as PCP - Advanced Heart Failure (Cardiology)  Patient Active Problem List   Diagnosis Date Noted  . S/P CABG (coronary artery bypass graft) 01/30/2020  . Acute heart failure (HCC) 01/23/2020    Current Outpatient Medications:  .  amiodarone (PACERONE) 200 MG tablet, Take 1 tablet (200 mg total) by mouth daily for 5 days., Disp: 5 tablet, Rfl: 0 .  apixaban (ELIQUIS) 5 MG TABS tablet, Take 1 tablet (5 mg total) by mouth 2 (two) times daily., Disp: 180 tablet, Rfl: 3 .  aspirin 81 MG chewable tablet, Chew 1 tablet (81 mg total) by mouth daily., Disp:  , Rfl:  .  atorvastatin (LIPITOR) 40 MG tablet, Take 1 tablet (40 mg total) by mouth daily. (Patient taking differently: Take 40 mg by mouth at bedtime. ), Disp: 90 tablet, Rfl: 3 .  furosemide (LASIX) 20 MG tablet, Take 1 tablet (20 mg total) by mouth daily., Disp: 30 tablet, Rfl: 3 .  hydrocerin (EUCERIN) CREA, Apply 1 application topically 2 (two) times daily., Disp: 228 g, Rfl: 0 .  losartan (COZAAR) 25 MG tablet, Take 0.5 tablets (12.5 mg total) by mouth daily., Disp: 45 tablet, Rfl: 1 .  spironolactone (ALDACTONE) 25 MG tablet, Take 1 tablet (25 mg total) by mouth daily. HF FUND (Patient taking differently: Take 25 mg by mouth at bedtime. HF FUND), Disp: 45 tablet, Rfl: 1 .  traMADol (ULTRAM) 50 MG tablet, Take 1 tablet (50 mg total) by mouth every 6 (six) hours as needed for moderate pain. (Patient not taking: Reported on 02/27/2020), Disp: 28 tablet, Rfl: 0 Allergies  Allergen Reactions  . Other Other (See Comments)    No blood products      Social History   Socioeconomic History  . Marital status: Single    Spouse name: Not on file  . Number of children: Not on file  . Years of education: Not on file  . Highest education  level: Not on file  Occupational History  . Not on file  Tobacco Use  . Smoking status: Never Smoker  . Smokeless tobacco: Never Used  Substance and Sexual Activity  . Alcohol use: Yes  . Drug use: Not on file  . Sexual activity: Not on file  Other Topics Concern  . Not on file  Social History Narrative  . Not on file   Social Determinants of Health   Financial Resource Strain: Low Risk   . Difficulty of Paying Living Expenses: Not very hard  Food Insecurity: No Food Insecurity  . Worried About Programme researcher, broadcasting/film/video in the Last Year: Never true  . Ran Out of Food in the Last Year: Never true  Transportation Needs: No Transportation Needs  . Lack of Transportation (Medical): No  . Lack of Transportation (Non-Medical): No  Physical Activity:   . Days of Exercise per Week:   . Minutes of Exercise per Session:   Stress:   . Feeling of Stress :   Social Connections:   . Frequency of Communication with Friends and Family:   . Frequency of Social Gatherings with Friends and Family:   . Attends Religious Services:   . Active Member of Clubs or Organizations:   . Attends Banker Meetings:   Marland Kitchen Marital Status:  Intimate Partner Violence:   . Fear of Current or Ex-Partner:   . Emotionally Abused:   Marland Kitchen Physically Abused:   . Sexually Abused:     Physical Exam      Future Appointments  Date Time Provider [place]  03/12/2020 10:30 AM [facility] PA/NP [facility] None  03/22/2020  9:45 AM Lightfoot, Lucile Crater, MD [facility]  03/29/2020 11:10 AM Fulp, Cammie, MD [facility] None    BP 133/66 (BP Location: Left Arm, Patient Position: Sitting, Cuff Size: Normal)   Pulse (!) 44   Resp 16   Wt 221 lb (100.2 kg)   SpO2 99%   BMI 29.97 kg/m   Weight yesterday- 222 lb Last visit weight- n/a  [name] was seen at home yesterday to amend his pillbox following his ED visit. He stated he had already changed some things so I took a look. He had removed all his  evening amiodarone doses with the exception of one he missed and all of his digoxin, in accordance with the orders of the ED physician. I contacted the HF clinic to confirm these changes and Brittainy Simmons, PA-C, advised to restart digoxin on Friday but give it every other day moving forward. I made this change and obtained vital signs. He was noted to still be bradycardic but remained asymptomatic. I will follow up next week.   Corey Brady, EMT 02/28/20  ACTION: Home visit completed Next visit planned for 1 week

## 2020-02-29 MED FILL — FUROSEMIDE 20 MG TABS: 20 | 34 days supply | Qty: 34 | Fill #0

## 2020-02-29 MED FILL — SPIRONOLACTONE 25 MG TABS: 25 | 34 days supply | Qty: 34 | Fill #0

## 2020-02-29 MED FILL — AMIODARONE HCL 200 MG TAB: 200 | 30 days supply | Qty: 30 | Fill #0

## 2020-03-01 ENCOUNTER — Other Ambulatory Visit (HOSPITAL_COMMUNITY): Payer: Self-pay

## 2020-03-01 NOTE — Telephone Encounter (Signed)
Advanced Heart Failure Patient Advocate Encounter   Patient was approved to receive Eliquis from [facility].  Patient ID: [id number] Effective dates: 03/01/20 through 02/28/21  Karle Plumber, PharmD, BCPS, BCCP, CPP Heart Failure Clinic Pharmacist [phone]

## 2020-03-01 NOTE — Progress Notes (Signed)
Corey Brady was seen at home today and reported feeling well. The purpose of today's visit was to deliver medications from the pharmacy and add them to his pillbox. Nothing further was necessary at this time. I will follow up next week.   Jacqualine Code, EMT 03/01/20

## 2020-03-05 ENCOUNTER — Other Ambulatory Visit (HOSPITAL_COMMUNITY): Payer: Self-pay

## 2020-03-05 ENCOUNTER — Other Ambulatory Visit (HOSPITAL_COMMUNITY): Payer: Self-pay | Admitting: *Deleted

## 2020-03-05 DIAGNOSIS — Z8679 Personal history of other diseases of the circulatory system: Secondary | ICD-10-CM

## 2020-03-05 DIAGNOSIS — I5032 Chronic diastolic (congestive) heart failure: Secondary | ICD-10-CM

## 2020-03-05 DIAGNOSIS — I2510 Atherosclerotic heart disease of native coronary artery without angina pectoris: Secondary | ICD-10-CM

## 2020-03-05 MED ORDER — DIGOXIN 125 MCG PO TABS: 0.1250 mg | ORAL_TABLET | ORAL | 1 refills | Status: DC

## 2020-03-05 MED ORDER — LOSARTAN POTASSIUM 25 MG PO TABS: 12.5000 mg | ORAL_TABLET | Freq: Every day | ORAL | 1 refills | Status: DC

## 2020-03-05 MED ORDER — ATORVASTATIN CALCIUM 40 MG PO TABS: 40.0000 mg | ORAL_TABLET | Freq: Every day | ORAL | 3 refills | Status: DC

## 2020-03-05 NOTE — Progress Notes (Signed)
Paramedicine Encounter    Patient ID: Corey Brady, male    DOB: 05/17/1956, 64 y.o.   MRN: [id number]   Patient Care Team: Patient, No Pcp Per as PCP - General (General Practice) Bensimhon, Bevelyn Buckles, MD as PCP - Advanced Heart Failure (Cardiology)  Patient Active Problem List   Diagnosis Date Noted  . S/P CABG (coronary artery bypass graft) 01/30/2020  . Acute heart failure (HCC) 01/23/2020    Current Outpatient Medications:  .  amiodarone (PACERONE) 200 MG tablet, Take 1 tablet (200 mg total) by mouth daily for 5 days., Disp: 5 tablet, Rfl: 0 .  apixaban (ELIQUIS) 5 MG TABS tablet, Take 1 tablet (5 mg total) by mouth 2 (two) times daily., Disp: 180 tablet, Rfl: 3 .  aspirin 81 MG chewable tablet, Chew 1 tablet (81 mg total) by mouth daily., Disp:  , Rfl:  .  atorvastatin (LIPITOR) 40 MG tablet, Take 1 tablet (40 mg total) by mouth daily. (Patient taking differently: Take 40 mg by mouth at bedtime. ), Disp: 90 tablet, Rfl: 3 .  furosemide (LASIX) 20 MG tablet, Take 1 tablet (20 mg total) by mouth daily., Disp: 30 tablet, Rfl: 3 .  hydrocerin (EUCERIN) CREA, Apply 1 application topically 2 (two) times daily., Disp: 228 g, Rfl: 0 .  losartan (COZAAR) 25 MG tablet, Take 0.5 tablets (12.5 mg total) by mouth daily., Disp: 45 tablet, Rfl: 1 .  spironolactone (ALDACTONE) 25 MG tablet, Take 1 tablet (25 mg total) by mouth daily. HF FUND (Patient taking differently: Take 25 mg by mouth at bedtime. HF FUND), Disp: 45 tablet, Rfl: 1 .  traMADol (ULTRAM) 50 MG tablet, Take 1 tablet (50 mg total) by mouth every 6 (six) hours as needed for moderate pain. (Patient not taking: Reported on 02/27/2020), Disp: 28 tablet, Rfl: 0 Allergies  Allergen Reactions  . Other Other (See Comments)    No blood products      Social History   Socioeconomic History  . Marital status: Single    Spouse name: Not on file  . Number of children: Not on file  . Years of education: Not on file  . Highest education  level: Not on file  Occupational History  . Not on file  Tobacco Use  . Smoking status: Never Smoker  . Smokeless tobacco: Never Used  Substance and Sexual Activity  . Alcohol use: Yes  . Drug use: Not on file  . Sexual activity: Not on file  Other Topics Concern  . Not on file  Social History Narrative  . Not on file   Social Determinants of Health   Financial Resource Strain: Low Risk   . Difficulty of Paying Living Expenses: Not very hard  Food Insecurity: No Food Insecurity  . Worried About Programme researcher, broadcasting/film/video in the Last Year: Never true  . Ran Out of Food in the Last Year: Never true  Transportation Needs: No Transportation Needs  . Lack of Transportation (Medical): No  . Lack of Transportation (Non-Medical): No  Physical Activity:   . Days of Exercise per Week:   . Minutes of Exercise per Session:   Stress:   . Feeling of Stress :   Social Connections:   . Frequency of Communication with Friends and Family:   . Frequency of Social Gatherings with Friends and Family:   . Attends Religious Services:   . Active Member of Clubs or Organizations:   . Attends Banker Meetings:   Marland Kitchen Marital Status:  Intimate Partner Violence:   . Fear of Current or Ex-Partner:   . Emotionally Abused:   Marland Kitchen Physically Abused:   . Sexually Abused:     Physical Exam Cardiovascular:     Rate and Rhythm: Normal rate and regular rhythm.     Pulses: Normal pulses.  Pulmonary:     Effort: Pulmonary effort is normal.     Breath sounds: Normal breath sounds.  Musculoskeletal:        General: Normal range of motion.     Right lower leg: Edema present.     Left lower leg: Edema present.  Skin:    General: Skin is warm and dry.     Capillary Refill: Capillary refill takes less than 2 seconds.  Neurological:     Mental Status: He is alert and oriented to person, place, and time.  Psychiatric:        Mood and Affect: Mood normal.         Future Appointments  Date Time  Provider Department Center  03/12/2020 10:30 AM [facility] PA/NP [facility] None  03/21/2020  9:45 AM Lightfoot, Eliezer Lofts, MD [facility]  03/29/2020 11:10 AM Fulp, Cammie, MD [facility] None    BP 134/83 (BP Location: Left Arm, Patient Position: Sitting, Cuff Size: Normal)   Pulse 75   Resp 16   Wt 222 lb 11.2 oz (101 kg)   SpO2 99%   BMI 30.20 kg/m   Weight yesterday- 221.9 lb Last visit weight- 221 lb  [name] was seen at home today and reported feeling well. He denied chest pain, SOB, headache, dizziness, orthopnea, fever or cough since our last visit. He stated he has been compliant with his medications over the past week and his weight has been stable. His mediations were verified and he was assisted in refilling his pillbox. I will follow up next week.   Jacqualine Code, EMT 03/05/20  ACTION: Home visit completed Next visit planned for 1 week

## 2020-03-12 ENCOUNTER — Other Ambulatory Visit: Payer: Self-pay

## 2020-03-12 ENCOUNTER — Inpatient Hospital Stay (HOSPITAL_COMMUNITY)
Admission: RE | Admit: 2020-03-12 | Discharge: 2020-03-18 | DRG: 273 | Disposition: A | Discharge status: Home / Self Care | Payer: Self-pay | Source: Ambulatory Visit | Attending: Internal Medicine | Admitting: Internal Medicine

## 2020-03-12 ENCOUNTER — Encounter (HOSPITAL_COMMUNITY): Payer: Self-pay

## 2020-03-12 ENCOUNTER — Ambulatory Visit (HOSPITAL_COMMUNITY)
Admission: RE | Admit: 2020-03-12 | Discharge: 2020-03-12 | Disposition: A | Discharge status: Home / Self Care | Payer: Self-pay | Source: Ambulatory Visit | Attending: Internal Medicine | Admitting: Internal Medicine

## 2020-03-12 ENCOUNTER — Other Ambulatory Visit (HOSPITAL_COMMUNITY): Payer: Self-pay

## 2020-03-12 ENCOUNTER — Encounter (HOSPITAL_COMMUNITY): Payer: Self-pay | Admitting: Internal Medicine

## 2020-03-12 ENCOUNTER — Inpatient Hospital Stay (HOSPITAL_COMMUNITY): Payer: Self-pay

## 2020-03-12 VITALS — BP 128/88 | HR 103 | Wt 226.6 lb

## 2020-03-12 DIAGNOSIS — I4891 Unspecified atrial fibrillation: Secondary | ICD-10-CM | POA: Insufficient documentation

## 2020-03-12 DIAGNOSIS — I110 Hypertensive heart disease with heart failure: Secondary | ICD-10-CM | POA: Diagnosis present

## 2020-03-12 DIAGNOSIS — I5023 Acute on chronic systolic (congestive) heart failure: Secondary | ICD-10-CM | POA: Insufficient documentation

## 2020-03-12 DIAGNOSIS — Z20822 Contact with and (suspected) exposure to covid-19: Secondary | ICD-10-CM | POA: Diagnosis present

## 2020-03-12 DIAGNOSIS — I484 Atypical atrial flutter: Principal | ICD-10-CM | POA: Diagnosis present

## 2020-03-12 DIAGNOSIS — Z951 Presence of aortocoronary bypass graft: Secondary | ICD-10-CM

## 2020-03-12 DIAGNOSIS — L03116 Cellulitis of left lower limb: Secondary | ICD-10-CM | POA: Diagnosis present

## 2020-03-12 DIAGNOSIS — Z7982 Long term (current) use of aspirin: Secondary | ICD-10-CM | POA: Insufficient documentation

## 2020-03-12 DIAGNOSIS — R001 Bradycardia, unspecified: Secondary | ICD-10-CM | POA: Diagnosis present

## 2020-03-12 DIAGNOSIS — L03115 Cellulitis of right lower limb: Secondary | ICD-10-CM | POA: Diagnosis present

## 2020-03-12 DIAGNOSIS — Z7901 Long term (current) use of anticoagulants: Secondary | ICD-10-CM

## 2020-03-12 DIAGNOSIS — I429 Cardiomyopathy, unspecified: Secondary | ICD-10-CM | POA: Diagnosis present

## 2020-03-12 DIAGNOSIS — I4892 Unspecified atrial flutter: Secondary | ICD-10-CM | POA: Insufficient documentation

## 2020-03-12 DIAGNOSIS — Z79899 Other long term (current) drug therapy: Secondary | ICD-10-CM | POA: Insufficient documentation

## 2020-03-12 DIAGNOSIS — I2510 Atherosclerotic heart disease of native coronary artery without angina pectoris: Secondary | ICD-10-CM | POA: Insufficient documentation

## 2020-03-12 DIAGNOSIS — R531 Weakness: Secondary | ICD-10-CM | POA: Diagnosis present

## 2020-03-12 LAB — COMPREHENSIVE METABOLIC PANEL
ALT: 28 U/L (ref 0–44)
AST: 33 U/L (ref 15–41)
Albumin: 3.9 g/dL (ref 3.5–5.0)
Alkaline Phosphatase: 87 U/L (ref 38–126)
Anion gap: 8 (ref 5–15)
BUN: 17 mg/dL (ref 8–23)
CO2: 26 mmol/L (ref 22–32)
Calcium: 9.4 mg/dL (ref 8.9–10.3)
Chloride: 102 mmol/L (ref 98–111)
Creatinine, Ser: 1.06 mg/dL (ref 0.61–1.24)
GFR calc Af Amer: >60 mL/min (ref >60)
GFR calc non Af Amer: >60 mL/min (ref >60)
Glucose, Bld: 100 mg/dL — ABNORMAL HIGH (ref 70–99)
Potassium: 4.4 mmol/L (ref 3.5–5.1)
Sodium: 136 mmol/L (ref 135–145)
Total Bilirubin: 0.7 mg/dL (ref 0.3–1.2)
Total Protein: 7.7 g/dL (ref 6.5–8.1)

## 2020-03-12 LAB — ECHOCARDIOGRAM COMPLETE
Height: 72 in
Weight: 3574.4 oz

## 2020-03-12 LAB — TSH: TSH: 4.165 u[IU]/mL (ref 0.350–4.500)

## 2020-03-12 LAB — CBC
HCT: 41.9 % (ref 39.0–52.0)
Hemoglobin: 13.6 g/dL (ref 13.0–17.0)
MCH: 30.3 pg (ref 26.0–34.0)
MCHC: 32.5 g/dL (ref 30.0–36.0)
MCV: 93.3 fL (ref 80.0–100.0)
Platelets: 214 10*3/uL (ref 150–400)
RBC: 4.49 MIL/uL (ref 4.22–5.81)
RDW: 14.4 % (ref 11.5–15.5)
WBC: 7.8 10*3/uL (ref 4.0–10.5)
nRBC: 0 % (ref 0.0–0.2)

## 2020-03-12 LAB — SARS CORONAVIRUS 2 BY RT PCR (HOSPITAL ORDER, PERFORMED IN ~~LOC~~ HOSPITAL LAB): SARS Coronavirus 2: NEGATIVE

## 2020-03-12 LAB — BRAIN NATRIURETIC PEPTIDE: B Natriuretic Peptide: 533.8 pg/mL — ABNORMAL HIGH (ref 0.0–100.0)

## 2020-03-12 MED ORDER — ATORVASTATIN CALCIUM 40 MG PO TABS
40.0000 mg | ORAL_TABLET | Freq: Every day | ORAL | Status: DC
  Administered 2020-03-13 – 2020-03-18 (×6): 40 mg via ORAL
  Filled 2020-03-12 (×6): qty 1

## 2020-03-12 MED ORDER — LOSARTAN POTASSIUM 25 MG PO TABS
12.5000 mg | ORAL_TABLET | Freq: Every day | ORAL | Status: DC
  Administered 2020-03-13 – 2020-03-16 (×4): 12.5 mg via ORAL
  Filled 2020-03-12 (×5): qty 1

## 2020-03-12 MED ORDER — SODIUM CHLORIDE 0.9 % IV SOLN
250.0000 mL | INTRAVENOUS | Status: DC | PRN
  Administered 2020-03-14: 250 mL via INTRAVENOUS

## 2020-03-12 MED ORDER — ACETAMINOPHEN 325 MG PO TABS
650.0000 mg | ORAL_TABLET | ORAL | Status: DC | PRN
  Filled 2020-03-12: qty 2

## 2020-03-12 MED ORDER — ASPIRIN 81 MG PO CHEW
81.0000 mg | CHEWABLE_TABLET | Freq: Every day | ORAL | Status: DC
  Administered 2020-03-13 – 2020-03-18 (×6): 81 mg via ORAL
  Filled 2020-03-12 (×6): qty 1

## 2020-03-12 MED ORDER — SPIRONOLACTONE 25 MG PO TABS
25.0000 mg | ORAL_TABLET | Freq: Every day | ORAL | Status: DC
  Administered 2020-03-12 – 2020-03-15 (×4): 25 mg via ORAL
  Filled 2020-03-12 (×4): qty 1

## 2020-03-12 MED ORDER — SODIUM CHLORIDE 0.9% FLUSH: 3.0000 mL | INTRAVENOUS | Status: DC | PRN

## 2020-03-12 MED ORDER — DIGOXIN 125 MCG PO TABS
0.1250 mg | ORAL_TABLET | ORAL | Status: DC
  Administered 2020-03-13 – 2020-03-15 (×2): 0.125 mg via ORAL
  Filled 2020-03-12 (×3): qty 1

## 2020-03-12 MED ORDER — FUROSEMIDE 10 MG/ML IJ SOLN
40.0000 mg | Freq: Two times a day (BID) | INTRAMUSCULAR | Status: DC
  Administered 2020-03-12 – 2020-03-15 (×6): 40 mg via INTRAVENOUS
  Filled 2020-03-12 (×6): qty 4

## 2020-03-12 MED ORDER — AMIODARONE HCL 200 MG PO TABS
200.0000 mg | ORAL_TABLET | Freq: Two times a day (BID) | ORAL | Status: DC
  Administered 2020-03-12 – 2020-03-15 (×7): 200 mg via ORAL
  Filled 2020-03-12 (×7): qty 1

## 2020-03-12 MED ORDER — APIXABAN 5 MG PO TABS
5.0000 mg | ORAL_TABLET | Freq: Two times a day (BID) | ORAL | Status: DC
  Administered 2020-03-12 – 2020-03-18 (×12): 5 mg via ORAL
  Filled 2020-03-12 (×12): qty 1

## 2020-03-12 MED ORDER — SODIUM CHLORIDE 0.9% FLUSH
3.0000 mL | Freq: Two times a day (BID) | INTRAVENOUS | Status: DC
  Administered 2020-03-12 – 2020-03-18 (×10): 3 mL via INTRAVENOUS

## 2020-03-12 MED ORDER — VANCOMYCIN HCL 2000 MG/400ML IV SOLN
2000.0000 mg | Freq: Once | INTRAVENOUS | Status: AC
  Administered 2020-03-12: 2000 mg via INTRAVENOUS
  Filled 2020-03-12: qty 400

## 2020-03-12 MED ORDER — ONDANSETRON HCL 4 MG/2ML IJ SOLN: 4.0000 mg | Freq: Four times a day (QID) | INTRAMUSCULAR | Status: DC | PRN

## 2020-03-12 MED ORDER — VANCOMYCIN HCL 750 MG/150ML IV SOLN
750.0000 mg | Freq: Two times a day (BID) | INTRAVENOUS | Status: DC
  Administered 2020-03-13 – 2020-03-14 (×3): 750 mg via INTRAVENOUS
  Filled 2020-03-12 (×4): qty 150

## 2020-03-12 NOTE — Progress Notes (Signed)
Pharmacy Antibiotic Note  Corey Brady is a 64 y.o. male admitted on 03/12/2020 with cellulitis.  Pharmacy has been consulted for vancomycin dosing.  Presenting with swelling and redness in legs - recently completed 7 days keflex. WBC 7.8, afebrile. Scr 1.06 (normCrCl 72 mL/min).    Plan: Vancomycin 2g IV once then 750 mg IV every 12 hours  Monitor renal fx, clinical pic, and levels as appropriate  Height: 6' (182.9 cm) Weight: 101.3 kg (223 lb 6.4 oz) IBW/kg (Calculated) : 77.6  Temp (24hrs), Avg:97.6 F (36.4 C), Min:97.6 F (36.4 C), Max:97.6 F (36.4 C)  Recent Labs  Lab 03/12/20 1127  WBC 7.8  CREATININE 1.06    Estimated Creatinine Clearance: 87.9 mL/min (by C-G formula based on SCr of 1.06 mg/dL).    Allergies  Allergen Reactions  . Other Other (See Comments)    No blood products    Antimicrobials this admission: Vancomycin 6/15 >>   Dose adjustments this admission: N/A  Microbiology results: N/A  Thank you for allowing pharmacy to be a part of this patient's care.  Sherron Monday, PharmD, BCCCP Clinical Pharmacist  Phone: [phone] 03/12/2020 2:11 PM  Please check AMION for all [facility] Pharmacy phone numbers After 10:00 PM, call Main Pharmacy [phone]

## 2020-03-12 NOTE — Addendum Note (Signed)
Encounter addended by: Sherald Hess, NP on: 03/12/2020 1:17 PM  Actions taken: Clinical Note Signed

## 2020-03-12 NOTE — Progress Notes (Addendum)
Advanced Heart Failure Clinic Note    PCP: Patient, No Pcp Per PCP-Cardiologist: [name]   HPI:  64 y/o male, recently admitted to [facility] 01/23/20 w/ 3 month h/o progressive dyspnea, LEE, orthopnea and PND and diagnosed w/ acute CHF w/ massive volume overload/ anasarca, also in the setting of elevated BP. Diuresed and started on HF/CHF meds. Echo showed severely reduced LVEF 15% with severe global HK  Moderate RV dysfunction, leading to [facility] which showed Severe 1v CAD with ostially occlusion of LAD with prominent R->L collaterals and good target and well preserved hemodynamics. TCTS was consulted and he underwent CABG x 1 w/ LIMA- LAD. Require milrinone post operatively. Weaned off w/ stable co-ox. Post op course also c/b atrial flutter requiring amiodarone + TEE guided DCCV back to NSR. 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   Evaluated in [facility] ED on 6/1 with bradycardia. Heart rate was in the 40s. Digoxin was stopped and amiodarone was cut back to once a day.   Today he returns for HF follow up.Overall feeling fine. Says he has noticed legs are getting red again and have started to swell. He completed 7 days antibiotics. Denies SOB/PND/Orthopnea. Appetite ok. No fever or chills. Weight at home 223 pounds. Taking all medications. Followed closely by HF Paramedicine.   EKG : Aflutter 101 bpm   Echo (pre CABG) 4/21: LVEF <20%. GIII. RV systolic function moderately reduced.     Past Medical History:  Diagnosis Date   CHF (congestive heart failure) (HCC)    Gout    No blood products 01/27/2020    Current Outpatient Medications  Medication Sig Dispense Refill   amiodarone (PACERONE) 200 MG tablet Take 200 mg by mouth daily.     apixaban (ELIQUIS) 5 MG TABS tablet Take 1 tablet (5 mg total) by mouth 2 (two) times daily. 180 tablet 3   aspirin 81 MG chewable tablet  Chew 1 tablet (81 mg total) by mouth daily.     atorvastatin (LIPITOR) 40 MG tablet Take 1 tablet (40 mg total) by mouth daily. 90 tablet 3   digoxin (LANOXIN) 0.125 MG tablet Take 1 tablet (0.125 mg total) by mouth every other day. 45 tablet 1   furosemide (LASIX) 20 MG tablet Take 1 tablet (20 mg total) by mouth daily. 30 tablet 3   hydrocerin (EUCERIN) CREA Apply 1 application topically 2 (two) times daily. 228 g 0   losartan (COZAAR) 25 MG tablet Take 0.5 tablets (12.5 mg total) by mouth daily. 45 tablet 1   spironolactone (ALDACTONE) 25 MG tablet Take 1 tablet (25 mg total) by mouth daily. HF FUND (Patient taking differently: Take 25 mg by mouth at bedtime. HF FUND) 45 tablet 1   traMADol (ULTRAM) 50 MG tablet Take 1 tablet (50 mg total) by mouth every 6 (six) hours as needed for moderate pain. (Patient not taking: Reported on 02/27/2020) 28 tablet 0   No current facility-administered medications for this encounter.    Allergies  Allergen Reactions   Other Other (See Comments)    No blood products      Social History   Socioeconomic History   Marital status: Single    Spouse name: Not on file   Number of children: Not on file   Years of education: Not on file   Highest education level: Not on file  Occupational History  Not on file  Tobacco Use   Smoking status: Never Smoker   Smokeless tobacco: Never Used  Substance and Sexual Activity   Alcohol use: Yes   Drug use: Not on file   Sexual activity: Not on file  Other Topics Concern   Not on file  Social History Narrative   Not on file   Social Determinants of Health   Financial Resource Strain: Low Risk    Difficulty of Paying Living Expenses: Not very hard  Food Insecurity: No Food Insecurity   Worried About Running Out of Food in the Last Year: Never true   Ran Out of Food in the Last Year: Never true  Transportation Needs: No Transportation Needs   Lack of Transportation (Medical): No    Lack of Transportation (Non-Medical): No  Physical Activity:    Days of Exercise per Week:    Minutes of Exercise per Session:   Stress:    Feeling of Stress :   Social Connections:    Frequency of Communication with Friends and Family:    Frequency of Social Gatherings with Friends and Family:    Attends Religious Services:    Active Member of Clubs or Organizations:    Attends Banker Meetings:    Marital Status:   Intimate Partner Violence:    Fear of Current or Ex-Partner:    Emotionally Abused:    Physically Abused:    Sexually Abused:       Family History  Problem Relation Age of Onset   Gout Sister     Vitals:   03/12/20 1034  BP: 128/88  Pulse: (!) 103  SpO2: 97%  Weight: 102.8 kg (226 lb 9.6 oz)    Wt Readings from Last 3 Encounters:  03/12/20 102.8 kg (226 lb 9.6 oz)  03/05/20 101 kg (222 lb 11.2 oz)  02/28/20 100.2 kg (221 lb)   PHYSICAL EXAM: General:   No resp difficulty HEENT: normal Neck: supple. JVP 9-10 . Carotids 2+ bilat; no bruits. No lymphadenopathy or thryomegaly appreciated. Cor: PMI nondisplaced. Regular rate & rhythm. No rubs, gallops or murmurs. Lungs: clear Abdomen: soft, nontender, nondistended. No hepatosplenomegaly. No bruits or masses. Good bowel sounds. Extremities: no cyanosis, clubbing, rash, R and LLE 2+ erythema Neuro: alert & orientedx3, cranial nerves grossly intact. moves all 4 extremities w/o difficulty. Affect pleasant   EKG: A flutter 101 bpm    ASSESSMENT & PLAN:  1. Acute/Chronic Systolic Heart Failure - ECHO EF 15% with severe global HK, and moderate RV dysfunction.  Etiology possible ETOH versus HTN (denies severe ETOH use). TSH was ok. HIV NR  - 4/28 LHC with severe 1v CAD. RHC with preserved cardiac output.  - S/p CABG x 1 w/ LIMA-LAD 5/4 -NYHA II. Functionally doing ok.  Volume overloaded. Give 40 mg IV lasix twice daily.   - Continue spiro 12.5 and losartan 12.5. - continue digoxin  0.125 mg.  - Concerned for tachy/brady syndrome. So will hold off on b blocker for now.   2. CAD:  - LHC 12/2019 with severe 1VCAD - S/p CABG x 1 w/ LIMA-LAD 01/30/20 - No chest pain.  - on ASA 81 + statin, atorvastatin 40 mg  3. Bilateral lower extremity cellulitis - PCP 5/21 started Keflex x 7 days --> now with bilateral cellulitis.  - Discussed with pharmacy. Place on Vancomycin.   4. Atrial Flutter  - had post operative Afib/Flutter post CABG s/p TEE/DCCV - Post op he went back in A flutter.  Had been on dig + amio 400/400 but developed bradycardia so dig was stopped and amio was cut back to 200 mg daily.  - He was set up for cardioversion on 5/28 but had chemically converted. -Had been on dig + amio 200/200 but developed bradycardia so dig was stopped and amio was cut back to 200 mg daily on 6/1.   - Today he is asymptomatic but back in  A flutter 101 bpm.  - Will increase amio to 200 mg twice a day.  - Continue eliquis 5 mg twice a day.  - Refer to EP.  - Set up DC-CV after diuresed.    -Admit to Progressive Care Unit.  - Will need IV diuresis + IV antibiotics.  - Check labs.     Darrick Grinder, NP 03/12/20

## 2020-03-12 NOTE — Progress Notes (Signed)
Paramedicine Encounter   Patient ID: Corey Brady , male,   DOB: July 15, 1956,63 y.o.,  MRN: [id number]  Corey Brady was seen at the HF clinic today and reported feeling well. He was found to be in atrial flutter today despite being compliant with his medications. Additionally his legs were red and swollen today. Per HF team he is being admitted for BLEE and arrhythmia.  I will follow up upon discharge from the hospital.   Jacqualine Code, EMT 03/12/2020   ACTION: Next visit planned for post discharge from hospital

## 2020-03-12 NOTE — H&P (Addendum)
This note serves as H&P and reflects work done today.   PCP: Patient, No Pcp Per PCP-Cardiologist: [name]   HPI:  64 y/o male, recently admitted to [facility] 01/23/20 w/ 3 month h/o progressive dyspnea, LEE, orthopnea and PND and diagnosed w/ acute CHF w/ massive volume overload/ anasarca, also in the setting of elevated BP. Diuresed and started on HF/CHF meds. Echo showed severely reduced LVEF 15% with severe global HK Moderate RV dysfunction, leading to [facility] which showed Severe 1v CAD with ostially occlusion of LAD with prominent R->L collaterals and good target and well preserved hemodynamics. TCTS was consulted and he underwent CABG x 1 w/ LIMA- LAD. Require milrinone post operatively. Weaned off w/ stable co-ox. Post op course also c/b atrial flutter requiring amiodarone + TEE guided DCCV back to NSR. 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   Evaluated in [facility] ED on 6/1 with bradycardia. Heart rate was in the 40s. Digoxin was stopped and amiodarone was cut back to once a day.   Today he returns for HF follow up.Overall feeling fine. Says he has noticed legs are getting red again and have started to swell. He completed 7 days antibiotics. Denies SOB/PND/Orthopnea. Appetite ok. No fever or chills. Weight at home 223 pounds. Taking all medications. Followed closely by HF Paramedicine.   EKG : Aflutter 101 bpm   Echo (pre CABG) 4/21: LVEF <20%. GIII. RV systolic function moderately reduced.         Past Medical History:  Diagnosis Date  . CHF (congestive heart failure) (HCC)   . Gout   . No blood products 01/27/2020          Current Outpatient Medications  Medication Sig Dispense Refill  . amiodarone (PACERONE) 200 MG tablet Take 200 mg by mouth daily.    Marland Kitchen apixaban (ELIQUIS) 5 MG TABS tablet Take 1 tablet (5 mg total) by mouth 2 (two) times daily. 180  tablet 3  . aspirin 81 MG chewable tablet Chew 1 tablet (81 mg total) by mouth daily.    Marland Kitchen atorvastatin (LIPITOR) 40 MG tablet Take 1 tablet (40 mg total) by mouth daily. 90 tablet 3  . digoxin (LANOXIN) 0.125 MG tablet Take 1 tablet (0.125 mg total) by mouth every other day. 45 tablet 1  . furosemide (LASIX) 20 MG tablet Take 1 tablet (20 mg total) by mouth daily. 30 tablet 3  . hydrocerin (EUCERIN) CREA Apply 1 application topically 2 (two) times daily. 228 g 0  . losartan (COZAAR) 25 MG tablet Take 0.5 tablets (12.5 mg total) by mouth daily. 45 tablet 1  . spironolactone (ALDACTONE) 25 MG tablet Take 1 tablet (25 mg total) by mouth daily. HF FUND (Patient taking differently: Take 25 mg by mouth at bedtime. HF FUND) 45 tablet 1  . traMADol (ULTRAM) 50 MG tablet Take 1 tablet (50 mg total) by mouth every 6 (six) hours as needed for moderate pain. (Patient not taking: Reported on 02/27/2020) 28 tablet 0   No current facility-administered medications for this encounter.         Allergies  Allergen Reactions  . Other Other (See Comments)    No blood products      Social History        Socioeconomic History  . Marital status: Single    Spouse name: Not on file  . Number of children: Not  on file  . Years of education: Not on file  . Highest education level: Not on file  Occupational History  . Not on file  Tobacco Use  . Smoking status: Never Smoker  . Smokeless tobacco: Never Used  Substance and Sexual Activity  . Alcohol use: Yes  . Drug use: Not on file  . Sexual activity: Not on file  Other Topics Concern  . Not on file  Social History Narrative  . Not on file   Social Determinants of Health      Financial Resource Strain: Low Risk   . Difficulty of Paying Living Expenses: Not very hard  Food Insecurity: No Food Insecurity  . Worried About Programme researcher, broadcasting/film/video in the Last Year: Never true  . Ran Out of Food in the Last Year: Never true  Transportation  Needs: No Transportation Needs  . Lack of Transportation (Medical): No  . Lack of Transportation (Non-Medical): No  Physical Activity:   . Days of Exercise per Week:   . Minutes of Exercise per Session:   Stress:   . Feeling of Stress :   Social Connections:   . Frequency of Communication with Friends and Family:   . Frequency of Social Gatherings with Friends and Family:   . Attends Religious Services:   . Active Member of Clubs or Organizations:   . Attends Banker Meetings:   Marland Kitchen Marital Status:   Intimate Partner Violence:   . Fear of Current or Ex-Partner:   . Emotionally Abused:   Marland Kitchen Physically Abused:   . Sexually Abused:            Family History  Problem Relation Age of Onset  . Gout Sister        Vitals:   03/12/20 1034  BP: 128/88  Pulse: (!) 103  SpO2: 97%  Weight: 102.8 kg (226 lb 9.6 oz)       Wt Readings from Last 3 Encounters:  03/12/20 102.8 kg (226 lb 9.6 oz)  03/05/20 101 kg (222 lb 11.2 oz)  02/28/20 100.2 kg (221 lb)   PHYSICAL EXAM: General:   No resp difficulty HEENT: normal Neck: supple. JVP 9-10 . Carotids 2+ bilat; no bruits. No lymphadenopathy or thryomegaly appreciated. Cor: PMI nondisplaced. Regular rate & rhythm. No rubs, gallops or murmurs. Lungs: clear Abdomen: soft, nontender, nondistended. No hepatosplenomegaly. No bruits or masses. Good bowel sounds. Extremities: no cyanosis, clubbing, rash, 2+ lower extremity edema with erythema.  Neuro: alert & orientedx3, cranial nerves grossly intact. moves all 4 extremities w/o difficulty. Affect pleasant   EKG: A flutter 101 bpm    ASSESSMENT & PLAN:  1. Acute/Chronic Systolic Heart Failure - ECHO EF 15% with severe global HK, and moderate RV dysfunction. Etiology possible ETOH versus HTN (denies severe ETOH use). TSH was ok. HIV NR  - 4/28 LHC with severe 1v CAD. RHC with preserved cardiac output.  - S/p CABG x 1 w/ LIMA-LAD 5/4 -NYHA II. Functionally doing  ok.  Volume overloaded. Give 40 mg IV lasix twice daily.   - Continue spiro 12.5 and losartan 12.5. - continue digoxin 0.125 mg.  - Concerned for tachy/brady syndrome. So will hold off on b blocker for now.   2. CAD:  - LHC 12/2019 with severe 1VCAD - S/p CABG x 1 w/ LIMA-LAD 01/30/20 - No chest pain.  - on ASA 81 + statin, atorvastatin 40 mg  3. Bilateral lower extremity cellulitis - PCP 5/21 started Keflex x 7  days --> now with bilateral cellulitis.  - Discussed with pharmacy. Place on Vancomycin.   4. Atrial Flutter  - had post operative Afib/Flutter post CABG s/p TEE/DCCV - Post op he went back in A flutter. Had been on dig + amio 400/400 but developed bradycardia so dig was stopped and amio was cut back to 200 mg daily.  - He was set up for cardioversion on 5/28 but had chemically converted. -Had been on dig + amio 200/200 but developed bradycardia so dig was stopped and amio was cut back to 200 mg daily on 6/1.   - Today he is asymptomatic but back in  A flutter 101 bpm.  - Will increase amio to 200 mg twice a day.  - Continue eliquis 5 mg twice a day.  - Refer to EP.  - Set up DC-CV after diuresed.    -Admit to Progressive Care Unit.  - Will need IV diuresis + IV antibiotics.  - Check labs.   Amy Clegg NP-C  1:14 PM   Patient seen and examined with the above-signed Advanced Practice Provider and/or Housestaff. I personally reviewed laboratory data, imaging studies and relevant notes. I independently examined the patient and formulated the important aspects of the plan. I have edited the note to reflect any of my changes or salient points. I have personally discussed the plan with the patient and/or family.  64 y/o male with CAD, severe systolic HF and PAFL. Recently underwent CABG. Post-op course c/b low output and PAFL requiring several DC-CVs  Now presents with worsening LE edema, probable cellulitis and recurrent PAFL. Amio recently cut back due to  bradycardia.  General:  Weak appearing. HEENT: normal Neck: supple. JVP to jaw. Carotids 2+ bilat; no bruits. No lymphadenopathy or thryomegaly appreciated. Cor: PMI nondisplaced. irregular rate & rhythm. No rubs, gallops or murmurs. Lungs: clear Abdomen: soft, nontender, nondistended. No hepatosplenomegaly. No bruits or masses. Good bowel sounds. Extremities: no cyanosis, clubbing, rash, 3+ edema with bilateral erythema Neuro: alert & orientedx3, cranial nerves grossly intact. moves all 4 extremities w/o difficulty. Affect pleasant  Will admit for IV diuresis. IV abx and DC-CV vs ablation.Would continue amiodarone though he is at risk for post-termination pauses with underlying SSS. Not candidate for PPM at this time with current cellulitis.   Glori Bickers, MD  6:23 PM

## 2020-03-12 NOTE — Progress Notes (Signed)
  Echocardiogram 2D Echocardiogram has been performed.  Delcie Roch 03/12/2020, 5:48 PM

## 2020-03-13 DIAGNOSIS — L03115 Cellulitis of right lower limb: Secondary | ICD-10-CM

## 2020-03-13 DIAGNOSIS — L03116 Cellulitis of left lower limb: Secondary | ICD-10-CM

## 2020-03-13 LAB — BASIC METABOLIC PANEL
Anion gap: 11 (ref 5–15)
BUN: 15 mg/dL (ref 8–23)
CO2: 26 mmol/L (ref 22–32)
Calcium: 9.3 mg/dL (ref 8.9–10.3)
Chloride: 103 mmol/L (ref 98–111)
Creatinine, Ser: 1.06 mg/dL (ref 0.61–1.24)
GFR calc Af Amer: >60 mL/min (ref >60)
GFR calc non Af Amer: >60 mL/min (ref >60)
Glucose, Bld: 92 mg/dL (ref 70–99)
Potassium: 4.7 mmol/L (ref 3.5–5.1)
Sodium: 140 mmol/L (ref 135–145)

## 2020-03-13 LAB — CBC
HCT: 41.1 % (ref 39.0–52.0)
Hemoglobin: 13.5 g/dL (ref 13.0–17.0)
MCH: 30 pg (ref 26.0–34.0)
MCHC: 32.8 g/dL (ref 30.0–36.0)
MCV: 91.3 fL (ref 80.0–100.0)
Platelets: 193 10*3/uL (ref 150–400)
RBC: 4.5 MIL/uL (ref 4.22–5.81)
RDW: 14.5 % (ref 11.5–15.5)
WBC: 7.3 10*3/uL (ref 4.0–10.5)
nRBC: 0 % (ref 0.0–0.2)

## 2020-03-13 LAB — DIGOXIN LEVEL: Digoxin Level: 0.3 ng/mL — ABNORMAL LOW (ref 0.8–2.0)

## 2020-03-13 LAB — MAGNESIUM: Magnesium: 1.8 mg/dL (ref 1.7–2.4)

## 2020-03-13 MED ORDER — MAGNESIUM SULFATE 2 GM/50ML IV SOLN
2.0000 g | Freq: Once | INTRAVENOUS | Status: AC
  Administered 2020-03-13: 2 g via INTRAVENOUS
  Filled 2020-03-13: qty 50

## 2020-03-13 NOTE — Progress Notes (Signed)
Community paramedic requested CSW meet with pt and pt brother during clinic visit to answer some questions for the pts brother regarding some bills and assistance applications he was applying for.  Pt brother has lots of hospital bills with him he was requesting help with and CSW explained he would need to be in contact with billing department at [facility] to discuss payment plan to avoid going to collections.  Pt had been screened for Medicaid during previous hospital stay and they think he might have been over reserves but they proceeded with submitting application anyway so that is currently in process- brother reports they got some information from DSS but he didn't bring it with him- CSW encouraged him to look at it and call me if he needs help filling anything out.  From the pt reports just sounds like he is slightly over reserves (has $5000 in a 401K account) so will hopefully be able to spend down to qualify.  Pt and brother also working on a CAFA- received notice that he still needs to send in non filing form and 3 months bank statements.  CSW had pt fill out non filing request and faxed into IRS.  Pt brother will help get last 3 bank statements.  CSW attempted to call financial counselor working with pt so we can be in contact and avoid pt being denied which would make him ineligible to apply for another 6 months- left message requesting return call  CSW will continue to follow and assist as needed  Burna Sis, LCSW Clinical Social Worker Advanced Heart Failure Clinic Desk#: [phone] Cell#: [phone]

## 2020-03-13 NOTE — Consult Note (Addendum)
Cardiology Consultation:   Patient ID: Corey Brady MRN: [id number]; DOB: 1956/04/08  Admit date: 03/12/2020 Date of Consult: 03/13/2020  Primary Care Provider: Patient, No Pcp Per [facility] Cardiologist: Dr. Lenoria Chime [facility] Electrophysiologist:  None    Patient Profile:   Corey Brady is a 64 y.o. male with a hx of CAD, CM (possible ETOH), HTN who is being seen today for the evaluation of AFlutter, rhythm management at the request of [name].  History of Present Illness:   Mr. Corey Brady recently admitted to [facility] 01/23/20 w/ 3 month h/o progressive dyspnea, LEE, orthopnea and PND and diagnosed w/ acute CHF w/ massive volume overload/ anasarca, also in the setting of elevated BP. Diuresed and started on HF/CHF meds. Echo showed severely reduced LVEF 15% with severe global HK Moderate RV dysfunction, leading to [facility] which showed Severe 1v CAD with ostially occlusion of LAD with prominent R->L collaterals and good target and well preserved hemodynamics.  TCTS was consulted and he underwent CABG x 1 w/ LIMA- LAD. Require milrinone post operatively. Weaned off w/ stable co-ox.  Post op course also c/b atrial flutter requiring amiodarone + TEE guided DCCV back to NSR. 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   He had recurrent flutter, planned for DCCV though converted with meds in May  Evaluated in [facility] ED on 6/1 with bradycardia. Heart rate was in the 40s. Digoxin was stopped and amiodarone was cut back to once a day  He was admitted to [facility] yesterday with increasing LE swelling and erythema, started IV du=iuretics and abx for cellulitis  Noted in AFlutter and EP is asked to weigh in on management with h/o RVR and bradycardia requiring reduction in meds. Noting not currently on BB with concerns of prior bradycardia  LABS K+ 4.7 Mag 1.8 BUN/Creat 15/1.06 WBC  7.3 H/H 13/41 Plts 193  TSH 4.165 COVID neg  Rate/rhythm meds: Dig 0.125mg  QOD Amiodarone 200mg  QD     He is feeling well, legs are less uncomfortable.  He denies any SOB or DOE, no dizzy spells, near syncope or syncope. He is largely unaware of his rhythm, denies palpitations or cardiac awareness.     Past Medical History:  Diagnosis Date  . CHF (congestive heart failure) (HCC)   . Gout   . No blood products 01/27/2020    Past Surgical History:  Procedure Laterality Date  . CARDIOVERSION N/A 02/06/2020   Procedure: CARDIOVERSION;  Surgeon: 04/07/2020, MD;  Location: [facility] ENDOSCOPY;  Service: Cardiovascular;  Laterality: N/A;  . CORONARY ARTERY BYPASS GRAFT N/A 01/30/2020   Procedure: OFF PUMP CORONARY ARTERY BYPASS GRAFTING (CABG) TIMES ONE;  Surgeon: 03/31/2020, MD;  Location: [facility] OR;  Service: Open Heart Surgery;  Laterality: N/A;  swan only  . RIGHT/LEFT HEART CATH AND CORONARY ANGIOGRAPHY N/A 01/26/2020   Procedure: RIGHT/LEFT HEART CATH AND CORONARY ANGIOGRAPHY;  Surgeon: 01/28/2020, MD;  Location: [facility] INVASIVE CV LAB;  Service: Cardiovascular;  Laterality: N/A;  . TEE WITHOUT CARDIOVERSION N/A 01/30/2020   Procedure: TRANSESOPHAGEAL ECHOCARDIOGRAM (TEE);  Surgeon: 03/31/2020, MD;  Location: [facility] OR;  Service: Open Heart Surgery;  Laterality: N/A;  . TEE WITHOUT CARDIOVERSION N/A 02/06/2020   Procedure: TRANSESOPHAGEAL ECHOCARDIOGRAM (TEE);  Surgeon: 04/07/2020, MD;  Location: [facility] ENDOSCOPY;  Service: Cardiovascular;  Laterality: N/A;     Home Medications:  Prior to  Admission medications   Medication Sig Start Date End Date Taking? Authorizing Provider  amiodarone (PACERONE) 200 MG tablet Take 200 mg by mouth daily.   Yes [provider]  apixaban (ELIQUIS) 5 MG TABS tablet Take 1 tablet (5 mg total) by mouth 2 (two) times daily. 02/16/20  Yes Fulp, Cammie, MD  aspirin 81 MG chewable tablet Chew 1 tablet (81 mg total) by mouth  daily. 02/09/20  Yes Gold, Wayne E, PA-C  atorvastatin (LIPITOR) 40 MG tablet Take 1 tablet (40 mg total) by mouth daily. 03/05/20  Yes Lyda Jester M, PA-C  furosemide (LASIX) 20 MG tablet Take 1 tablet (20 mg total) by mouth daily. 02/19/20  Yes Simmons, Brittainy M, PA-C  hydrocerin (EUCERIN) CREA Apply 1 application topically 2 (two) times daily. 02/08/20  Yes Gold, Wayne E, PA-C  losartan (COZAAR) 25 MG tablet Take 0.5 tablets (12.5 mg total) by mouth daily. 03/05/20  Yes Lyda Jester M, PA-C  spironolactone (ALDACTONE) 25 MG tablet Take 1 tablet (25 mg total) by mouth daily. HF FUND Patient taking differently: Take 25 mg by mouth at bedtime. HF FUND 02/20/20  Yes Lyda Jester M, PA-C  traMADol (ULTRAM) 50 MG tablet Take 1 tablet (50 mg total) by mouth every 6 (six) hours as needed for moderate pain. 02/08/20  Yes Gold, Wayne E, PA-C  digoxin (LANOXIN) 0.125 MG tablet Take 1 tablet (0.125 mg total) by mouth every other day. 03/05/20   Consuelo Pandy, PA-C    Inpatient Medications: Scheduled Meds: . amiodarone  200 mg Oral BID  . apixaban  5 mg Oral BID  . aspirin  81 mg Oral Daily  . atorvastatin  40 mg Oral Daily  . digoxin  0.125 mg Oral QODAY  . furosemide  40 mg Intravenous BID  . losartan  12.5 mg Oral Daily  . sodium chloride flush  3 mL Intravenous Q12H  . spironolactone  25 mg Oral QHS   Continuous Infusions: . sodium chloride    . magnesium sulfate bolus IVPB    . vancomycin 750 mg (03/13/20 0312)   PRN Meds: sodium chloride, acetaminophen, ondansetron (ZOFRAN) IV, sodium chloride flush  Allergies:    Allergies  Allergen Reactions  . Other Other (See Comments)    No blood products    Social History:   Social History   Socioeconomic History  . Marital status: Single    Spouse name: Not on file  . Number of children: Not on file  . Years of education: Not on file  . Highest education level: Not on file  Occupational History  . Not on file    Tobacco Use  . Smoking status: Never Smoker  . Smokeless tobacco: Never Used  Substance and Sexual Activity  . Alcohol use: Yes  . Drug use: Not on file  . Sexual activity: Not on file  Other Topics Concern  . Not on file  Social History Narrative  . Not on file   Social Determinants of Health   Financial Resource Strain: Low Risk   . Difficulty of Paying Living Expenses: Not very hard  Food Insecurity: No Food Insecurity  . Worried About Charity fundraiser in the Last Year: Never true  . Ran Out of Food in the Last Year: Never true  Transportation Needs: No Transportation Needs  . Lack of Transportation (Medical): No  . Lack of Transportation (Non-Medical): No  Physical Activity:   . Days of Exercise per Week:   . Minutes  of Exercise per Session:   Stress:   . Feeling of Stress :   Social Connections:   . Frequency of Communication with Friends and Family:   . Frequency of Social Gatherings with Friends and Family:   . Attends Religious Services:   . Active Member of Clubs or Organizations:   . Attends Banker Meetings:   Marland Kitchen Marital Status:   Intimate Partner Violence:   . Fear of Current or Ex-Partner:   . Emotionally Abused:   Marland Kitchen Physically Abused:   . Sexually Abused:     Family History:   Family History  Problem Relation Age of Onset  . Gout Sister      ROS:  Please see the history of present illness.  All other ROS reviewed and negative.     Physical Exam/Data:   Vitals:   03/13/20 0506 03/13/20 0822 03/13/20 1033 03/13/20 1223  BP: 98/71 116/78 93/67 (!) 119/59  Pulse: 67 (!) 118 86 78  Resp: 18 18 18 19   Temp: 97.8 F (36.6 C) 97.9 F (36.6 C) 97.7 F (36.5 C) 98.1 F (36.7 C)  TempSrc: Oral Oral Oral Oral  SpO2: 98% 98% 96% 95%  Weight: 98.3 kg     Height:        Intake/Output Summary (Last 24 hours) at 03/13/2020 1253 Last data filed at 03/13/2020 1034 Gross per 24 hour  Intake 987.22 ml  Output 4920 ml  Net -3932.78 ml    Last 3 Weights 03/13/2020 03/12/2020 03/12/2020  Weight (lbs) 216 lb 11.2 oz 223 lb 6.4 oz 226 lb 9.6 oz  Weight (kg) 98.294 kg 101.334 kg 102.785 kg     Body mass index is 29.39 kg/m.  General:  Well nourished, well developed, in no acute distress HEENT: normal Lymph: no adenopathy Neck: +8 JVD Endocrine:  No thryomegaly Vascular: No carotid bruits  Cardiac:  irreg-irreg; no murmurs, gallops or rubs Lungs:  CTA b/l, no wheezing, rhonchi or rales  Abd: soft, nontender  Ext: 1++ edema erythema b/l LE edema Musculoskeletal:  No deformities Skin: warm and dry  Neuro:  no focal abnormalities noted Psych:  Normal affect   EKG:  The EKG was personally reviewed and demonstrates:   Aflutter (atypical), 101bpm, diffuse T changes 9appear ucnhanged)  02/27/2020: SB 39bpm 02/19/2020 AFlutter (atypical) 77pm 02/06/2020 read as SR, though is slightly irregular and looks like an  AFlutter 01/23/2020 SR 100bpm,   Telemetry:  Telemetry was personally reviewed and demonstrates:    AFlutter rates 80's-110's  Relevant CV Studies:  03/12/2020: TTE IMPRESSIONS  1. Left ventricular ejection fraction, by estimation, is 25 to 30%. The  left ventricle has severely decreased function. The left ventricle  demonstrates regional wall motion abnormalities (see scoring  diagram/findings for description). The left  ventricular internal cavity size was moderately to severely dilated. Left  ventricular diastolic function could not be evaluated. Extensive  infarction in the LAD artery distribution and global hypokinesis.  2. Right ventricular systolic function is mildly reduced. The right  ventricular size is normal. There is mildly elevated pulmonary artery  systolic pressure. The estimated right ventricular systolic pressure is  37.3 mmHg.  3. Left atrial size was moderately dilated.  4. Right atrial size was moderately dilated.  5. The mitral valve is normal in structure. Trivial mitral valve   regurgitation.  6. The aortic valve is normal in structure. Aortic valve regurgitation is  not visualized.   Comparison(s): The left ventricular function has improved.  02/06/2020: TEE. LVEF 15-20% 01/23/2020: TTE, LVEF <20%, grade III DD, RV  Mod reduced function  Laboratory Data:  High Sensitivity Troponin:  No results for input(s): TROPONINIHS in the last 720 hours.   Chemistry Recent Labs  Lab 03/12/20 1127 03/13/20 0357  NA 136 140  K 4.4 4.7  CL 102 103  CO2 26 26  GLUCOSE 100* 92  BUN 17 15  CREATININE 1.06 1.06  CALCIUM 9.4 9.3  GFRNONAA >60 >60  GFRAA >60 >60  ANIONGAP 8 11    Recent Labs  Lab 03/12/20 1127  PROT 7.7  ALBUMIN 3.9  AST 33  ALT 28  ALKPHOS 87  BILITOT 0.7   Hematology Recent Labs  Lab 03/12/20 1127 03/13/20 0357  WBC 7.8 7.3  RBC 4.49 4.50  HGB 13.6 13.5  HCT 41.9 41.1  MCV 93.3 91.3  MCH 30.3 30.0  MCHC 32.5 32.8  RDW 14.4 14.5  PLT 214 193   BNP Recent Labs  Lab 03/12/20 1127  BNP 533.8*    DDimer No results for input(s): DDIMER in the last 168 hours.   Radiology/Studies:     Assessment and Plan:   1. Aflutter     Looks atypical, one of his EKGs I suspect is a different flutter as well     There has been no description/mention of Afib     CHA2DS2Vasc is 3, on eliquis, appropriately dosed  2 issues are being raised/asked  He is a [employer]'s whitness and given refusal of blood products felt a poor long term a/c candidate And rhythm control strategy with some concerns of tachy-brady  Most recent echo with some slight improvement, LVEF to 25-30% and RV mildly reduced LA described as mod enlarged (previously severely), measured 74mm  His amio has been increased to BID here Has been on QOD dig  While his LVEF has not improved much, is slightly better Not on BB (with concerns of prior bradycardia) Seems CM not felt to be ischemic though did have CABG 01/30/2020  Jalon Blackwelder discuss with [name]  Ablation would  not be straightforward given atypical flutter  Suspect he Zymire Turnbo have more atrial arrhythmias and likely some Afib in the future, I think long term a/c is needed despite his being Jehova's witness.  Can we stop dig and start BB and if no improvement in LVEF and if no ongoing skin infections would be candidate for ICD and have pacing support to allow more aggressive AAD/medication management.   [name] has seen the patient Discussed possibility of ablation Friday pending extent of improvement in LE cellulitis and if still in Aflutter The patient states he has been compliant with his Eliquis and has not missed any doses in the last 3 weeks His full recommendations to follow     For questions or updates, please contact [facility] Please consult [email or web address] for contact info under    Signed, Sheilah Pigeon, PA-C  03/13/2020 12:53 PM  I have seen and examined this patient with Francis Dowse.  Agree with above, note added to reflect my findings.  On exam, tachycardic, irregular.  Patient has had significant issues with atrial flutter in the past.  He is on amiodarone but has become bradycardic in the past and thus amiodarone dose has been reduced.  He also has chronic systolic heart failure and a single-vessel CABG.  He continues to have issues with atrial flutter.  In reviewing his atrial flutter, it does appear that he has multiple flutter circuits, though  these could all be either clockwise or counterclockwise isthmus dependent flutter.  He would likely benefit from ablation to get off of amiodarone and anticoagulation if at all possible.  He does have a cellulitis and is in heart failure.  We Altovise Wahler continue to evaluate his progress to see about the timing of ablation.  During my exam, the patient was quite drowsy, and I do not feel like he completely understood the conversation.  Hargun Spurling M. Destanie Tibbetts MD 03/14/2020 6:46 AM

## 2020-03-13 NOTE — Addendum Note (Signed)
Encounter addended by: Burna Sis, LCSW on: 03/13/2020 1:04 PM  Actions taken: Clinical Note Signed

## 2020-03-13 NOTE — Care Management (Signed)
03-13-20 1624 Patient is without insurance at this time. Patient states he does not have a primary care provider (PCP). Patient is agreeable for Case Manager to call the [facility] to establish an appointment. Information will be placed on the AVS. Patient states he will have transportation to and from appointments. Patient will need to utilize the [facility] for medications prior to transition home. Case Manager will follow for MATCH. No further needs at this time. Gala Lewandowsky, RN,BSN Case Manager

## 2020-03-13 NOTE — Progress Notes (Addendum)
   03/13/20 1712  Assess: MEWS Score  Temp 97.9 F (36.6 C)  BP 116/78  Pulse Rate (!) 118  ECG Heart Rate (!) 119  Resp 18  Level of Consciousness Alert  SpO2 98 %  O2 Device Room Air  Assess: MEWS Score  MEWS Temp 0  MEWS Systolic 0  MEWS Pulse 2  MEWS RR 0  MEWS LOC 0  MEWS Score 2  MEWS Score Color Yellow  Assess: if the MEWS score is Yellow or Red  Were vital signs taken at a resting state? Yes  Focused Assessment Documented focused assessment  Early Detection of Sepsis Score *See Row Information* Low  MEWS guidelines implemented *See Row Information* Yes  Treat  MEWS Interventions Administered scheduled meds/treatments  Take Vital Signs  Increase Vital Sign Frequency  Yellow: Q 2hr X 2 then Q 4hr X 2, if remains yellow, continue Q 4hrs  Escalate  MEWS: Escalate Yellow: discuss with charge nurse/RN and consider discussing with provider and RRT  Notify: Charge Nurse/RN  Name of Charge Nurse/RN Notified Kristen, RN  Date Charge Nurse/RN Notified 03/13/20  Time Charge Nurse/RN Notified 0831  Pt MEW changed to yellow. Protocol implemented.

## 2020-03-13 NOTE — Progress Notes (Addendum)
Advanced Heart Failure Rounding Note  PCP-Cardiologist: No primary care provider on file.    Patient Profile   64 y/o male w/ CAD, severe systolic HF and PAFL. Recently underwent CABG. Post-op course c/b low output and PAFL requiring several DC-CV. Amiodarone recently reduced due to bradycardia.   Now readmitted w/ a/c CHF, recurrent ALF and suspected LE cellulitis.   Subjective:    Remains in AFL, 80s-90s.   Good diuretics w/ IV Lasix, - 3.7L in UOP yesterday. Wt down 7 lb.   Remains on abx for LE cellulitis. AF. WBC normal.    Objective:   Weight Range: 98.3 kg Body mass index is 29.39 kg/m.   Vital Signs:   Temp:  [97.5 F (36.4 C)-98.2 F (36.8 C)] 97.7 F (36.5 C) (06/16 1033) Pulse Rate:  [67-118] 86 (06/16 1033) Resp:  [16-20] 18 (06/16 1033) BP: (93-124)/(63-79) 93/67 (06/16 1033) SpO2:  [96 %-100 %] 96 % (06/16 1033) Weight:  [98.3 kg-101.3 kg] 98.3 kg (06/16 0506) Last BM Date: 03/11/20  Weight change: Filed Weights   03/12/20 1340 03/13/20 0506  Weight: 101.3 kg 98.3 kg    Intake/Output:   Intake/Output Summary (Last 24 hours) at 03/13/2020 1152 Last data filed at 03/13/2020 1034 Gross per 24 hour  Intake 987.22 ml  Output 4920 ml  Net -3932.78 ml      Physical Exam    General:  Well appearing. No resp difficulty HEENT: Normal Neck: Supple. JVP ~8 cm . Carotids 2+ bilat; no bruits. No lymphadenopathy or thyromegaly appreciated. Cor: PMI nondisplaced. Irregular rhythm, regular rate. No rubs, gallops or murmurs. Lungs: Clear Abdomen: Soft, nontender, nondistended. No hepatosplenomegaly. No bruits or masses. Good bowel sounds. Extremities: No cyanosis, clubbing, rash, 1+ bilateral LEE. both LEs erythematous  Neuro: Alert & orientedx3, cranial nerves grossly intact. moves all 4 extremities w/o difficulty. Affect pleasant   Telemetry   Atrial flutter 80s-90s   EKG    No new EKG to review   Labs    CBC Recent Labs     03/12/20 1127 03/13/20 0357  WBC 7.8 7.3  HGB 13.6 13.5  HCT 41.9 41.1  MCV 93.3 91.3  PLT 214 193   Basic Metabolic Panel Recent Labs    10/93/23 1127 03/13/20 0357  NA 136 140  K 4.4 4.7  CL 102 103  CO2 26 26  GLUCOSE 100* 92  BUN 17 15  CREATININE 1.06 1.06  CALCIUM 9.4 9.3  MG  --  1.8   Liver Function Tests Recent Labs    03/12/20 1127  AST 33  ALT 28  ALKPHOS 87  BILITOT 0.7  PROT 7.7  ALBUMIN 3.9   No results for input(s): LIPASE, AMYLASE in the last 72 hours. Cardiac Enzymes No results for input(s): CKTOTAL, CKMB, CKMBINDEX, TROPONINI in the last 72 hours.  BNP: BNP (last 3 results) Recent Labs    01/23/20 0338 03/12/20 1127  BNP 1,370.5* 533.8*    ProBNP (last 3 results) No results for input(s): PROBNP in the last 8760 hours.   D-Dimer No results for input(s): DDIMER in the last 72 hours. Hemoglobin A1C No results for input(s): HGBA1C in the last 72 hours. Fasting Lipid Panel No results for input(s): CHOL, HDL, LDLCALC, TRIG, CHOLHDL, LDLDIRECT in the last 72 hours. Thyroid Function Tests Recent Labs    03/12/20 1127  TSH 4.165    Other results:   Imaging    ECHOCARDIOGRAM COMPLETE  Result Date: 03/12/2020    ECHOCARDIOGRAM  REPORT   Patient Name:   Corey Brady Date of Exam: 03/12/2020 Medical Rec #:  [id number]  Height:       72.0 in Accession #:    [id number] Weight:       223.4 lb Date of Birth:  Nov 15, 1955 BSA:          2.233 m Patient Age:    42 years   BP:           124/79 mmHg Patient Gender: M          HR:           79 bpm. Exam Location:  Inpatient Procedure: 2D Echo Indications:    congestive heart failure 428.0  History:        Patient has prior history of Echocardiogram examinations, most                 recent 02/06/2020. Prior CABG; Arrythmias:Atrial Fibrillation.  Sonographer:    Johny Chess Referring Phys: [phone] AMY D CLEGG IMPRESSIONS  1. Left ventricular ejection fraction, by estimation, is 25 to 30%. The left  ventricle has severely decreased function. The left ventricle demonstrates regional wall motion abnormalities (see scoring diagram/findings for description). The left ventricular internal cavity size was moderately to severely dilated. Left ventricular diastolic function could not be evaluated. Extensive infarction in the LAD artery distribution and global hypokinesis.  2. Right ventricular systolic function is mildly reduced. The right ventricular size is normal. There is mildly elevated pulmonary artery systolic pressure. The estimated right ventricular systolic pressure is 94.8 mmHg.  3. Left atrial size was moderately dilated.  4. Right atrial size was moderately dilated.  5. The mitral valve is normal in structure. Trivial mitral valve regurgitation.  6. The aortic valve is normal in structure. Aortic valve regurgitation is not visualized. Comparison(s): The left ventricular function has improved. FINDINGS  Left Ventricle: Left ventricular ejection fraction, by estimation, is 25 to 30%. The left ventricle has severely decreased function. The left ventricle demonstrates regional wall motion abnormalities. The left ventricular internal cavity size was moderately to severely dilated. There is no left ventricular hypertrophy. Left ventricular diastolic function could not be evaluated due to atrial fibrillation. Left ventricular diastolic function could not be evaluated.  LV Wall Scoring: The entire apex is dyskinetic. The mid anteroseptal segment and mid anterior segment are akinetic. The antero-lateral wall, inferior wall, posterior wall, basal anteroseptal segment, mid inferoseptal segment, basal anterior segment, and basal inferoseptal segment are hypokinetic. Extensive infarction in the LAD artery distribution and global hypokinesis. Right Ventricle: The right ventricular size is normal. No increase in right ventricular wall thickness. Right ventricular systolic function is mildly reduced. There is mildly  elevated pulmonary artery systolic pressure. The tricuspid regurgitant velocity  is 2.84 m/s, and with an assumed right atrial pressure of 5 mmHg, the estimated right ventricular systolic pressure is 54.6 mmHg. Left Atrium: Left atrial size was moderately dilated. Right Atrium: Right atrial size was moderately dilated. Pericardium: There is no evidence of pericardial effusion. Mitral Valve: The mitral valve is normal in structure. Trivial mitral valve regurgitation. Tricuspid Valve: The tricuspid valve is normal in structure. Tricuspid valve regurgitation is mild. Aortic Valve: The aortic valve is normal in structure. Aortic valve regurgitation is not visualized. Pulmonic Valve: The pulmonic valve was normal in structure. Pulmonic valve regurgitation is not visualized. Aorta: The aortic root is normal in size and structure. IAS/Shunts: No atrial level shunt detected by color flow Doppler.  LEFT VENTRICLE PLAX  2D LVIDd:         6.80 cm LVIDs:         5.80 cm LV PW:         1.20 cm LV IVS:        0.90 cm LVOT diam:     2.50 cm LVOT Area:     4.91 cm  IVC IVC diam: 2.60 cm LEFT ATRIUM             Index       RIGHT ATRIUM           Index LA diam:        4.70 cm 2.10 cm/m  RA Area:     22.10 cm LA Vol (A2C):   82.8 ml 37.08 ml/m RA Volume:   62.50 ml  27.99 ml/m LA Vol (A4C):   86.4 ml 38.69 ml/m LA Biplane Vol: 84.7 ml 37.93 ml/m   AORTA Ao Root diam: 3.20 cm Ao Asc diam:  3.30 cm TRICUSPID VALVE TR Peak grad:   32.3 mmHg TR Vmax:        284.00 cm/s  SHUNTS Systemic Diam: 2.50 cm Rachelle Hora Croitoru MD Electronically signed by Thurmon Fair MD Signature Date/Time: 03/12/2020/6:04:06 PM    Final       Medications:     Scheduled Medications: . amiodarone  200 mg Oral BID  . apixaban  5 mg Oral BID  . aspirin  81 mg Oral Daily  . atorvastatin  40 mg Oral Daily  . digoxin  0.125 mg Oral QODAY  . furosemide  40 mg Intravenous BID  . losartan  12.5 mg Oral Daily  . sodium chloride flush  3 mL Intravenous  Q12H  . spironolactone  25 mg Oral QHS     Infusions: . sodium chloride    . vancomycin 750 mg (03/13/20 0312)     PRN Medications:  sodium chloride, acetaminophen, ondansetron (ZOFRAN) IV, sodium chloride flush     Assessment/Plan   1.Acute/Chronic Systolic Heart Failure - ECHO EF 15% with severe global HK, and moderate RV dysfunction. Etiology possible ETOH versus HTN (denies severe ETOH use). TSH was ok. HIV NR  - 4/28 LHC with severe 1v CAD. RHC with preserved cardiac output.  - S/p CABG x 1 w/ LIMA-LAD 5/4 -NYHAII. Functionally doing ok but volume overloaded.  - Continue IV Lasix 40 mg bid   -Continuespiro 25 mg daily  - Continue losartan 12.5. - continue digoxin 0.125 mg. Dig level ok (0.3) -Concerned for tachy/brady syndrome. So will hold off on b blocker for now.  2. CAD:  - LHC 12/2019 with severe 1VCAD - S/p CABG x 1 w/ LIMA-LAD 01/30/20 - No chest pain. - on ASA 81 + statin, atorvastatin 40 mg  3.Bilateral lower extremity cellulitis - PCP 5/21startedKeflexx 7 days-->now with bilateral cellulitis.  - Discussed with pharmacy, Placed on Vancomycin. - AF. WBC normal. Continue abx therapy   4. Atrial Flutter  - had post operative Afib/Flutter post CABG s/p TEE/DCCV - Post op he went back in A flutter. Had been on dig + amio 400/400 but developed bradycardia so dig was stopped and amio was cut back to 200 mg daily. -He was set up for cardioversion on 5/28 but had chemically converted. -Had been on dig + amio 200/200 but developed bradycardia so dig was stopped and amio was cut back to 200 mg daily on 6/1.  - Now back in A flutter  - Amio increased back to 200 mg  twice a day.  -  Back on Digoxin 0.125 mg - Continue eliquis 5 mg twice a day.  - EP consulted. ? Potential ablation candidate  - Set up DC-CV after diuresed - Keep K > 4.0 and Mg > 2.0     Length of Stay: 1  Brittainy Simmons, PA-C  03/13/2020, 11:52 AM  Advanced Heart  Failure Team Pager [phone] (M-F; 7a - 4p)  Please contact [facility] for night-coverage after hours (4p -7a ) and weekends on [email or web address]  Patient seen and examined with the above-signed Advanced Practice Provider and/or Housestaff. I personally reviewed laboratory data, imaging studies and relevant notes. I independently examined the patient and formulated the important aspects of the plan. I have edited the note to reflect any of my changes or salient points. I have personally discussed the plan with the patient and/or family.  Remains tenuous. Diuresing well. Breathing better. Remains in AFL. Rate controlled. EP has seen. LEs very erythematous and warm. On vancomycin  General:  Weak appearing. No resp difficulty HEENT: normal Neck: supple. JVP 7-8. Carotids 2+ bilat; no bruits. No lymphadenopathy or thryomegaly appreciated. Cor: PMI nondisplaced. Irregular rate & rhythm. No rubs, gallops or murmurs. Lungs: clear Abdomen: soft, nontender, nondistended. No hepatosplenomegaly. No bruits or masses. Good bowel sounds. Extremities: no cyanosis, clubbing, rash, 2+ edema + erythema/calor Neuro: alert & orientedx3, cranial nerves grossly intact. moves all 4 extremities w/o difficulty. Affect pleasant  Continue diuresis. Place TED hose. Continue vancomycin. Continue Eliquis and po amio. Likely DC-CV on Friday. Await final EP input.   Arvilla Meres, MD  5:32 PM

## 2020-03-14 LAB — BASIC METABOLIC PANEL
Anion gap: 13 (ref 5–15)
BUN: 22 mg/dL (ref 8–23)
CO2: 26 mmol/L (ref 22–32)
Calcium: 9.4 mg/dL (ref 8.9–10.3)
Chloride: 98 mmol/L (ref 98–111)
Creatinine, Ser: 1.12 mg/dL (ref 0.61–1.24)
GFR calc Af Amer: >60 mL/min (ref >60)
GFR calc non Af Amer: >60 mL/min (ref >60)
Glucose, Bld: 95 mg/dL (ref 70–99)
Potassium: 4.6 mmol/L (ref 3.5–5.1)
Sodium: 137 mmol/L (ref 135–145)

## 2020-03-14 LAB — MAGNESIUM: Magnesium: 2.1 mg/dL (ref 1.7–2.4)

## 2020-03-14 MED ORDER — HYDROCERIN EX CREA
TOPICAL_CREAM | Freq: Two times a day (BID) | CUTANEOUS | Status: DC
  Administered 2020-03-16: 1 via TOPICAL
  Filled 2020-03-14 (×2): qty 113

## 2020-03-14 MED ORDER — DOXYCYCLINE HYCLATE 100 MG PO TABS
100.0000 mg | ORAL_TABLET | Freq: Two times a day (BID) | ORAL | Status: DC
  Administered 2020-03-14 – 2020-03-18 (×8): 100 mg via ORAL
  Filled 2020-03-14 (×8): qty 1

## 2020-03-14 NOTE — Consult Note (Addendum)
Cardiology Consultation:   Patient ID: Corey Brady MRN: [id number]; DOB: 04-Sep-1956  Admit date: 03/12/2020 Date of Consult: 03/14/2020  Primary Care Provider: Patient, No Pcp Per [facility] Cardiologist: Dr. Lenoria Ch[facility] Electrophysiologist:  None    Patient Profile:   Corey Brady is a 64 y.o. male with a hx of CAD, CM (possible ETOH), HTN who is being seen today for the evaluation of AFlutter, rhythm management at the request of [name].  History of Present Illness:   Corey Brady recently admitted to [facility] 01/23/20 w/ 3 month h/o progressive dyspnea, LEE, orthopnea and PND and diagnosed w/ acute CHF w/ massive volume overload/ anasarca, also in the setting of elevated BP. Diuresed and started on HF/CHF meds. Echo showed severely reduced LVEF 15% with severe global HK  Moderate RV dysfunction, leading to [facility] which showed Severe 1v CAD with ostially occlusion of LAD with prominent R->L collaterals and good target and well preserved hemodynamics.  TCTS was consulted and he underwent CABG x 1 w/ LIMA- LAD. Require milrinone post operatively. Weaned off w/ stable co-ox.  Post op course also c/b atrial flutter requiring amiodarone + TEE guided DCCV back to NSR. 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   He had recurrent flutter, planned for DCCV though converted with meds in May  Evaluated in [facility] ED on 6/1 with bradycardia. Heart rate was in the 40s. Digoxin was stopped and amiodarone was cut back to once a day  He was admitted to [facility] yesterday with increasing LE swelling and erythema, started IV du=iuretics and abx for cellulitis  Noted in AFlutter and EP is asked to weigh in on management with h/o RVR and bradycardia requiring reduction in meds. Noting not currently on BB with concerns of prior bradycardia  LABS K+ 4.7 Mag 1.8 BUN/Creat 15/1.06 WBC  7.3 H/H 13/41 Plts 193  TSH 4.165 COVID neg  Rate/rhythm meds: Dig 0.125mg  QOD Amiodarone 200mg  QD    Volume status has improved. Ready for AFL ablation tomorrow per HF team. He denies dyspnea or tachypalpitations currently. His LE cellulitis has also improved.    Past Medical History:  Diagnosis Date   CHF (congestive heart failure) (HCC)    Gout    No blood products 01/27/2020    Past Surgical History:  Procedure Laterality Date   CARDIOVERSION N/A 02/06/2020   Procedure: CARDIOVERSION;  Surgeon: 04/07/2020, MD;  Location: [facility] ENDOSCOPY;  Service: Cardiovascular;  Laterality: N/A;   CORONARY ARTERY BYPASS GRAFT N/A 01/30/2020   Procedure: OFF PUMP CORONARY ARTERY BYPASS GRAFTING (CABG) TIMES ONE;  Surgeon: 03/31/2020, MD;  Location: [facility] OR;  Service: Open Heart Surgery;  Laterality: N/A;  swan only   RIGHT/LEFT HEART CATH AND CORONARY ANGIOGRAPHY N/A 01/26/2020   Procedure: RIGHT/LEFT HEART CATH AND CORONARY ANGIOGRAPHY;  Surgeon: 01/28/2020, MD;  Location: [facility] INVASIVE CV LAB;  Service: Cardiovascular;  Laterality: N/A;   TEE WITHOUT CARDIOVERSION N/A 01/30/2020   Procedure: TRANSESOPHAGEAL ECHOCARDIOGRAM (TEE);  Surgeon: 03/31/2020, MD;  Location: [facility] OR;  Service: Open Heart Surgery;  Laterality: N/A;   TEE WITHOUT CARDIOVERSION N/A 02/06/2020   Procedure: TRANSESOPHAGEAL ECHOCARDIOGRAM (TEE);  Surgeon: 04/07/2020, MD;  Location: [facility] ENDOSCOPY;  Service: Cardiovascular;  Laterality: N/A;     Home Medications:  Prior to Admission medications   Medication Sig Start Date End Date Taking?  Authorizing Provider  amiodarone (PACERONE) 200 MG tablet Take 200 mg by mouth daily.   Yes [provider]  apixaban (ELIQUIS) 5 MG TABS tablet Take 1 tablet (5 mg total) by mouth 2 (two) times daily. 02/16/20  Yes Fulp, Cammie, MD  aspirin 81 MG chewable tablet Chew 1 tablet (81 mg total) by mouth daily. 02/09/20  Yes Gold, Wayne E, PA-C   atorvastatin (LIPITOR) 40 MG tablet Take 1 tablet (40 mg total) by mouth daily. 03/05/20  Yes Robbie Lis M, PA-C  furosemide (LASIX) 20 MG tablet Take 1 tablet (20 mg total) by mouth daily. 02/19/20  Yes Simmons, Brittainy M, PA-C  hydrocerin (EUCERIN) CREA Apply 1 application topically 2 (two) times daily. 02/08/20  Yes Gold, Wayne E, PA-C  losartan (COZAAR) 25 MG tablet Take 0.5 tablets (12.5 mg total) by mouth daily. 03/05/20  Yes Robbie Lis M, PA-C  spironolactone (ALDACTONE) 25 MG tablet Take 1 tablet (25 mg total) by mouth daily. HF FUND Patient taking differently: Take 25 mg by mouth at bedtime. HF FUND 02/20/20  Yes Robbie Lis M, PA-C  traMADol (ULTRAM) 50 MG tablet Take 1 tablet (50 mg total) by mouth every 6 (six) hours as needed for moderate pain. 02/08/20  Yes Gold, Wayne E, PA-C  digoxin (LANOXIN) 0.125 MG tablet Take 1 tablet (0.125 mg total) by mouth every other day. 03/05/20   Allayne Butcher, PA-C    Inpatient Medications: Scheduled Meds:  amiodarone  200 mg Oral BID   apixaban  5 mg Oral BID   aspirin  81 mg Oral Daily   atorvastatin  40 mg Oral Daily   digoxin  0.125 mg Oral QODAY   furosemide  40 mg Intravenous BID   losartan  12.5 mg Oral Daily   sodium chloride flush  3 mL Intravenous Q12H   spironolactone  25 mg Oral QHS   Continuous Infusions:  sodium chloride     vancomycin 750 mg (03/14/20 0318)   PRN Meds: sodium chloride, acetaminophen, ondansetron (ZOFRAN) IV, sodium chloride flush  Allergies:    Allergies  Allergen Reactions   Other Other (See Comments)    No blood products    Social History:   Social History   Socioeconomic History   Marital status: Single    Spouse name: Not on file   Number of children: Not on file   Years of education: Not on file   Highest education level: Not on file  Occupational History   Not on file  Tobacco Use   Smoking status: Never Smoker   Smokeless tobacco: Never Used  Substance and  Sexual Activity   Alcohol use: Yes   Drug use: Not on file   Sexual activity: Not on file  Other Topics Concern   Not on file  Social History Narrative   Not on file   Social Determinants of Health   Financial Resource Strain: Low Risk    Difficulty of Paying Living Expenses: Not very hard  Food Insecurity: No Food Insecurity   Worried About Running Out of Food in the Last Year: Never true   Ran Out of Food in the Last Year: Never true  Transportation Needs: No Transportation Needs   Lack of Transportation (Medical): No   Lack of Transportation (Non-Medical): No  Physical Activity:    Days of Exercise per Week:    Minutes of Exercise per Session:   Stress:    Feeling of Stress :   Social Connections:  Frequency of Communication with Friends and Family:    Frequency of Social Gatherings with Friends and Family:    Attends Religious Services:    Active Member of Clubs or Organizations:    Attends Engineer, structural:    Marital Status:   Intimate Partner Violence:    Fear of Current or Ex-Partner:    Emotionally Abused:    Physically Abused:    Sexually Abused:     Family History:   Family History  Problem Relation Age of Onset   Gout Sister      ROS:  Please see the history of present illness.  All other ROS reviewed and negative.     Physical Exam/Data:   Vitals:   03/13/20 1618 03/13/20 2222 03/14/20 0003 03/14/20 0432  BP: 124/63 110/70 121/74 100/69  Pulse: 87 64 76 72  Resp: 20 16 17 15   Temp: 97.8 F (36.6 C) 98.1 F (36.7 C) 98 F (36.7 C) 98 F (36.7 C)  TempSrc: Oral Oral Oral Oral  SpO2: 98% 98% 99% 99%  Weight:    97.2 kg  Height:        Intake/Output Summary (Last 24 hours) at 03/14/2020 1145 Last data filed at 03/14/2020 1100 Gross per 24 hour  Intake 1140 ml  Output 4225 ml  Net -3085 ml   Last 3 Weights 03/14/2020 03/13/2020 03/12/2020  Weight (lbs) 214 lb 4.8 oz 216 lb 11.2 oz 223 lb 6.4 oz  Weight (kg) 97.206 kg 98.294  kg 101.334 kg     Body mass index is 29.06 kg/m.    General:  Well appearing. No resp difficulty. HEENT: Normal Neck: Supple. JVP 5-6. Carotids 2+ bilat; no bruits. No thyromegaly or nodule noted. Cor: Irregular and somewhat tachy.  Lungs: CTAB, normal effort. Abdomen: Soft, non-tender, non-distended, no HSM. No bruits or masses. +BS   Extremities: No cyanosis and clubbing. Trace bilateral edema. + ted hoses.Rash reportedly improving  Neuro: Alert & orientedx3, cranial nerves grossly intact. moves all 4 extremities w/o difficulty. Affect pleasant   EKG:  No new EKGs.   02/27/2020: SB 39bpm 02/19/2020 AFlutter (atypical) 77pm 02/06/2020 read as SR, though is slightly irregular and looks like an  AFlutter 01/23/2020 SR 100bpm,   Telemetry:  Atrial flutter 80-110s, personally reviewed.   Relevant CV Studies:  03/12/2020: TTE IMPRESSIONS   1. Left ventricular ejection fraction, by estimation, is 25 to 30%. The  left ventricle has severely decreased function. The left ventricle  demonstrates regional wall motion abnormalities (see scoring  diagram/findings for description). The left  ventricular internal cavity size was moderately to severely dilated. Left  ventricular diastolic function could not be evaluated. Extensive  infarction in the LAD artery distribution and global hypokinesis.   2. Right ventricular systolic function is mildly reduced. The right  ventricular size is normal. There is mildly elevated pulmonary artery  systolic pressure. The estimated right ventricular systolic pressure is  37.3 mmHg.   3. Left atrial size was moderately dilated.   4. Right atrial size was moderately dilated.   5. The mitral valve is normal in structure. Trivial mitral valve  regurgitation.   6. The aortic valve is normal in structure. Aortic valve regurgitation is  not visualized.   Comparison(s): The left ventricular function has improved.    02/06/2020: TEE. LVEF 15-20% 01/23/2020: TTE,  LVEF <20%, grade III DD, RV  Mod reduced function  Laboratory Data:  High Sensitivity Troponin:  No results for input(s): TROPONINIHS in the last  720 hours.   Chemistry Recent Labs  Lab 03/12/20 1127 03/13/20 0357 03/14/20 0346  NA 136 140 137  K 4.4 4.7 4.6  CL 102 103 98  CO2 26 26 26   GLUCOSE 100* 92 95  BUN 17 15 22   CREATININE 1.06 1.06 1.12  CALCIUM 9.4 9.3 9.4  GFRNONAA >60 >60 >60  GFRAA >60 >60 >60  ANIONGAP 8 11 13     Recent Labs  Lab 03/12/20 1127  PROT 7.7  ALBUMIN 3.9  AST 33  ALT 28  ALKPHOS 87  BILITOT 0.7   Hematology Recent Labs  Lab 03/12/20 1127 03/13/20 0357  WBC 7.8 7.3  RBC 4.49 4.50  HGB 13.6 13.5  HCT 41.9 41.1  MCV 93.3 91.3  MCH 30.3 30.0  MCHC 32.5 32.8  RDW 14.4 14.5  PLT 214 193   BNP Recent Labs  Lab 03/12/20 1127  BNP 533.8*    DDimer No results for input(s): DDIMER in the last 168 hours.   Radiology/Studies:     Assessment and Plan:   1. Aflutter, atypical With multiple flutter circuits.  No description or mention thus far of AF He is on appropriately dosed eliquis for CHA2DS2Vasc is 3 Continue amiodarone 200 mg BID  2. Jehova's witness He has agreed to continued Eliquis.   3. Acute on chronic systolic CHF Most recent echo with some slight improvement, LVEF to 25-30% and RV mildly reduced LA described as mod enlarged (previously severely), measured 6mm  After discussion with HF team, tentatively plan AFL ablation tomorrow afternoon.   Dr. Curt Bears has seen the patient. Note to follow.   For questions or updates, please contact [place] Please consult [email or web address] for contact info under   Signed, Annamaria Helling  03/14/2020 11:45 AM  I have seen and examined this patient with Oda Kilts.  Agree with above, note added to reflect my findings.  On exam, tachycardic, irregular. Patient with continued atrial flutter. Zamiya Dillard plan for ablation tomorrow. If not isthmus dependent, Aloma Boch plan  for DCCV and continued amiodarone.    Nyesha Cliff M. Uri Covey MD 03/14/2020 2:07 PM

## 2020-03-14 NOTE — Plan of Care (Signed)
  Problem: Education: Goal: Ability to demonstrate management of disease process will improve Outcome: Progressing Goal: Ability to verbalize understanding of medication therapies will improve Outcome: Progressing Goal: Individualized Educational Video(s) Outcome: Progressing   Problem: Education: Goal: Ability to demonstrate management of disease process will improve Outcome: Progressing   Problem: Education: Goal: Ability to verbalize understanding of medication therapies will improve Outcome: Progressing   Problem: Education: Goal: Individualized Educational Video(s) Outcome: Progressing   Problem: Activity: Goal: Capacity to carry out activities will improve Outcome: Progressing   Problem: Activity: Goal: Capacity to carry out activities will improve Outcome: Progressing   Problem: Cardiac: Goal: Ability to achieve and maintain adequate cardiopulmonary perfusion will improve Outcome: Progressing

## 2020-03-14 NOTE — Progress Notes (Addendum)
Advanced Heart Failure Rounding Note  PCP-Cardiologist: No primary care provider on file.    Patient Profile   64 y/o male w/ CAD, severe systolic HF and PAFL. Recently underwent CABG. Post-op course c/b low output and PAFL requiring several DC-CV. Amiodarone recently reduced due to bradycardia.   Now readmitted w/ a/c CHF, recurrent ALF and suspected LE cellulitis.   Subjective:    Remains in AFL, 80s-90s.   Volume status improved. Continues to diurese well w/ IV Lasix. -4.2L in UOP yesterday.   Remains on abx for LE cellulitis. Markedly improved. AF. WBC normal.   No complaints today. Denies dyspnea. Unaware of AFL.    Objective:   Weight Range: 97.2 kg Body mass index is 29.06 kg/m.   Vital Signs:   Temp:  [97.7 F (36.5 C)-98.1 F (36.7 C)] 98 F (36.7 C) (06/17 0432) Pulse Rate:  [64-87] 72 (06/17 0432) Resp:  [15-20] 15 (06/17 0432) BP: (93-124)/(59-74) 100/69 (06/17 0432) SpO2:  [95 %-99 %] 99 % (06/17 0432) Weight:  [97.2 kg] 97.2 kg (06/17 0432) Last BM Date: 03/11/20  Weight change: Filed Weights   03/12/20 1340 03/13/20 0506 03/14/20 0432  Weight: 101.3 kg 98.3 kg 97.2 kg    Intake/Output:   Intake/Output Summary (Last 24 hours) at 03/14/2020 0850 Last data filed at 03/14/2020 0726 Gross per 24 hour  Intake 1320 ml  Output 4400 ml  Net -3080 ml      Physical Exam    General:  Well appearing WM sitting up in bed. No resp difficulty HEENT: Normal Neck: Supple. JVP ~7 cm . Carotids 2+ bilat; no bruits. No lymphadenopathy or thyromegaly appreciated. Cor: PMI nondisplaced. Irregular rhythm, regular rate. No rubs, gallops or murmurs. Lungs: Clear Abdomen: Soft, nontender, nondistended. No hepatosplenomegaly. No bruits or masses. Good bowel sounds. Extremities: No cyanosis, clubbing, rash, trace bilateral LEE. + TED hoses. Bilateral LE mildly erythematous (oerall much improved)  Neuro: Alert & orientedx3, cranial nerves grossly intact. moves  all 4 extremities w/o difficulty. Affect pleasant   Telemetry   Atrial flutter 80s-90s   EKG    No new EKG to review   Labs    CBC Recent Labs    03/12/20 1127 03/13/20 0357  WBC 7.8 7.3  HGB 13.6 13.5  HCT 41.9 41.1  MCV 93.3 91.3  PLT 214 846   Basic Metabolic Panel Recent Labs    03/13/20 0357 03/14/20 0346  NA 140 137  K 4.7 4.6  CL 103 98  CO2 26 26  GLUCOSE 92 95  BUN 15 22  CREATININE 1.06 1.12  CALCIUM 9.3 9.4  MG 1.8 2.1   Liver Function Tests Recent Labs    03/12/20 1127  AST 33  ALT 28  ALKPHOS 87  BILITOT 0.7  PROT 7.7  ALBUMIN 3.9   No results for input(s): LIPASE, AMYLASE in the last 72 hours. Cardiac Enzymes No results for input(s): CKTOTAL, CKMB, CKMBINDEX, TROPONINI in the last 72 hours.  BNP: BNP (last 3 results) Recent Labs    01/23/20 0338 03/12/20 1127  BNP 1,370.5* 533.8*    ProBNP (last 3 results) No results for input(s): PROBNP in the last 8760 hours.   D-Dimer No results for input(s): DDIMER in the last 72 hours. Hemoglobin A1C No results for input(s): HGBA1C in the last 72 hours. Fasting Lipid Panel No results for input(s): CHOL, HDL, LDLCALC, TRIG, CHOLHDL, LDLDIRECT in the last 72 hours. Thyroid Function Tests Recent Labs    03/12/20  1127  TSH 4.165    Other results:   Imaging    No results found.   Medications:     Scheduled Medications: . amiodarone  200 mg Oral BID  . apixaban  5 mg Oral BID  . aspirin  81 mg Oral Daily  . atorvastatin  40 mg Oral Daily  . digoxin  0.125 mg Oral QODAY  . furosemide  40 mg Intravenous BID  . losartan  12.5 mg Oral Daily  . sodium chloride flush  3 mL Intravenous Q12H  . spironolactone  25 mg Oral QHS    Infusions: . sodium chloride    . vancomycin 750 mg (03/14/20 0318)    PRN Medications: sodium chloride, acetaminophen, ondansetron (ZOFRAN) IV, sodium chloride flush     Assessment/Plan   1.Acute/Chronic Systolic Heart Failure - ECHO EF  15% with severe global HK, and moderate RV dysfunction. Etiology possible ETOH versus HTN (denies severe ETOH use). TSH was ok. HIV NR  - 4/28 LHC with severe 1v CAD. RHC with preserved cardiac output.  - S/p CABG x 1 w/ LIMA-LAD 5/4 -NYHAII. Admitted 6/15 w/ volume overload in the setting of recurrent AFL - Volume status much improved w/ IV Lasix. Net I/Os -6L.  - Continue IV Lasix 40 mg bid today. Transition to PO tomorrow.  -Continuespiro 25 mg daily  - Continue losartan 12.5. BP too soft for Entresto  - continue digoxin 0.125 mg. Dig level ok (0.3) -Concerned for tachy/brady syndrome. So will hold off on b blocker for now.  2. CAD:  - LHC 12/2019 with severe 1VCAD - S/p CABG x 1 w/ LIMA-LAD 01/30/20 - No chest pain. - on ASA 81 + statin, atorvastatin 40 mg  3.Bilateral lower extremity cellulitis - PCP 5/21startedKeflexx 7 days-->now with bilateral cellulitis.  - much improved w/ IV vanc. No signs of systemic infection. AF. WBC normal   - Continue abx therapy   4. Atrial Flutter  - had post operative Afib/Flutter post CABG s/p TEE/DCCV - Post op he went back in A flutter. Had been on dig + amio 400/400 but developed bradycardia so dig was stopped and amio was cut back to 200 mg daily. -He was set up for cardioversion on 5/28 but had chemically converted. -Had been on dig + amio 200/200 but developed bradycardia so dig was stopped and amio was cut back to 200 mg daily on 6/1.  - Now back in A flutter  - Amio increased back to 200 mg twice a day.  -  Back on Digoxin 0.125 mg - Continue eliquis 5 mg twice a day.  - Discussed w/ EP. Will try to arrange for AFL Ablation tomorrow  - Keep K > 4.0 and Mg > 2.0     Length of Stay: 2  Corey Brady, Corey Brady  03/14/2020, 8:50 AM  Advanced Heart Failure Team Pager [phone] (M-F; 7a - 4p)  Please contact [facility] for night-coverage after hours (4p -7a ) and weekends on [email or web address]   Patient seen and examined  with the above-signed Advanced Practice Provider and/or Housestaff. I personally reviewed laboratory data, imaging studies and relevant notes. I independently examined the patient and formulated the important aspects of the plan. I have edited the note to reflect any of my changes or salient points. I have personally discussed the plan with the patient and/or family.  He remains on IV lasix and vancomycin. TED hose on. Volume status looks much better but still overloaded. LE erythema also much improved.  Remains in AFL. No bleeding on Eliquis.   General:  Sitting up in bed. No resp difficulty HEENT: normal Neck: supple. JVP 8-9. Carotids 2+ bilat; no bruits. No lymphadenopathy or thryomegaly appreciated. Cor: PMI nondisplaced. irregular rate & rhythm. No rubs, gallops or murmurs. Lungs: clear Abdomen: soft, nontender, nondistended. No hepatosplenomegaly. No bruits or masses. Good bowel sounds. Extremities: no cyanosis, clubbing, rash, 1+edema mild erythema  Neuro: alert & orientedx3, cranial nerves grossly intact. moves all 4 extremities w/o difficulty. Affect pleasant  Continue IV diuresis. Can change vanc to doxy. Continue Eliquis. Plan AFL ablation tomorrow. D/w EP and PharmD.   Corey Meres, MD  6:37 PM

## 2020-03-15 ENCOUNTER — Inpatient Hospital Stay (HOSPITAL_COMMUNITY): Payer: Self-pay | Admitting: Anesthesiology

## 2020-03-15 ENCOUNTER — Encounter (HOSPITAL_COMMUNITY)
Admission: RE | Disposition: A | Discharge status: Home / Self Care | Payer: Self-pay | Source: Ambulatory Visit | Attending: Internal Medicine

## 2020-03-15 DIAGNOSIS — I4892 Unspecified atrial flutter: Secondary | ICD-10-CM

## 2020-03-15 HISTORY — PX: A-FLUTTER ABLATION: EP1230

## 2020-03-15 LAB — BASIC METABOLIC PANEL
Anion gap: 10 (ref 5–15)
BUN: 25 mg/dL — ABNORMAL HIGH (ref 8–23)
CO2: 28 mmol/L (ref 22–32)
Calcium: 9.2 mg/dL (ref 8.9–10.3)
Chloride: 99 mmol/L (ref 98–111)
Creatinine, Ser: 1.36 mg/dL — ABNORMAL HIGH (ref 0.61–1.24)
GFR calc Af Amer: >60 mL/min (ref >60)
GFR calc non Af Amer: 55 mL/min — ABNORMAL LOW (ref >60)
Glucose, Bld: 93 mg/dL (ref 70–99)
Potassium: 4.6 mmol/L (ref 3.5–5.1)
Sodium: 137 mmol/L (ref 135–145)

## 2020-03-15 SURGERY — A-FLUTTER ABLATION
Anesthesia: Monitor Anesthesia Care

## 2020-03-15 MED ORDER — HEPARIN (PORCINE) IN NACL 1000-0.9 UT/500ML-% IV SOLN
INTRAVENOUS | Status: AC
  Filled 2020-03-15: qty 500

## 2020-03-15 MED ORDER — ONDANSETRON HCL 4 MG/2ML IJ SOLN
INTRAMUSCULAR | Status: DC | PRN
  Administered 2020-03-15: 4 mg via INTRAVENOUS

## 2020-03-15 MED ORDER — DEXAMETHASONE SODIUM PHOSPHATE 10 MG/ML IJ SOLN
INTRAMUSCULAR | Status: DC | PRN
  Administered 2020-03-15: 5 mg via INTRAVENOUS

## 2020-03-15 MED ORDER — FENTANYL CITRATE (PF) 100 MCG/2ML IJ SOLN
INTRAMUSCULAR | Status: DC | PRN
  Administered 2020-03-15: 50 ug via INTRAVENOUS

## 2020-03-15 MED ORDER — SODIUM CHLORIDE 0.9% FLUSH
3.0000 mL | Freq: Two times a day (BID) | INTRAVENOUS | Status: DC
  Administered 2020-03-15 – 2020-03-18 (×6): 3 mL via INTRAVENOUS

## 2020-03-15 MED ORDER — LIDOCAINE 2% (20 MG/ML) 5 ML SYRINGE
INTRAMUSCULAR | Status: DC | PRN
  Administered 2020-03-15: 50 mg via INTRAVENOUS

## 2020-03-15 MED ORDER — PHENYLEPHRINE HCL-NACL 10-0.9 MG/250ML-% IV SOLN
INTRAVENOUS | Status: DC | PRN
  Administered 2020-03-15: 30 ug/min via INTRAVENOUS

## 2020-03-15 MED ORDER — POTASSIUM CHLORIDE CRYS ER 20 MEQ PO TBCR
40.0000 meq | EXTENDED_RELEASE_TABLET | Freq: Every day | ORAL | Status: DC
  Filled 2020-03-15: qty 2

## 2020-03-15 MED ORDER — SODIUM CHLORIDE 0.9% FLUSH: 3.0000 mL | INTRAVENOUS | Status: DC | PRN

## 2020-03-15 MED ORDER — BUPIVACAINE HCL (PF) 0.25 % IJ SOLN
INTRAMUSCULAR | Status: AC
  Filled 2020-03-15: qty 30

## 2020-03-15 MED ORDER — PROPOFOL 10 MG/ML IV BOLUS
INTRAVENOUS | Status: DC | PRN
  Administered 2020-03-15: 30 mg via INTRAVENOUS
  Administered 2020-03-15: 20 mg via INTRAVENOUS

## 2020-03-15 MED ORDER — PROPOFOL 500 MG/50ML IV EMUL
INTRAVENOUS | Status: DC | PRN
  Administered 2020-03-15: 75 ug/kg/min via INTRAVENOUS

## 2020-03-15 MED ORDER — SODIUM CHLORIDE 0.9 % IV SOLN: 250.0000 mL | INTRAVENOUS | Status: DC | PRN

## 2020-03-15 MED ORDER — BUPIVACAINE HCL (PF) 0.25 % IJ SOLN
INTRAMUSCULAR | Status: DC | PRN
  Administered 2020-03-15: 30 mL

## 2020-03-15 MED ORDER — HEPARIN (PORCINE) IN NACL 1000-0.9 UT/500ML-% IV SOLN
INTRAVENOUS | Status: DC | PRN
  Administered 2020-03-15: 500 mL

## 2020-03-15 SURGICAL SUPPLY — 12 items
BAG SNAP BAND KOVER 36X36 (MISCELLANEOUS) ×2 IMPLANT
CATH EZ STEER NAV 8MM F-J CUR (ABLATOR) ×4 IMPLANT
CATH WEBSTER BI DIR CS D-F CRV (CATHETERS) ×2 IMPLANT
DEVICE CLOSURE PERCLS PRGLD 6F (VASCULAR PRODUCTS) IMPLANT
PACK EP LATEX FREE (CUSTOM PROCEDURE TRAY) ×3
PACK EP LF (CUSTOM PROCEDURE TRAY) ×1 IMPLANT
PAD PRO RADIOLUCENT 2001M-C (PAD) ×3 IMPLANT
PATCH CARTO3 (PAD) ×2 IMPLANT
PERCLOSE PROGLIDE 6F (VASCULAR PRODUCTS) ×6
SHEATH PINNACLE 7F 10CM (SHEATH) ×4 IMPLANT
SHEATH PINNACLE 8F 10CM (SHEATH) ×2 IMPLANT
SHEATH PROBE COVER 6X72 (BAG) ×2 IMPLANT

## 2020-03-15 NOTE — Progress Notes (Signed)
Progress Note  Patient Name: Corey Brady Date of Encounter: 03/15/2020  [facility] Cardiologist: Bensimhon  Subjective   Overall feeling well  Inpatient Medications    Scheduled Meds: . amiodarone  200 mg Oral BID  . apixaban  5 mg Oral BID  . aspirin  81 mg Oral Daily  . atorvastatin  40 mg Oral Daily  . digoxin  0.125 mg Oral QODAY  . doxycycline  100 mg Oral Q12H  . hydrocerin   Topical BID  . losartan  12.5 mg Oral Daily  . [START ON 03/16/2020] potassium chloride  40 mEq Oral Daily  . sodium chloride flush  3 mL Intravenous Q12H  . spironolactone  25 mg Oral QHS   Continuous Infusions: . sodium chloride 250 mL (03/14/20 1538)   PRN Meds: sodium chloride, acetaminophen, ondansetron (ZOFRAN) IV, sodium chloride flush   Vital Signs    Vitals:   03/14/20 1956 03/14/20 2344 03/15/20 0307 03/15/20 0800  BP:   118/72 112/77  Pulse:   82 81  Resp:   20 18  Temp: 98 F (36.7 C) 98.1 F (36.7 C) 97.8 F (36.6 C) 97.8 F (36.6 C)  TempSrc: Oral Oral Oral Oral  SpO2: 97% 100% 97% 95%  Weight:   96.7 kg   Height:        Intake/Output Summary (Last 24 hours) at 03/15/2020 1050 Last data filed at 03/15/2020 0309 Gross per 24 hour  Intake 480 ml  Output 1075 ml  Net -595 ml   Last 3 Weights 03/15/2020 03/14/2020 03/13/2020  Weight (lbs) 213 lb 1.6 oz 214 lb 4.8 oz 216 lb 11.2 oz  Weight (kg) 96.662 kg 97.206 kg 98.294 kg      Telemetry    Atrial flutter- Personally Reviewed  ECG    Atrial flutter- Personally Reviewed  Physical Exam   GEN: No acute distress.   Neck: No JVD Cardiac:  Tachycardic, regular, no murmurs, rubs, or gallops.  Respiratory: Clear to auscultation bilaterally. GI: Soft, nontender, non-distended  MS: No edema; No deformity. Neuro:  Nonfocal  Psych: Normal affect   Labs    High Sensitivity Troponin:  No results for input(s): TROPONINIHS in the last 720 hours.    Chemistry Recent Labs  Lab 03/12/20 1127 03/12/20 1127  03/13/20 0357 03/14/20 0346 03/15/20 0426  NA 136   < > 140 137 137  K 4.4   < > 4.7 4.6 4.6  CL 102   < > 103 98 99  CO2 26   < > 26 26 28   GLUCOSE 100*   < > 92 95 93  BUN 17   < > 15 22 25*  CREATININE 1.06   < > 1.06 1.12 1.36*  CALCIUM 9.4   < > 9.3 9.4 9.2  PROT 7.7  --   --   --   --   ALBUMIN 3.9  --   --   --   --   AST 33  --   --   --   --   ALT 28  --   --   --   --   ALKPHOS 87  --   --   --   --   BILITOT 0.7  --   --   --   --   GFRNONAA >60   < > >60 >60 55*  GFRAA >60   < > >60 >60 >60  ANIONGAP 8   < > 11 13 10    < > =  values in this interval not displayed.     Hematology Recent Labs  Lab 03/12/20 1127 03/13/20 0357  WBC 7.8 7.3  RBC 4.49 4.50  HGB 13.6 13.5  HCT 41.9 41.1  MCV 93.3 91.3  MCH 30.3 30.0  MCHC 32.5 32.8  RDW 14.4 14.5  PLT 214 193    BNP Recent Labs  Lab 03/12/20 1127  BNP 533.8*     DDimer No results for input(s): DDIMER in the last 168 hours.   Radiology    No results found.  Cardiac Studies   03/12/2020: TTE IMPRESSIONS  1. Left ventricular ejection fraction, by estimation, is 25 to 30%. The  left ventricle has severely decreased function. The left ventricle  demonstrates regional wall motion abnormalities (see scoring  diagram/findings for description). The left  ventricular internal cavity size was moderately to severely dilated. Left  ventricular diastolic function could not be evaluated. Extensive  infarction in the LAD artery distribution and global hypokinesis.  2. Right ventricular systolic function is mildly reduced. The right  ventricular size is normal. There is mildly elevated pulmonary artery  systolic pressure. The estimated right ventricular systolic pressure is  63.7 mmHg.  3. Left atrial size was moderately dilated.  4. Right atrial size was moderately dilated.  5. The mitral valve is normal in structure. Trivial mitral valve  regurgitation.  6. The aortic valve is normal in structure.  Aortic valve regurgitation is  not visualized.   Comparison(s): The left ventricular function has improved.    02/06/2020: TEE. LVEF 15-20% 01/23/2020: TTE, LVEF <20%, grade III DD, RV  Mod reduced function  Patient Profile     64 y.o. male with a history coronary artery disease, cardiomyopathy possibly due to alcohol, hypertension, and atrial flutter who presents with heart failure and recurrent atrial flutter.  Assessment & Plan    1.  Atrial flutter: It appears that he has multiple circuits, though these could all be isthmus dependent.  He is in atrial flutter currently.  Due to that, we Koua Deeg plan for ablation.  He is currently on amiodarone, though he does continue to have issues with flutter.  He has been compliant with his anticoagulation.  Ablation plan for today.  Risks and benefits were discussed.  The patient understands these risks and is agreed to the procedure.  2.  Acute on chronic systolic heart failure: Ejection fraction 25 to 30%.  Plan per heart failure cardiology.  He has diuresed well.  For questions or updates, please contact [place] Please consult [email or web address] for contact info under        Signed, Elizaveta Mattice Meredith Leeds, MD  03/15/2020, 10:50 AM

## 2020-03-15 NOTE — Transfer of Care (Signed)
Immediate Anesthesia Transfer of Care Note  Patient: Corey Brady  Procedure(s) Performed: A-FLUTTER ABLATION (N/A )  Patient Location: PACU and Cath Lab  Anesthesia Type:MAC  Level of Consciousness: awake, alert  and oriented  Airway & Oxygen Therapy: Patient Spontanous Breathing and Patient connected to nasal cannula oxygen  Post-op Assessment: Report given to RN and Post -op Vital signs reviewed and stable  Post vital signs: Reviewed and stable  Last Vitals:  Vitals Value Taken Time  BP 124/67 03/15/20 1526  Temp 36.2 C 03/15/20 1524  Pulse 38 03/15/20 1529  Resp 12 03/15/20 1529  SpO2 100 % 03/15/20 1529  Vitals shown include unvalidated device data.  Last Pain:  Vitals:   03/15/20 1524  TempSrc: Temporal  PainSc: 0-No pain      Patients Stated Pain Goal: 0 (94/76/54 6503)  Complications: No complications documented.

## 2020-03-15 NOTE — Progress Notes (Signed)
Electrophysiology Rounding Note  Patient Name: Corey Brady Date of Encounter: 03/15/2020  Primary Cardiologist: No primary care provider on file. Electrophysiologist: Camnitz   Subjective   The patient is feeling OK this am. No new complaints. No questions about the procedure.   Inpatient Medications    Scheduled Meds: . amiodarone  200 mg Oral BID  . apixaban  5 mg Oral BID  . aspirin  81 mg Oral Daily  . atorvastatin  40 mg Oral Daily  . digoxin  0.125 mg Oral QODAY  . doxycycline  100 mg Oral Q12H  . hydrocerin   Topical BID  . losartan  12.5 mg Oral Daily  . [START ON 03/16/2020] potassium chloride  40 mEq Oral Daily  . sodium chloride flush  3 mL Intravenous Q12H  . spironolactone  25 mg Oral QHS   Continuous Infusions: . sodium chloride 250 mL (03/14/20 1538)   PRN Meds: sodium chloride, acetaminophen, ondansetron (ZOFRAN) IV, sodium chloride flush   Vital Signs    Vitals:   03/14/20 1956 03/14/20 2344 03/15/20 0307 03/15/20 0800  BP:   118/72 112/77  Pulse:   82 81  Resp:   20 18  Temp: 98 F (36.7 C) 98.1 F (36.7 C) 97.8 F (36.6 C) 97.8 F (36.6 C)  TempSrc: Oral Oral Oral Oral  SpO2: 97% 100% 97% 95%  Weight:   96.7 kg   Height:        Intake/Output Summary (Last 24 hours) at 03/15/2020 1054 Last data filed at 03/15/2020 0309 Gross per 24 hour  Intake 480 ml  Output 1075 ml  Net -595 ml   Filed Weights   03/13/20 0506 03/14/20 0432 03/15/20 0307  Weight: 98.3 kg 97.2 kg 96.7 kg    Physical Exam    GEN- The patient is well appearing, alert and oriented x 3 today.   Head- normocephalic, atraumatic Eyes-  Sclera clear, conjunctiva pink Ears- hearing intact Oropharynx- clear Neck- supple Lungs- Clear to ausculation bilaterally, normal work of breathing Heart- Regular rhythm, slightly tachy, no murmurs, rubs or gallops GI- soft, NT, ND, + BS Extremities- no clubbing, cyanosis, or edema Skin- no rash or lesion Psych- euthymic mood, full  affect Neuro- strength and sensation are intact  Labs    CBC Recent Labs    03/12/20 1127 03/13/20 0357  WBC 7.8 7.3  HGB 13.6 13.5  HCT 41.9 41.1  MCV 93.3 91.3  PLT 214 193   Basic Metabolic Panel Recent Labs    41/93/79 0357 03/13/20 0357 03/14/20 0346 03/15/20 0426  NA 140   < > 137 137  K 4.7   < > 4.6 4.6  CL 103   < > 98 99  CO2 26   < > 26 28  GLUCOSE 92   < > 95 93  BUN 15   < > 22 25*  CREATININE 1.06   < > 1.12 1.36*  CALCIUM 9.3   < > 9.4 9.2  MG 1.8  --  2.1  --    < > = values in this interval not displayed.   Liver Function Tests Recent Labs    03/12/20 1127  AST 33  ALT 28  ALKPHOS 87  BILITOT 0.7  PROT 7.7  ALBUMIN 3.9   No results for input(s): LIPASE, AMYLASE in the last 72 hours. Cardiac Enzymes No results for input(s): CKTOTAL, CKMB, CKMBINDEX, TROPONINI in the last 72 hours.   Telemetry    Atrial flutter 90-100s (  personally reviewed)  Radiology    No results found.  Patient Profile     Corey Brady is a 64 y.o. male with a hx of CAD, CM (possible ETOH), HTN who is being seen today for the evaluation of AFlutter, rhythm management at the request of Dr. Haroldine Laws.  Assessment & Plan    1. Aflutter, atypical With multiple flutter circuits.  Plan for EPS today. If isthmus dependent will plan ablation; if not able to ablate, will plan DCCV and continued amiodarone.  No description or mention thus far of AF He is on appropriately dosed eliquis for CHA2DS2Vasc is 3 Continue amiodarone 200 mg BID  2. Jehova's witness He has agreed to continued Eliquis. No change  3. Acute on chronic systolic CHF Most recent echo with some slight improvement, LVEF to 25-30% and RV mildly reduced LA described as mod enlarged (previously severely), measured 87mm CHF team following.   For questions or updates, please contact [place] Please consult [email or web address] for contact info under Cardiology/STEMI.  Signed, Shirley Friar,  PA-C  03/15/2020, 10:54 AM

## 2020-03-15 NOTE — H&P (Signed)
Corey Brady has presented today for surgery, with the diagnosis of atrial flutter.  The various methods of treatment have been discussed with the patient and family. After consideration of risks, benefits and other options for treatment, the patient has consented to  Procedure(s): Catheter ablation as a surgical intervention .  Risks include but not limited to bleeding, tamponade, heart block, stroke, damage to surrounding organs, among others. The patient's history has been reviewed, patient examined, no change in status, stable for surgery.  I have reviewed the patient's chart and labs.  Questions were answered to the patient's satisfaction.    Constantin Hillery Elberta Fortis, MD 03/15/2020 10:21 AM

## 2020-03-15 NOTE — Anesthesia Procedure Notes (Signed)
Procedure Name: MAC Date/Time: 03/15/2020 1:29 PM Performed by: Trinna Post., CRNA Pre-anesthesia Checklist: Patient identified, Emergency Drugs available, Suction available, Patient being monitored and Timeout performed Patient Re-evaluated:Patient Re-evaluated prior to induction Oxygen Delivery Method: Circle system utilized Preoxygenation: Pre-oxygenation with 100% oxygen Induction Type: IV induction Placement Confirmation: positive ETCO2

## 2020-03-15 NOTE — Progress Notes (Addendum)
Advanced Heart Failure Rounding Note  PCP-Cardiologist: No primary care provider on file.    Patient Profile   64 y/o male w/ CAD, severe systolic HF and PAFL. Recently underwent CABG. Post-op course c/b low output and PAFL requiring several DC-CV. Amiodarone recently reduced due to bradycardia.   Now readmitted w/ a/c CHF, recurrent ALF and suspected LE cellulitis.   Subjective:    Feels good. Denies SOB. Walking.   Objective:   Weight Range: 96.7 kg Body mass index is 28.9 kg/m.   Vital Signs:   Temp:  [97.8 F (36.6 C)-98.1 F (36.7 C)] 97.8 F (36.6 C) (06/18 0307) Pulse Rate:  [81-95] 82 (06/18 0307) Resp:  [15-20] 20 (06/18 0307) BP: (97-118)/(59-74) 118/72 (06/18 0307) SpO2:  [97 %-100 %] 97 % (06/18 0307) Weight:  [96.7 kg] 96.7 kg (06/18 0307) Last BM Date: 03/14/20  Weight change: Filed Weights   03/13/20 0506 03/14/20 0432 03/15/20 0307  Weight: 98.3 kg 97.2 kg 96.7 kg    Intake/Output:   Intake/Output Summary (Last 24 hours) at 03/15/2020 0949 Last data filed at 03/15/2020 0309 Gross per 24 hour  Intake 480 ml  Output 1075 ml  Net -595 ml      Physical Exam    General:  Standing in the room.  No resp difficulty HEENT: normal Neck: supple. no JVD. Carotids 2+ bilat; no bruits. No lymphadenopathy or thryomegaly appreciated. Cor: PMI nondisplaced. Irregular rate & rhythm. No rubs, gallops or murmurs. Lungs: clear Abdomen: soft, nontender, nondistended. No hepatosplenomegaly. No bruits or masses. Good bowel sounds. Extremities: no cyanosis, clubbing, rash, R and LLE ted hose.  Neuro: alert & orientedx3, cranial nerves grossly intact. moves all 4 extremities w/o difficulty. Affect pleasant   Telemetry    A flutter 90-100s   EKG    No new EKG to review   Labs    CBC Recent Labs    03/12/20 1127 03/13/20 0357  WBC 7.8 7.3  HGB 13.6 13.5  HCT 41.9 41.1  MCV 93.3 91.3  PLT 214 193   Basic Metabolic Panel Recent Labs     03/13/20 0357 03/13/20 0357 03/14/20 0346 03/15/20 0426  NA 140   < > 137 137  K 4.7   < > 4.6 4.6  CL 103   < > 98 99  CO2 26   < > 26 28  GLUCOSE 92   < > 95 93  BUN 15   < > 22 25*  CREATININE 1.06   < > 1.12 1.36*  CALCIUM 9.3   < > 9.4 9.2  MG 1.8  --  2.1  --    < > = values in this interval not displayed.   Liver Function Tests Recent Labs    03/12/20 1127  AST 33  ALT 28  ALKPHOS 87  BILITOT 0.7  PROT 7.7  ALBUMIN 3.9   No results for input(s): LIPASE, AMYLASE in the last 72 hours. Cardiac Enzymes No results for input(s): CKTOTAL, CKMB, CKMBINDEX, TROPONINI in the last 72 hours.  BNP: BNP (last 3 results) Recent Labs    01/23/20 0338 03/12/20 1127  BNP 1,370.5* 533.8*    ProBNP (last 3 results) No results for input(s): PROBNP in the last 8760 hours.   D-Dimer No results for input(s): DDIMER in the last 72 hours. Hemoglobin A1C No results for input(s): HGBA1C in the last 72 hours. Fasting Lipid Panel No results for input(s): CHOL, HDL, LDLCALC, TRIG, CHOLHDL, LDLDIRECT in the last  72 hours. Thyroid Function Tests Recent Labs    03/12/20 1127  TSH 4.165    Other results:   Imaging    No results found.   Medications:     Scheduled Medications: . amiodarone  200 mg Oral BID  . apixaban  5 mg Oral BID  . aspirin  81 mg Oral Daily  . atorvastatin  40 mg Oral Daily  . digoxin  0.125 mg Oral QODAY  . doxycycline  100 mg Oral Q12H  . furosemide  40 mg Intravenous BID  . hydrocerin   Topical BID  . losartan  12.5 mg Oral Daily  . sodium chloride flush  3 mL Intravenous Q12H  . spironolactone  25 mg Oral QHS    Infusions: . sodium chloride 250 mL (03/14/20 1538)    PRN Medications: sodium chloride, acetaminophen, ondansetron (ZOFRAN) IV, sodium chloride flush     Assessment/Plan   1.Acute/Chronic Systolic Heart Failure - ECHO EF 15% with severe global HK, and moderate RV dysfunction. Etiology possible ETOH versus HTN  (denies severe ETOH use). TSH was ok. HIV NR  - 4/28 LHC with severe 1v CAD. RHC with preserved cardiac output.  - S/p CABG x 1 w/ LIMA-LAD 5/4 -NYHAII. Admitted 6/15 w/ volume overload in the setting of recurrent AFL -Overall diuresed 10 pounds.  Stop IV lasix now. Renal function trending up a little.  Start lasix 40 mg po daily tomorrow. (on lasix 20 mg daily on admit )  -Continuespiro 25 mg daily  - Continue losartan 12.5. BP too soft for Entresto  - continue digoxin 0.125 mg. Dig level ok (0.3) -Concerned for tachy/brady syndrome. So will hold off on b blocker for now.  2. CAD:  - LHC 12/2019 with severe 1VCAD - S/p CABG x 1 w/ LIMA-LAD 01/30/20 - No chest pain.  - on ASA 81 + statin, atorvastatin 40 mg  3.Bilateral lower extremity cellulitis - PCP 5/21startedKeflexx 7 days-->now with bilateral cellulitis.  - much improved w/ IV vanc and has transitioned to doxycyline.  - Plan to completed 7 days.   4. Atrial Flutter  - had post operative Afib/Flutter post CABG s/p TEE/DCCV - Post op he went back in A flutter. Had been on dig + amio 400/400 but developed bradycardia so dig was stopped and amio was cut back to 200 mg daily. -He was set up for cardioversion on 5/28 but had chemically converted. -Had been on dig + amio 200/200 but developed bradycardia so dig was stopped and amio was cut back to 200 mg daily on 6/1.  - Remains in A flutter. Amio was increased on admit.  - Continue amio 200 mg twice a day. EP will taper.  -  Back on Digoxin 0.125 mg - Continue eliquis 5 mg twice a day.  - Discussed w/ EP they will arrange follow up.       Length of Stay: 3  Amy Clegg, NP  03/15/2020, 9:49 AM  Advanced Heart Failure Team Pager [phone] (M-F; 7a - 4p)  Please contact [facility] for night-coverage after hours (4p -7a ) and weekends on [email or web address]   Patient seen and examined with the above-signed Advanced Practice Provider and/or Housestaff. I personally  reviewed laboratory data, imaging studies and relevant notes. I independently examined the patient and formulated the important aspects of the plan. I have edited the note to reflect any of my changes or salient points. I have personally discussed the plan with the patient and/or family.  Continues to  diurese well. Underwent AFL ablation today which was successful but post-procedure has juntional rhythm at 40. BP stable. Amio stopped. Cellulitis improved.   General:  Sitting up in bed. No resp difficulty HEENT: normal Neck: supple. JVP 8. Carotids 2+ bilat; no bruits. No lymphadenopathy or thryomegaly appreciated. Cor: PMI nondisplaced. Regular brady  No rubs, gallops or murmurs. Lungs: clear Abdomen: soft, nontender, nondistended. No hepatosplenomegaly. No bruits or masses. Good bowel sounds. Extremities: no cyanosis, clubbing, rash, 1+ edema erythema improved Neuro: alert & orientedx3, cranial nerves grossly intact. moves all 4 extremities w/o difficulty. Affect pleasant  Continue IV diuresis. Watch rhythm closely. May need ICD/PPM. Continue doxy for LE cellulitis (much improved). Continue Eliquis.   Glori Bickers, MD  5:27 PM

## 2020-03-15 NOTE — Anesthesia Preprocedure Evaluation (Addendum)
Anesthesia Evaluation  Patient identified by MRN, date of birth, ID band Patient awake    Reviewed: Allergy & Precautions, NPO status , Patient's Chart, lab work & pertinent test results  History of Anesthesia Complications Negative for: history of anesthetic complications  Airway Mallampati: II  TM Distance: >3 FB Neck ROM: Full    Dental  (+) Dental Advisory Given, Loose,    Pulmonary neg pulmonary ROS,    Pulmonary exam normal        Cardiovascular + CAD, + CABG (01/2020) and +CHF  Normal cardiovascular exam+ dysrhythmias Atrial Fibrillation    '21 TTE - EF 25 to 30%. The left ventricle demonstrates regional wall motion abnormalities. The left ventricular internal cavity size was moderately to severely dilated. Extensive infarction in the LAD artery distribution and global hypokinesis. Right ventricular systolic function is mildly reduced. Mildly elevated pulmonary artery systolic pressure. The estimated right ventricular systolic pressure is 78.9 mmHg. Left atrial size was moderately dilated. Right atrial size was moderately dilated. Trivial mitral valve regurgitation.     Neuro/Psych negative neurological ROS  negative psych ROS   GI/Hepatic negative GI ROS, Neg liver ROS,   Endo/Other  negative endocrine ROS  Renal/GU Renal InsufficiencyRenal disease     Musculoskeletal negative musculoskeletal ROS (+)   Abdominal   Peds  Hematology  On eliquis    Anesthesia Other Findings Covid test negative   Reproductive/Obstetrics                            Anesthesia Physical Anesthesia Plan  ASA: IV  Anesthesia Plan: MAC   Post-op Pain Management:    Induction: Intravenous  PONV Risk Score and Plan: 1 and Propofol infusion and Treatment may vary due to age or medical condition  Airway Management Planned: Natural Airway and Simple Face Mask  Additional Equipment: None  Intra-op Plan:    Post-operative Plan:   Informed Consent: I have reviewed the patients History and Physical, chart, labs and discussed the procedure including the risks, benefits and alternatives for the proposed anesthesia with the patient or authorized representative who has indicated his/her understanding and acceptance.       Plan Discussed with: CRNA, Anesthesiologist and Surgeon  Anesthesia Plan Comments:       Anesthesia Quick Evaluation

## 2020-03-16 LAB — CBC
HCT: 45.7 % (ref 39.0–52.0)
Hemoglobin: 14.9 g/dL (ref 13.0–17.0)
MCH: 29.2 pg (ref 26.0–34.0)
MCHC: 32.6 g/dL (ref 30.0–36.0)
MCV: 89.6 fL (ref 80.0–100.0)
Platelets: 254 10*3/uL (ref 150–400)
RBC: 5.1 MIL/uL (ref 4.22–5.81)
RDW: 13.8 % (ref 11.5–15.5)
WBC: 7.7 10*3/uL (ref 4.0–10.5)
nRBC: 0 % (ref 0.0–0.2)

## 2020-03-16 LAB — BASIC METABOLIC PANEL
Anion gap: 12 (ref 5–15)
BUN: 29 mg/dL — ABNORMAL HIGH (ref 8–23)
CO2: 24 mmol/L (ref 22–32)
Calcium: 9.7 mg/dL (ref 8.9–10.3)
Chloride: 99 mmol/L (ref 98–111)
Creatinine, Ser: 1.12 mg/dL (ref 0.61–1.24)
GFR calc Af Amer: >60 mL/min (ref >60)
GFR calc non Af Amer: >60 mL/min (ref >60)
Glucose, Bld: 129 mg/dL — ABNORMAL HIGH (ref 70–99)
Potassium: 5.3 mmol/L — ABNORMAL HIGH (ref 3.5–5.1)
Sodium: 135 mmol/L (ref 135–145)

## 2020-03-16 NOTE — Evaluation (Signed)
Physical Therapy Evaluation Patient Details Name: Corey Brady MRN: [id number] DOB: 1956/08/01 Today's Date: 03/16/2020   History of Present Illness  64 y/o male admitted on 03/12/20 for aflutter, CHF, recurrent ALF and suspected LE cellulitis. S/P  ablation 6/18. PMH CAD, severe systolic HF, PAFL, CABG 06/05/32.  Clinical Impression  Pt presents with no decrease in functional mobility secondary to above. PTA, pt retired living in one story home with brother. Reeducated on sternal precautions and symptoms to be wary of prior to amb. Today, pt able to complete all mobility independently. At beginning of session pt HR monitor read afib and aflutter between 40 bpm and 56 bpm. At the start of amb pt monitor read afib 56 bpm, halfway through amb pt HR elevated to 124 bpm briefly, with monitor reading Vtach but waveform not indicating vtach (16:15-16:20). Pt HR decreased after stopping amb in hall staying between 50-80 bpm monitor continuing to read vtach with waveform not indicative of vtach. Pt remained asymptomatic during entire amb bout and after session. Pt independent from functional mobility stand point and can d/c from PT acutely.   BP pre session 116/71 HR pre session between 40-56 bpm HR during amb up to 124 bpm HR at end of session 56 bpm     Follow Up Recommendations No PT follow up    Equipment Recommendations  None recommended by PT    Recommendations for Other Services       Precautions / Restrictions Precautions Precautions: None      Mobility  Bed Mobility Overal bed mobility: Independent                Transfers Overall transfer level: Independent                  Ambulation/Gait Ambulation/Gait assistance: Independent Gait Distance (Feet): 250 Feet Assistive device: None Gait Pattern/deviations: WFL(Within Functional Limits)     General Gait Details: Pt required no assistance for amb, pt reported asymptomatic during amb, at start of amb heart monitor  read afib 56 BPM, during amb HR progressively increased jumping to 124 BPM and reading Vtach, EKG reading did not resemble Vtach. Pt continued to be asymptomatic when asked.  Stairs            Wheelchair Mobility    Modified Rankin (Stroke Patients Only)       Balance Overall balance assessment: No apparent balance deficits (not formally assessed)                                           Pertinent Vitals/Pain Pain Assessment: No/denies pain    Home Living Family/patient expects to be discharged to:: Private residence Living Arrangements: Other relatives (brother) Available Help at Discharge: Family;Available PRN/intermittently Type of Home: House Home Access: Stairs to enter Entrance Stairs-Rails: Psychiatric nurse of Steps: 6 Home Layout: One level Home Equipment: Walker - 2 wheels;Bedside commode      Prior Function Level of Independence: Independent               Hand Dominance        Extremity/Trunk Assessment   Upper Extremity Assessment Upper Extremity Assessment: Overall WFL for tasks assessed    Lower Extremity Assessment Lower Extremity Assessment: Overall WFL for tasks assessed       Communication   Communication: No difficulties  Cognition Arousal/Alertness: Awake/alert Behavior During Therapy: WFL for  tasks assessed/performed Overall Cognitive Status: Within Functional Limits for tasks assessed                                        General Comments      Exercises Other Exercises Other Exercises: mimicked stair amb with 6 high knee steps no UE support. pt able to complete with no LOB.   Assessment/Plan    PT Assessment Patent does not need any further PT services  PT Problem List         PT Treatment Interventions      PT Goals (Current goals can be found in the Care Plan section)  Acute Rehab PT Goals PT Goal Formulation: All assessment and education complete, DC  therapy    Frequency     Barriers to discharge        Co-evaluation               AM-PAC PT "6 Clicks" Mobility  Outcome Measure Help needed turning from your back to your side while in a flat bed without using bedrails?: None Help needed moving from lying on your back to sitting on the side of a flat bed without using bedrails?: None Help needed moving to and from a bed to a chair (including a wheelchair)?: None Help needed standing up from a chair using your arms (e.g., wheelchair or bedside chair)?: None Help needed to walk in hospital room?: None Help needed climbing 3-5 steps with a railing? : None 6 Click Score: 24    End of Session Equipment Utilized During Treatment: Gait belt Activity Tolerance: Patient tolerated treatment well Patient left: in chair;with call bell/phone within reach Nurse Communication: Mobility status;Other (comment) (vital signs)      Time: 6712-4580 PT Time Calculation (min) (ACUTE ONLY): 15 min   Charges:   PT Evaluation $PT Eval Moderate Complexity: 1 Mod          Keauna Brasel SPT 03/16/2020   Sanjuana Letters 03/16/2020, 5:02 PM

## 2020-03-16 NOTE — Progress Notes (Signed)
Progress Note  Patient Name: Corey Brady Date of Encounter: 03/16/2020  [facility] Cardiologist: Bensimhon  Subjective   Feeling well without complaint. S/p typical flutter ablation yesterday.  Inpatient Medications    Scheduled Meds: . apixaban  5 mg Oral BID  . aspirin  81 mg Oral Daily  . atorvastatin  40 mg Oral Daily  . doxycycline  100 mg Oral Q12H  . hydrocerin   Topical BID  . losartan  12.5 mg Oral Daily  . potassium chloride  40 mEq Oral Daily  . sodium chloride flush  3 mL Intravenous Q12H  . sodium chloride flush  3 mL Intravenous Q12H  . spironolactone  25 mg Oral QHS   Continuous Infusions: . sodium chloride 10 mL/hr at 03/15/20 1325  . sodium chloride     PRN Meds: sodium chloride, sodium chloride, acetaminophen, ondansetron (ZOFRAN) IV, sodium chloride flush, sodium chloride flush   Vital Signs    Vitals:   03/15/20 1946 03/15/20 1958 03/16/20 0000 03/16/20 0349  BP:  123/73 123/69 127/68  Pulse: 75 75 (!) 51 (!) 50  Resp: 18 18 20    Temp:  97.6 F (36.4 C) 98.2 F (36.8 C) (!) 97.4 F (36.3 C)  TempSrc: Oral Oral Oral Oral  SpO2:  95% 96% 95%  Weight:    96 kg  Height:        Intake/Output Summary (Last 24 hours) at 03/16/2020 0807 Last data filed at 03/16/2020 0630 Gross per 24 hour  Intake 300 ml  Output 425 ml  Net -125 ml   Last 3 Weights 03/16/2020 03/15/2020 03/14/2020  Weight (lbs) 211 lb 11.2 oz 213 lb 1.6 oz 214 lb 4.8 oz  Weight (kg) 96.026 kg 96.662 kg 97.206 kg      Telemetry    Sinus rhythm- Personally Reviewed  ECG    Junctional rhythm- Personally Reviewed  Physical Exam   GEN: Well nourished, well developed, in no acute distress  HEENT: normal  Neck: no JVD, carotid bruits, or masses Cardiac: RRR; no murmurs, rubs, or gallops,1-2+ edema  Respiratory:  clear to auscultation bilaterally, normal work of breathing GI: soft, nontender, nondistended, + BS MS: no deformity or atrophy  Skin: warm and dry Neuro:   Strength and sensation are intact Psych: euthymic mood, full affect   Labs    High Sensitivity Troponin:  No results for input(s): TROPONINIHS in the last 720 hours.    Chemistry Recent Labs  Lab 03/12/20 1127 03/13/20 0357 03/14/20 0346 03/15/20 0426 03/16/20 0358  NA 136   < > 137 137 135  K 4.4   < > 4.6 4.6 5.3*  CL 102   < > 98 99 99  CO2 26   < > 26 28 24   GLUCOSE 100*   < > 95 93 129*  BUN 17   < > 22 25* 29*  CREATININE 1.06   < > 1.12 1.36* 1.12  CALCIUM 9.4   < > 9.4 9.2 9.7  PROT 7.7  --   --   --   --   ALBUMIN 3.9  --   --   --   --   AST 33  --   --   --   --   ALT 28  --   --   --   --   ALKPHOS 87  --   --   --   --   BILITOT 0.7  --   --   --   --  GFRNONAA >60   < > >60 55* >60  GFRAA >60   < > >60 >60 >60  ANIONGAP 8   < > 13 10 12    < > = values in this interval not displayed.     Hematology Recent Labs  Lab 03/12/20 1127 03/13/20 0357 03/16/20 0358  WBC 7.8 7.3 7.7  RBC 4.49 4.50 5.10  HGB 13.6 13.5 14.9  HCT 41.9 41.1 45.7  MCV 93.3 91.3 89.6  MCH 30.3 30.0 29.2  MCHC 32.5 32.8 32.6  RDW 14.4 14.5 13.8  PLT 214 193 254    BNP Recent Labs  Lab 03/12/20 1127  BNP 533.8*     DDimer No results for input(s): DDIMER in the last 168 hours.   Radiology    EP STUDY  Result Date: 03/15/2020  SURGEON:  03/17/2020, MD    PREPROCEDURE DIAGNOSIS:  Atrial flutter.    POSTPROCEDURE DIAGNOSIS:  Isthmus-dependent counter clockwise right atrial flutter.    PROCEDURES:  1. Comprehensive EP study.  2. Coronary sinus pacing and recording.  3. Mapping of supraventricular tachycardia.  4. Radiofrequency ablation of supraventricular tachycardia.    INTRODUCTION: Corey Brady is a 64 y.o. male with a history of typical appearing atrial flutter who presents today for EP study and radiofrequency ablation.  The patient recently developed symptoms of weakness and fatigue for which he  was found to have atrial flutter with elevated ventricular rates.  The  patient remains symptomatic despite rate control.  The patient has been adequately anticoagulated for three weeks and now presents for EP study and radiofrequency ablation of atrial  flutter.    DESCRIPTION OF PROCEDURE:  Informed written consent was obtained and the  patient was brought to the Electrophysiology Lab in the fasting state. The patient was very clear that she had been compliant with eliquis over the past 3 weeks without interruption.  The patient was adequately sedated with intravenous medication as outlined in the anesthesia report.  The patient's right groin was prepped and draped in the usual sterile fashion by the EP Lab staff.  Using a percutaneous Seldinger technique, two 6-French and one 8-French hemostasis sheaths were placed  into the right common femoral vein.  A 6-French decapolar Polaris X catheter was introduced through the right common femoral vein and advanced into the coronary sinus for recording and pacing from this location.  A 6-French quadripolar Josephson catheter was introduced through the right common femoral vein and advanced into the right  ventricle for recording and pacing.  This catheter was then pulled back to the His bundle location.  Direct ultrasound guidance is used for right femoral vein with normal vessel patency. Ultrasound images are captured and stored in the patient's chart. Using ultrasound guidance, the Brockenbrough needle and wire were visualized entering the vessel. Presenting Measurements: The patient presented to the Electrophysiology Lab in atrial flutter.  The surface electrocardiogram was consistent with typical atrial flutter.  The atrial flutter cycle length was 300 milliseconds.  The coronary sinus catheter activation revealed proximal to distal activation and was therefore suggestive of right atrial flutter.  The patient's QRS duration was 116 milliseconds with a QT interval of 474 milliseconds and an HV interval of 40 milliseconds. Entrainment and  Mapping: Entrainment was performed from the left atrium, which revealed a long postpacing interval.  A 64 II XB 8-mm ablation catheter was introduced through the right common femoral vein and advanced into the right atrium.  The catheter was positioned  along the cavotricuspid isthmus.  Entrainment mapping was performed from the cavotricuspid isthmus.  The postpacing interval was equal to the tachycardia cycle length when pacing in this location during entrainment.  The patient was therefore felt to have isthmus-dependent right atrial flutter.  Mapping was performed along the atrial side of the cavotricuspid isthmus.  This demonstrated a moderate-sized isthmus.  I therefore elected to perform cavotricuspid isthmus ablation today. Ablation: The ablation catheter was therefore positioned along the cavotricuspid isthmus and a series of radiofrequency applications were delivered with a target temperature of 60 degrees of 50 watts for 120 seconds each.  The tachycardia slowed and then terminated during radiofrequency  application.  Additional mapping of the atrial signal was performed with  additional ablation performed.  A 7-French Biosense Webster dual decapolar halo catheter was introduced through the right common femoral vein and advanced into the right atrium.  This catheter was positioned around the tricuspid valve annulus.  This demonstrated that the patient continued to have conduction through the cavotricuspid isthmus.  An 8-  [place] RAMP sheath was therefore advanced through the right common  femoral vein into the right atrium.  The ablation catheter was positioned through the RAMP sheath along the cavotricuspid isthmus and an additional radiofrequency applications were delivered with a target temperature of 60 degrees at 50 watts.  Following bonus radiofrequency applications, complete bidirectional isthmus block was  achieved as evident by differential atrial pacing from the low lateral  right atrium.  The patient was observed for 20 minutes without return of conduction through the cavotricuspid isthmus.  Measurements following ablation: Following ablation, the stimulus to earliest atrial activation recorded across the isthmus line measured 200 milliseconds.  The AH interval measured 80 milliseconds with an HV interval of 46 milliseconds.  Atrial pacing was performed, which revealed decremental AV conduction with no evidence of PR greater than RR.  The AV Wenckebach cycle length was 460 milliseconds.  Ventricular pacing was performed, which revealed midline decremental VA conduction with a retrograde VA block of 420 milliseconds.  No arrhythmias were induced. The procedure was therefore considered completed.  All catheters were removed and the sheaths were aspirated and flushed.  The sheaths were removed and hemostasis was assured. EBL<34ml.  There were no early apparent complications.    CONCLUSIONS:  1. Isthmus-dependent right atrial flutter upon presentation.  2. Successful radiofrequency ablation of atrial flutter along the cavotricuspid isthmus with complete bidirectional isthmus block achieved.  3. No inducible arrhythmias following ablation.  4. No early apparent complications.    Cardiac Studies   03/12/2020: TTE IMPRESSIONS  1. Left ventricular ejection fraction, by estimation, is 25 to 30%. The  left ventricle has severely decreased function. The left ventricle  demonstrates regional wall motion abnormalities (see scoring  diagram/findings for description). The left  ventricular internal cavity size was moderately to severely dilated. Left  ventricular diastolic function could not be evaluated. Extensive  infarction in the LAD artery distribution and global hypokinesis.  2. Right ventricular systolic function is mildly reduced. The right  ventricular size is normal. There is mildly elevated pulmonary artery  systolic pressure. The estimated right ventricular systolic  pressure is  41.2 mmHg.  3. Left atrial size was moderately dilated.  4. Right atrial size was moderately dilated.  5. The mitral valve is normal in structure. Trivial mitral valve  regurgitation.  6. The aortic valve is normal in structure. Aortic valve regurgitation is  not visualized.   Comparison(s): The left ventricular function has improved.  02/06/2020: TEE. LVEF 15-20% 01/23/2020: TTE, LVEF <20%, grade III DD, RV  Mod reduced function  Patient Profile     64 y.o. male with a history coronary artery disease, cardiomyopathy possibly due to alcohol, hypertension, and atrial flutter who presents with heart failure and recurrent atrial flutter.  Assessment & Plan    1.  Atrial flutter: s/p ablation yesterday. Had persistent junctional rhythm post ablation but is now in sinus. would avoid all rate controlling medications. If he needs an ICD would place an atrial lead at that time. Has no AV nodal disease as found on EP study.  Activity restrictions discussed. Corey Brady arrange follow up in EP clinic.  2.  Acute on chronic systolic heart failure: EF 25-30%. Plan per HF team.  For questions or updates, please contact [facility] Please consult [email or web address] for contact info under        Signed, Lorella Gomez Jorja Loa, MD  03/16/2020, 8:07 AM

## 2020-03-16 NOTE — Progress Notes (Addendum)
Advanced Heart Failure Rounding Note  PCP-Cardiologist: No primary care provider on file.    Patient Profile   64 y/o male w/ CAD, severe systolic HF and PAFL. Recently underwent CABG. Post-op course c/b low output and PAFL requiring several DC-CV. Amiodarone recently reduced due to bradycardia.   Now readmitted w/ a/c CHF, recurrent ALF and suspected LE cellulitis.   Subjective:    Underwent AFL ablation yesterday. Now in NSR 60s with periods of junctional rhythm in 40s. Asymptomatic. Breathing better. No bleeding on eliquis   Objective:   Weight Range: 96 kg Body mass index is 28.71 kg/m.   Vital Signs:   Temp:  [97.2 F (36.2 C)-98.3 F (36.8 C)] 98.3 F (36.8 C) (06/19 0812) Pulse Rate:  [38-78] 73 (06/19 0812) Resp:  [12-21] 20 (06/19 0812) BP: (106-134)/(50-81) 124/73 (06/19 0812) SpO2:  [94 %-100 %] 94 % (06/19 0812) Weight:  [96 kg] 96 kg (06/19 0349) Last BM Date: 03/11/20  Weight change: Filed Weights   03/14/20 0432 03/15/20 0307 03/16/20 0349  Weight: 97.2 kg 96.7 kg 96 kg    Intake/Output:   Intake/Output Summary (Last 24 hours) at 03/16/2020 1342 Last data filed at 03/16/2020 0630 Gross per 24 hour  Intake 300 ml  Output 425 ml  Net -125 ml      Physical Exam    General:  Well appearing. No resp difficulty HEENT: normal Neck: supple. no JVD. Carotids 2+ bilat; no bruits. No lymphadenopathy or thryomegaly appreciated. Cor: PMI nondisplaced. Regular rate & rhythm. No rubs, gallops or murmurs. Lungs: clear Abdomen: soft, nontender, nondistended. No hepatosplenomegaly. No bruits or masses. Good bowel sounds. Extremities: no cyanosis, clubbing, rash, 1+ edema + TED Neuro: alert & orientedx3, cranial nerves grossly intact. moves all 4 extremities w/o difficulty. Affect pleasant   Telemetry    NSR 60s with periods of junctional rhythm in 40s.Personally reviewed   Labs    CBC Recent Labs    03/16/20 0358  WBC 7.7  HGB 14.9  HCT 45.7   MCV 89.6  PLT 254   Basic Metabolic Panel Recent Labs    47/82/95 0346 03/14/20 0346 03/15/20 0426 03/16/20 0358  NA 137   < > 137 135  K 4.6   < > 4.6 5.3*  CL 98   < > 99 99  CO2 26   < > 28 24  GLUCOSE 95   < > 93 129*  BUN 22   < > 25* 29*  CREATININE 1.12   < > 1.36* 1.12  CALCIUM 9.4   < > 9.2 9.7  MG 2.1  --   --   --    < > = values in this interval not displayed.   Liver Function Tests No results for input(s): AST, ALT, ALKPHOS, BILITOT, PROT, ALBUMIN in the last 72 hours. No results for input(s): LIPASE, AMYLASE in the last 72 hours. Cardiac Enzymes No results for input(s): CKTOTAL, CKMB, CKMBINDEX, TROPONINI in the last 72 hours.  BNP: BNP (last 3 results) Recent Labs    01/23/20 0338 03/12/20 1127  BNP 1,370.5* 533.8*    ProBNP (last 3 results) No results for input(s): PROBNP in the last 8760 hours.   D-Dimer No results for input(s): DDIMER in the last 72 hours. Hemoglobin A1C No results for input(s): HGBA1C in the last 72 hours. Fasting Lipid Panel No results for input(s): CHOL, HDL, LDLCALC, TRIG, CHOLHDL, LDLDIRECT in the last 72 hours. Thyroid Function Tests No results for input(s):  TSH, T4TOTAL, T3FREE, THYROIDAB in the last 72 hours.  Invalid input(s): FREET3  Other results:   Imaging    EP STUDY  Result Date: 03/15/2020  SURGEON:  Allegra Lai, MD    PREPROCEDURE DIAGNOSIS:  Atrial flutter.    POSTPROCEDURE DIAGNOSIS:  Isthmus-dependent counter clockwise right atrial flutter.    PROCEDURES:  1. Comprehensive EP study.  2. Coronary sinus pacing and recording.  3. Mapping of supraventricular tachycardia.  4. Radiofrequency ablation of supraventricular tachycardia.    INTRODUCTION: Corey Brady is a 64 y.o. male with a history of typical appearing atrial flutter who presents today for EP study and radiofrequency ablation.  The patient recently developed symptoms of weakness and fatigue for which he  was found to have atrial flutter with  elevated ventricular rates.  The patient remains symptomatic despite rate control.  The patient has been adequately anticoagulated for three weeks and now presents for EP study and radiofrequency ablation of atrial  flutter.    DESCRIPTION OF PROCEDURE:  Informed written consent was obtained and the  patient was brought to the Electrophysiology Lab in the fasting state. The patient was very clear that she had been compliant with eliquis over the past 3 weeks without interruption.  The patient was adequately sedated with intravenous medication as outlined in the anesthesia report.  The patient's right groin was prepped and draped in the usual sterile fashion by the EP Lab staff.  Using a percutaneous Seldinger technique, two 6-French and one 8-French hemostasis sheaths were placed  into the right common femoral vein.  A 6-French decapolar Polaris X catheter was introduced through the right common femoral vein and advanced into the coronary sinus for recording and pacing from this location.  A 6-French quadripolar Josephson catheter was introduced through the right common femoral vein and advanced into the right  ventricle for recording and pacing.  This catheter was then pulled back to the His bundle location.  Direct ultrasound guidance is used for right femoral vein with normal vessel patency. Ultrasound images are captured and stored in the patient's chart. Using ultrasound guidance, the Brockenbrough needle and wire were visualized entering the vessel. Presenting Measurements: The patient presented to the Electrophysiology Lab in atrial flutter.  The surface electrocardiogram was consistent with typical atrial flutter.  The atrial flutter cycle length was 300 milliseconds.  The coronary sinus catheter activation revealed proximal to distal activation and was therefore suggestive of right atrial flutter.  The patient's QRS duration was 116 milliseconds with a QT interval of 474 milliseconds and an HV interval of  40 milliseconds. Entrainment and Mapping: Entrainment was performed from the left atrium, which revealed a long postpacing interval.  A Emergency planning/management officer II XB 8-mm ablation catheter was introduced through the right common femoral vein and advanced into the right atrium.  The catheter was positioned along the cavotricuspid isthmus.  Entrainment mapping was performed from the cavotricuspid isthmus.  The postpacing interval was equal to the tachycardia cycle length when pacing in this location during entrainment.  The patient was therefore felt to have isthmus-dependent right atrial flutter.  Mapping was performed along the atrial side of the cavotricuspid isthmus.  This demonstrated a moderate-sized isthmus.  I therefore elected to perform cavotricuspid isthmus ablation today. Ablation: The ablation catheter was therefore positioned along the cavotricuspid isthmus and a series of radiofrequency applications were delivered with a target temperature of 60 degrees of 50 watts for 120 seconds each.  The tachycardia slowed and then terminated during  radiofrequency  application.  Additional mapping of the atrial signal was performed with  additional ablation performed.  A 7-French Biosense Webster dual decapolar halo catheter was introduced through the right common femoral vein and advanced into the right atrium.  This catheter was positioned around the tricuspid valve annulus.  This demonstrated that the patient continued to have conduction through the cavotricuspid isthmus.  An 8-  [place] RAMP sheath was therefore advanced through the right common  femoral vein into the right atrium.  The ablation catheter was positioned through the RAMP sheath along the cavotricuspid isthmus and an additional radiofrequency applications were delivered with a target temperature of 60 degrees at 50 watts.  Following bonus radiofrequency applications, complete bidirectional isthmus block was  achieved as evident by differential  atrial pacing from the low lateral right atrium.  The patient was observed for 20 minutes without return of conduction through the cavotricuspid isthmus.  Measurements following ablation: Following ablation, the stimulus to earliest atrial activation recorded across the isthmus line measured 200 milliseconds.  The AH interval measured 80 milliseconds with an HV interval of 46 milliseconds.  Atrial pacing was performed, which revealed decremental AV conduction with no evidence of PR greater than RR.  The AV Wenckebach cycle length was 460 milliseconds.  Ventricular pacing was performed, which revealed midline decremental VA conduction with a retrograde VA block of 420 milliseconds.  No arrhythmias were induced. The procedure was therefore considered completed.  All catheters were removed and the sheaths were aspirated and flushed.  The sheaths were removed and hemostasis was assured. EBL<29ml.  There were no early apparent complications.    CONCLUSIONS:  1. Isthmus-dependent right atrial flutter upon presentation.  2. Successful radiofrequency ablation of atrial flutter along the cavotricuspid isthmus with complete bidirectional isthmus block achieved.  3. No inducible arrhythmias following ablation.  4. No early apparent complications.     Medications:     Scheduled Medications: . apixaban  5 mg Oral BID  . aspirin  81 mg Oral Daily  . atorvastatin  40 mg Oral Daily  . doxycycline  100 mg Oral Q12H  . hydrocerin   Topical BID  . losartan  12.5 mg Oral Daily  . sodium chloride flush  3 mL Intravenous Q12H  . sodium chloride flush  3 mL Intravenous Q12H  . spironolactone  25 mg Oral QHS    Infusions: . sodium chloride 10 mL/hr at 03/15/20 1325  . sodium chloride      PRN Medications: sodium chloride, sodium chloride, acetaminophen, ondansetron (ZOFRAN) IV, sodium chloride flush, sodium chloride flush     Assessment/Plan    1.Acute/Chronic Systolic Heart Failure - ECHO EF 15% with  severe global HK, and moderate RV dysfunction. Etiology possible ETOH versus HTN (denies severe ETOH use). TSH was ok. HIV NR  - 4/28 LHC with severe 1v CAD. RHC with preserved cardiac output.  - S/p CABG x 1 w/ LIMA-LAD 5/4 -NYHAII. Admitted 6/15 w/ volume overload in the setting of recurrent AFL -Overall diuresed 12 pounds.  - Volume status still slightly elevated. Continue lasix 40 daily -K 5.3 stop spiro - Continue losartan 12.5. BP too soft for Entresto -Has junctional rhythm. So will hold off on b blocker and spiro for now.  2. CAD:  - LHC 12/2019 with severe 1VCAD - S/p CABG x 1 w/ LIMA-LAD 01/30/20 - No s/s angina  - on ASA 81 + statin, atorvastatin 40 mg  3.Bilateral lower extremity cellulitis - PCP 5/21startedKeflexx 7 days-->now with  bilateral cellulitis.  - much improved w/ IV vanc and has transitioned to doxycyline.  - Plan to completed 7 days. No changes  4. Atrial Flutter  -s/p ablation 6/18 - in/out sinus/junctional - off amio and dig - Continue eliquis 5 mg twice a day.  - Discussed w/ EP. No role for PPM/ICD yet.   5. Deconditioning - CR/PT/OT to see    Length of Stay: 4  Arvilla Meres, MD  03/16/2020, 1:42 PM  Advanced Heart Failure Team Pager [phone] (M-F; 7a - 4p)  Please contact [facility] for night-coverage after hours (4p -7a ) and weekends on [email or web address]

## 2020-03-17 LAB — BASIC METABOLIC PANEL
Anion gap: 9 (ref 5–15)
BUN: 25 mg/dL — ABNORMAL HIGH (ref 8–23)
CO2: 26 mmol/L (ref 22–32)
Calcium: 9.3 mg/dL (ref 8.9–10.3)
Chloride: 100 mmol/L (ref 98–111)
Creatinine, Ser: 1.03 mg/dL (ref 0.61–1.24)
GFR calc Af Amer: >60 mL/min (ref >60)
GFR calc non Af Amer: >60 mL/min (ref >60)
Glucose, Bld: 85 mg/dL (ref 70–99)
Potassium: 4.8 mmol/L (ref 3.5–5.1)
Sodium: 135 mmol/L (ref 135–145)

## 2020-03-17 MED ORDER — LOSARTAN POTASSIUM 25 MG PO TABS: 25.0000 mg | ORAL_TABLET | Freq: Every day | ORAL | Status: DC

## 2020-03-17 MED ORDER — FUROSEMIDE 40 MG PO TABS
40.0000 mg | ORAL_TABLET | Freq: Every day | ORAL | Status: DC
  Administered 2020-03-17 – 2020-03-18 (×2): 40 mg via ORAL
  Filled 2020-03-17 (×2): qty 1

## 2020-03-17 MED ORDER — SPIRONOLACTONE 12.5 MG HALF TABLET
12.5000 mg | ORAL_TABLET | Freq: Every day | ORAL | Status: DC
  Administered 2020-03-17 – 2020-03-18 (×2): 12.5 mg via ORAL
  Filled 2020-03-17 (×2): qty 1

## 2020-03-17 MED ORDER — LOSARTAN POTASSIUM 25 MG PO TABS
25.0000 mg | ORAL_TABLET | Freq: Every day | ORAL | Status: DC
  Administered 2020-03-17 – 2020-03-18 (×2): 25 mg via ORAL
  Filled 2020-03-17: qty 1

## 2020-03-17 NOTE — Evaluation (Signed)
Occupational Therapy Evaluation Patient Details Name: Corey Brady MRN: [id number] DOB: 08-23-1956 Today's Date: 03/17/2020    History of Present Illness 64 y/o male admitted on 03/12/20 for aflutter, CHF, recurrent ALF and suspected LE cellulitis. S/P  ablation 6/18. PMH CAD, severe systolic HF, PAFL, CABG 03/04/60.   Clinical Impression   PTA, pt was living with his brother and was independent. Currently, pt performing ADLs and functional mobility at Mod I- Independent level. Reviewing sternal precautions and compensatory techniques for ADLs. Answered all pt questions. Recommend dc home once medically stable per physician. All acute OT needs met and will sign off. Thank you.    Follow Up Recommendations  No OT follow up    Equipment Recommendations  None recommended by OT    Recommendations for Other Services       Precautions / Restrictions Precautions Precautions: None Precaution Comments: Maintaining sternal precautions      Mobility Bed Mobility Overal bed mobility: Independent                Transfers Overall transfer level: Independent                    Balance Overall balance assessment: No apparent balance deficits (not formally assessed)                                         ADL either performed or assessed with clinical judgement   ADL Overall ADL's : Modified independent                                       General ADL Comments: Pt performing ADLs and functional mobility wiht slight increaed time for adhering to sternal precautions. Reviewing compensatory tehcniques for stenral precautions and ADLs.      Vision Baseline Vision/History: Wears glasses Wears Glasses: Distance only Patient Visual Report: No change from baseline       Perception     Praxis      Pertinent Vitals/Pain Pain Assessment: No/denies pain     Hand Dominance Left   Extremity/Trunk Assessment Upper Extremity  Assessment Upper Extremity Assessment: Overall WFL for tasks assessed   Lower Extremity Assessment Lower Extremity Assessment: Overall WFL for tasks assessed       Communication Communication Communication: No difficulties   Cognition Arousal/Alertness: Awake/alert Behavior During Therapy: WFL for tasks assessed/performed Overall Cognitive Status: Within Functional Limits for tasks assessed                                     General Comments  HR 60s-70s during activity    Exercises     Shoulder Instructions      Home Living Family/patient expects to be discharged to:: Private residence Living Arrangements: Other relatives (Brother) Available Help at Discharge: Family;Available PRN/intermittently Type of Home: House Home Access: Stairs to enter [employer] of Steps: 6 Entrance Stairs-Rails: Right;Left Home Layout: One level     Bathroom Shower/Tub: Walk-in shower;Tub/shower unit (Works being done at his shower; goes to [employer])   Biochemist, clinical: [place]: Environmental consultant - 2 wheels;Bedside commode   Additional Comments: Brother works 2nd shift       Prior Functioning/Environment Level of  Independence: Independent        Comments: Brother assists with compression stockings to maintain sternal precautions        OT Problem List: Decreased activity tolerance      OT Treatment/Interventions:      OT Goals(Current goals can be found in the care plan section) Acute Rehab OT Goals Patient Stated Goal: Go home soon OT Goal Formulation: All assessment and education complete, DC therapy  OT Frequency:     Barriers to D/C:            Co-evaluation              AM-PAC OT "6 Clicks" Daily Activity     Outcome Measure Help from another person eating meals?: None Help from another person taking care of personal grooming?: None Help from another person toileting, which includes using toliet, bedpan, or  urinal?: None Help from another person bathing (including washing, rinsing, drying)?: None Help from another person to put on and taking off regular upper body clothing?: None Help from another person to put on and taking off regular lower body clothing?: None 6 Click Score: 24   End of Session Nurse Communication: Mobility status  Activity Tolerance: Patient tolerated treatment well Patient left: in chair;with call bell/phone within reach  OT Visit Diagnosis: Muscle weakness (generalized) (M62.81)                Time: 4098-1191 OT Time Calculation (min): 17 min Charges:  OT General Charges $OT Visit: 1 Visit OT Evaluation $OT Eval Low Complexity: 1 Low  Tamantha Saline MSOT, OTR/L Acute Rehab Pager: [phone] Office: [place] 03/17/2020, 10:15 AM

## 2020-03-17 NOTE — Progress Notes (Signed)
Advanced Heart Failure Rounding Note  PCP-Cardiologist: No primary care provider on file.    Patient Profile   64 y/o male w/ CAD, severe systolic HF and PAFL. Recently underwent CABG. Post-op course c/b low output and PAFL requiring several DC-CV. Amiodarone recently reduced due to bradycardia.   Now readmitted w/ a/c CHF, recurrent ALF and suspected LE cellulitis.   Subjective:    Underwent AFL ablation 6/19. Post ablation had periods of junctional rhythm. Dig and amio held. Now resolved.   Feels good. Off diuretics. Weight up 5 pounds (doubt accurate)  Working with OT today for ambulation.   Denies SOB orthopnea or PND. On Eliquis No bleeding  Objective:   Weight Range: 98.1 kg Body mass index is 29.34 kg/m.   Vital Signs:   Temp:  [97.3 F (36.3 C)-97.6 F (36.4 C)] 97.6 F (36.4 C) (06/20 0431) Pulse Rate:  [46-55] 46 (06/19 2351) Resp:  [18-20] 20 (06/20 0431) BP: (110-136)/(69-81) 110/69 (06/20 0431) SpO2:  [94 %-98 %] 98 % (06/20 0431) Weight:  [98.1 kg] 98.1 kg (06/20 0431) Last BM Date: 03/16/20  Weight change: Filed Weights   03/15/20 0307 03/16/20 0349 03/17/20 0431  Weight: 96.7 kg 96 kg 98.1 kg    Intake/Output:   Intake/Output Summary (Last 24 hours) at 03/17/2020 0955 Last data filed at 03/17/2020 0400 Gross per 24 hour  Intake 687 ml  Output 1675 ml  Net -988 ml      Physical Exam    General:  Well appearing. No resp difficulty HEENT: normal Neck: supple. no JVD. Carotids 2+ bilat; no bruits. No lymphadenopathy or thryomegaly appreciated. Cor: PMI nondisplaced. Regular rate & rhythm. No rubs, gallops or murmurs. Lungs: clear Abdomen: soft, nontender, nondistended. No hepatosplenomegaly. No bruits or masses. Good bowel sounds. Extremities: no cyanosis, clubbing, rash, tr edema + TED Neuro: alert & orientedx3, cranial nerves grossly intact. moves all 4 extremities w/o difficulty. Affect pleasant   Telemetry    NSR 60s Personally  reviewed    Labs    CBC Recent Labs    03/16/20 0358  WBC 7.7  HGB 14.9  HCT 45.7  MCV 89.6  PLT 254   Basic Metabolic Panel Recent Labs    66/06/30 0358 03/17/20 0643  NA 135 135  K 5.3* 4.8  CL 99 100  CO2 24 26  GLUCOSE 129* 85  BUN 29* 25*  CREATININE 1.12 1.03  CALCIUM 9.7 9.3   Liver Function Tests No results for input(s): AST, ALT, ALKPHOS, BILITOT, PROT, ALBUMIN in the last 72 hours. No results for input(s): LIPASE, AMYLASE in the last 72 hours. Cardiac Enzymes No results for input(s): CKTOTAL, CKMB, CKMBINDEX, TROPONINI in the last 72 hours.  BNP: BNP (last 3 results) Recent Labs    01/23/20 0338 03/12/20 1127  BNP 1,370.5* 533.8*    ProBNP (last 3 results) No results for input(s): PROBNP in the last 8760 hours.   D-Dimer No results for input(s): DDIMER in the last 72 hours. Hemoglobin A1C No results for input(s): HGBA1C in the last 72 hours. Fasting Lipid Panel No results for input(s): CHOL, HDL, LDLCALC, TRIG, CHOLHDL, LDLDIRECT in the last 72 hours. Thyroid Function Tests No results for input(s): TSH, T4TOTAL, T3FREE, THYROIDAB in the last 72 hours.  Invalid input(s): FREET3  Other results:   Imaging    No results found.   Medications:     Scheduled Medications: . apixaban  5 mg Oral BID  . aspirin  81 mg Oral  Daily  . atorvastatin  40 mg Oral Daily  . doxycycline  100 mg Oral Q12H  . hydrocerin   Topical BID  . losartan  12.5 mg Oral Daily  . sodium chloride flush  3 mL Intravenous Q12H  . sodium chloride flush  3 mL Intravenous Q12H    Infusions: . sodium chloride 10 mL/hr at 03/15/20 1325  . sodium chloride      PRN Medications: sodium chloride, sodium chloride, acetaminophen, ondansetron (ZOFRAN) IV, sodium chloride flush, sodium chloride flush     Assessment/Plan    1.Acute/Chronic Systolic Heart Failure - ECHO EF 15% with severe global HK, and moderate RV dysfunction. Etiology possible ETOH versus  HTN (denies severe ETOH use). TSH was ok. HIV NR  - 4/28 LHC with severe 1v CAD. RHC with preserved cardiac output.  - S/p CABG x 1 w/ LIMA-LAD 5/4 -NYHAII. Admitted 6/15 w/ volume overload in the setting of recurrent AFL -Overall diuresed 12 pounds.  - Volume status looks ok. Start lasix 40 daily po. Can increase as needed -K 4.8 spiro stopped yesterday (was also on K supp though) Restart spiro tomorrow am - Increase losartan - avoiding b-blocker with recent junctional rhythm   2. CAD:  - LHC 12/2019 with severe 1VCAD - S/p CABG x 1 w/ LIMA-LAD 01/30/20 - No s/s angina - on ASA 81 + statin, atorvastatin 40 mg  3.Bilateral lower extremity cellulitis - PCP 5/21startedKeflexx 7 days-->now with bilateral cellulitis.  - much improved w/ IV vanc and has transitioned to doxycyline.  - Plan to complete 7 days o doxy   4. Atrial Flutter  -s/p ablation 6/18. Periods of junctional rhythm post ablation - off amio and dig -> improved - Continue eliquis 5 mg twice a day.  - Discussed w/ EP. No role for PPM/ICD yet.   5. Deconditioning - CR/PT/OT to see   Possibly home in am.   Length of Stay: [place], MD  03/17/2020, 9:55 AM  Advanced Heart Failure Team Pager [phone] (M-F; 7a - 4p)  Please contact [facility] for night-coverage after hours (4p -7a ) and weekends on [email or web address]

## 2020-03-18 ENCOUNTER — Telehealth (HOSPITAL_COMMUNITY): Payer: Self-pay | Admitting: Licensed Clinical Social Worker

## 2020-03-18 ENCOUNTER — Encounter (HOSPITAL_COMMUNITY): Payer: Self-pay | Admitting: Cardiology

## 2020-03-18 DIAGNOSIS — I483 Typical atrial flutter: Secondary | ICD-10-CM

## 2020-03-18 LAB — BASIC METABOLIC PANEL
Anion gap: 8 (ref 5–15)
BUN: 24 mg/dL — ABNORMAL HIGH (ref 8–23)
CO2: 28 mmol/L (ref 22–32)
Calcium: 9.2 mg/dL (ref 8.9–10.3)
Chloride: 102 mmol/L (ref 98–111)
Creatinine, Ser: 1.17 mg/dL (ref 0.61–1.24)
GFR calc Af Amer: >60 mL/min (ref >60)
GFR calc non Af Amer: >60 mL/min (ref >60)
Glucose, Bld: 86 mg/dL (ref 70–99)
Potassium: 4.8 mmol/L (ref 3.5–5.1)
Sodium: 138 mmol/L (ref 135–145)

## 2020-03-18 MED ORDER — LOSARTAN POTASSIUM 25 MG PO TABS: 25.0000 mg | ORAL_TABLET | Freq: Every day | ORAL | 5 refills | Status: DC

## 2020-03-18 MED ORDER — FUROSEMIDE 40 MG PO TABS: 40.0000 mg | ORAL_TABLET | Freq: Every day | ORAL | 5 refills | Status: DC

## 2020-03-18 MED ORDER — SPIRONOLACTONE 25 MG PO TABS: 12.5000 mg | ORAL_TABLET | Freq: Every day | ORAL | 3 refills | Status: DC

## 2020-03-18 MED ORDER — DOXYCYCLINE HYCLATE 100 MG PO TABS: 100.0000 mg | ORAL_TABLET | Freq: Two times a day (BID) | ORAL | 0 refills | Status: DC

## 2020-03-18 NOTE — Telephone Encounter (Signed)
CSW called pt brother, Marina Goodell, to check in regarding progress getting required financial documents for CAFA application.  Pt brother reports that they plan to get the bank statements today after pt DCs from the hospital and will fax directly to financial counselor.  After 3 months of bank statements are in pt will just need to turn in non filing form- CSW sent in request last week so will hopefully be arriving in the mail in the next week or so.  Will continue to follow and assist as needed  Burna Sis, LCSW Clinical Social Worker [facility] Desk#: [phone] Cell#: [phone]

## 2020-03-18 NOTE — Anesthesia Postprocedure Evaluation (Signed)
Anesthesia Post Note  Patient: Corey Brady  Procedure(s) Performed: A-FLUTTER ABLATION (N/A )     Patient location during evaluation: PACU Anesthesia Type: MAC Level of consciousness: awake and alert Pain management: pain level controlled Vital Signs Assessment: post-procedure vital signs reviewed and stable Respiratory status: spontaneous breathing, nonlabored ventilation and respiratory function stable Cardiovascular status: stable and blood pressure returned to baseline Anesthetic complications: no   No complications documented.  Last Vitals:  Vitals:   03/18/20 0409 03/18/20 0800  BP: 108/68 105/63  Pulse: (!) 58   Resp:    Temp: 36.4 C   SpO2: 99% 100%    Last Pain:  Vitals:   03/18/20 0800  TempSrc:   PainSc: 0-No pain                 Audry Pili

## 2020-03-18 NOTE — Progress Notes (Signed)
CARDIAC REHAB PHASE I   PRE:  Rate/Rhythm: 63 SR    BP: sitting 116/70    SaO2:   MODE:  Ambulation: 470 ft   POST:  Rate/Rhythm: 74 SR with PACs/PVCs    BP: sitting 129/63     SaO2: 95 RA  Tolerated ambulation well, no c/o. Reviewed HF booklet. Pt has been keeping to 1500 mg sodium diet with healthy foods. He is weighing daily  and taking his meds. He has not been formally exercising so he will start walking and/or riding his stationary bike with progression to 30 min + most days. Gave him Off the Beat booklet for review. Pt receptive to education.  [id number]   Harriet Masson CES, ACSM 03/18/2020 9:40 AM

## 2020-03-18 NOTE — Discharge Instructions (Signed)
Atrial Flutter  Atrial flutter is a type of abnormal heart rhythm (arrhythmia). The heart has an electrical system that tells it how to beat. In atrial flutter, the signals move rapidly in the top chambers of the heart (the atria). This makes your heart beat very fast. Atrial flutter can come and go, or it can be permanent. The goal of treatment is to prevent blood clots from forming, control your heart rate, or restore your heartbeat to a normal rhythm. If this condition is not treated, it can cause serious problems, such as a weakened heart muscle (cardiomyopathy) or a stroke. What are the causes? This condition is often caused by conditions that damage the heart's electrical system. These include:  Heart conditions and heart surgery. These include heart attacks and open-heart surgery.  Lung problems, such as COPD or a blood clot in the lung (pulmonary embolism, or PE).  Poorly controlled high blood pressure (hypertension).  Overactive thyroid (hyperthyroidism).  Diabetes. In some cases, the cause of this condition is not known. What increases the risk? You are more likely to develop this condition if:  You are an elderly adult.  You are a man.  You are overweight (obese).  You have obstructive sleep apnea.  You have a family history of atrial flutter.  You have diabetes.  You drink a lot of alcohol, especially binge drinking.  You use drugs, including cannabis.  You smoke. What are the signs or symptoms? Symptoms of this condition include:  A feeling that your heart is pounding or racing (palpitations).  Shortness of breath.  Chest pain.  Feeling dizzy or light-headed.  Fainting.  Low blood pressure (hypotension).  Fatigue.  Tiring easily during exercise or activity. In some cases, there are no symptoms. How is this diagnosed? This condition may be diagnosed with:  An electrocardiogram (ECG) to check electrical signals of the heart.  An ambulatory  cardiac monitor to record your heart's activity for a few days.  An echocardiogram to create pictures of your heart.  A transesophageal echocardiogram (TEE) to create even better pictures of your heart.  A stress test to check your blood supply while you exercise.  Imaging tests, such as a CT scan or chest X-ray.  Blood tests. How is this treated? Treatment depends on underlying conditions and how you feel when you experience atrial flutter. This condition may be treated with:  Medicines to prevent blood clots or to treat heart rate or heart rhythm problems.  Electrical cardioversion to reset the heart's rhythm.  Ablation to remove the heart tissue that sends abnormal signals.  Left atrial appendage closure to seal the area where blood clots can form. In some cases, underlying conditions will be treated. Follow these instructions at home: Medicines  Take over-the-counter and prescription medicines only as told by your health care provider.  Do not take any new medicines without talking to your health care provider.  If you are taking blood thinners: ? Talk with your health care provider before you take any medicines that contain aspirin or NSAIDs, such as ibuprofen. These medicines increase your risk for dangerous bleeding. ? Take your medicine exactly as told, at the same time every day. ? Avoid activities that could cause injury or bruising, and follow instructions about how to prevent falls. ? Wear a medical alert bracelet or carry a card that lists what medicines you take. Lifestyle  Eat heart-healthy foods. Talk with a dietitian to make an eating plan that is right for you.  Do   not use any products that contain nicotine or tobacco, such as cigarettes, e-cigarettes, and chewing tobacco. If you need help quitting, ask your health care provider.  Do not drink alcohol.  Do not use drugs, including cannabis.  Lose weight if you are overweight or obese.  Exercise  regularly as instructed by your health care provider. General instructions  Do not use diet pills unless your health care provider approves. Diet pills may make heart problems worse.  If you have obstructive sleep apnea, manage your condition as told by your health care provider.  Keep all follow-up visits as told by your health care provider. This is important. Contact a health care provider if you:  Notice a change in the rate, rhythm, or strength of your heartbeat.  Are taking a blood thinner and you notice more bruising.  Have a sudden change in weight.  Tire more easily when you exercise or do heavy work. Get help right away if you have:  Pain or pressure in your chest.  Shortness of breath.  Fainting.  Increasing sweating with no known cause.  Side effects of blood thinners, such as blood in your vomit, stool, or urine, or bleeding that cannot stop.  Any symptoms of a stroke. "BE FAST" is an easy way to remember the main warning signs of a stroke: ? B - Balance. Signs are dizziness, sudden trouble walking, or loss of balance. ? E - Eyes. Signs are trouble seeing or a sudden change in vision. ? F - Face. Signs are sudden weakness or numbness of the face, or the face or eyelid drooping on one side. ? A - Arms. Signs are weakness or numbness in an arm. This happens suddenly and usually on one side of the body. ? S - Speech. Signs are sudden trouble speaking, slurred speech, or trouble understanding what people say. ? T - Time. Time to call emergency services. Write down what time symptoms started.  Other signs of a stroke, such as: ? A sudden, severe headache with no known cause. ? Nausea or vomiting. ? Seizure.  These symptoms may represent a serious problem that is an emergency. Do not wait to see if the symptoms will go away. Get medical help right away. Call your local emergency services (911 in the [place] Do not drive yourself to the hospital. Summary  Atrial  flutter is an abnormal heart rhythm that can give you symptoms of palpitations, shortness of breath, or fatigue.  Atrial flutter is often treated with medicines to keep your heart in a normal rhythm and to prevent a stroke.  Get help right away if you cannot catch your breath, or have chest pain or pressure.  Get help right away if you have signs or symptoms of a stroke. This information is not intended to replace advice given to you by your health care provider. Make sure you discuss any questions you have with your health care provider. Document Revised: 03/08/2019 Document Reviewed: 03/08/2019 Elsevier Patient Education  2020 Else[employer] Inc.    Heart-Healthy Eating Plan Heart-healthy meal planning includes:  Eating less unhealthy fats.  Eating more healthy fats.  Making other changes in your diet. Talk with your doctor or a diet specialist (dietitian) to create an eating plan that is right for you. What is my plan? Your doctor may recommend an eating plan that includes:  Total fat: ______% or less of total calories a day.  Saturated fat: ______% or less of total calories a day.  Cholesterol: less  than _________mg a day. What are tips for following this plan? Cooking Avoid frying your food. Try to bake, boil, grill, or broil it instead. You can also reduce fat by:  Removing the skin from poultry.  Removing all visible fats from meats.  Steaming vegetables in water or broth. Meal planning   At meals, divide your plate into four equal parts: ? Fill one-half of your plate with vegetables and green salads. ? Fill one-fourth of your plate with whole grains. ? Fill one-fourth of your plate with lean protein foods.  Eat 4-5 servings of vegetables per day. A serving of vegetables is: ? 1 cup of raw or cooked vegetables. ? 2 cups of raw leafy greens.  Eat 4-5 servings of fruit per day. A serving of fruit is: ? 1 medium whole fruit. ?  cup of dried fruit. ?  cup of  fresh, frozen, or canned fruit. ?  cup of 100% fruit juice.  Eat more foods that have soluble fiber. These are apples, broccoli, carrots, beans, peas, and barley. Try to get 20-30 g of fiber per day.  Eat 4-5 servings of nuts, legumes, and seeds per week: ? 1 serving of dried beans or legumes equals  cup after being cooked. ? 1 serving of nuts is  cup. ? 1 serving of seeds equals 1 tablespoon. General information  Eat more home-cooked food. Eat less restaurant, buffet, and fast food.  Limit or avoid alcohol.  Limit foods that are high in starch and sugar.  Avoid fried foods.  Lose weight if you are overweight.  Keep track of how much salt (sodium) you eat. This is important if you have high blood pressure. Ask your doctor to tell you more about this.  Try to add vegetarian meals each week. Fats  Choose healthy fats. These include olive oil and canola oil, flaxseeds, walnuts, almonds, and seeds.  Eat more omega-3 fats. These include salmon, mackerel, sardines, tuna, flaxseed oil, and ground flaxseeds. Try to eat fish at least 2 times each week.  Check food labels. Avoid foods with trans fats or high amounts of saturated fat.  Limit saturated fats. ? These are often found in animal products, such as meats, butter, and cream. ? These are also found in plant foods, such as palm oil, palm kernel oil, and coconut oil.  Avoid foods with partially hydrogenated oils in them. These have trans fats. Examples are stick margarine, some tub margarines, cookies, crackers, and other baked goods. What foods can I eat? Fruits All fresh, canned (in natural juice), or frozen fruits. Vegetables Fresh or frozen vegetables (raw, steamed, roasted, or grilled). Green salads. Grains Most grains. Choose whole wheat and whole grains most of the time. Rice and pasta, including brown rice and pastas made with whole wheat. Meats and other proteins Lean, well-trimmed beef, veal, pork, and lamb.  Chicken and [place] without skin. All fish and shellfish. Wild duck, rabbit, pheasant, and venison. Egg whites or low-cholesterol egg substitutes. Dried beans, peas, lentils, and tofu. Seeds and most nuts. Dairy Low-fat or nonfat cheeses, including ricotta and mozzarella. Skim or 1% milk that is liquid, powdered, or evaporated. Buttermilk that is made with low-fat milk. Nonfat or low-fat yogurt. Fats and oils Non-hydrogenated (trans-free) margarines. Vegetable oils, including soybean, sesame, sunflower, olive, peanut, safflower, corn, canola, and cottonseed. Salad dressings or mayonnaise made with a vegetable oil. Beverages Mineral water. Coffee and tea. Diet carbonated beverages. Sweets and desserts Sherbet, gelatin, and fruit ice. Small amounts of dark  chocolate. Limit all sweets and desserts. Seasonings and condiments All seasonings and condiments. The items listed above may not be a complete list of foods and drinks you can eat. Contact a dietitian for more options. What foods should I avoid? Fruits Canned fruit in heavy syrup. Fruit in cream or butter sauce. Fried fruit. Limit coconut. Vegetables Vegetables cooked in cheese, cream, or butter sauce. Fried vegetables. Grains Breads that are made with saturated or trans fats, oils, or whole milk. Croissants. Sweet rolls. Donuts. High-fat crackers, such as cheese crackers. Meats and other proteins Fatty meats, such as hot dogs, ribs, sausage, bacon, rib-eye roast or steak. High-fat deli meats, such as salami and bologna. Caviar. Domestic duck and goose. Organ meats, such as liver. Dairy Cream, sour cream, cream cheese, and creamed cottage cheese. Whole-milk cheeses. Whole or 2% milk that is liquid, evaporated, or condensed. Whole buttermilk. Cream sauce or high-fat cheese sauce. Yogurt that is made from whole milk. Fats and oils Meat fat, or shortening. Cocoa butter, hydrogenated oils, palm oil, coconut oil, palm kernel oil. Solid fats and  shortenings, including bacon fat, salt pork, lard, and butter. Nondairy cream substitutes. Salad dressings with cheese or sour cream. Beverages Regular sodas and juice drinks with added sugar. Sweets and desserts Frosting. Pudding. Cookies. Cakes. Pies. Milk chocolate or white chocolate. Buttered syrups. Full-fat ice cream or ice cream drinks. The items listed above may not be a complete list of foods and drinks to avoid. Contact a dietitian for more information. Summary  Heart-healthy meal planning includes eating less unhealthy fats, eating more healthy fats, and making other changes in your diet.  Eat a balanced diet. This includes fruits and vegetables, low-fat or nonfat dairy, lean protein, nuts and legumes, whole grains, and heart-healthy oils and fats. This information is not intended to replace advice given to you by your health care provider. Make sure you discuss any questions you have with your health care provider. Document Revised: 11/18/2017 Document Reviewed: 10/22/2017 Elsevier Patient Education  2020 Elsevier Inc.    Cardiac Ablation, Care After This sheet gives you information about how to care for yourself after your procedure. Your health care provider may also give you more specific instructions. If you have problems or questions, contact your health care provider. What can I expect after the procedure? After the procedure, it is common to have:  Bruising around your puncture site.  Tenderness around your puncture site.  Skipped heartbeats.  Tiredness (fatigue). Follow these instructions at home: Puncture site care   Follow instructions from your health care provider about how to take care of your puncture site. Make sure you: ? Wash your hands with soap and water before you change your bandage (dressing). If soap and water are not available, use hand sanitizer. ? Change your dressing as told by your health care provider. ? Leave stitches (sutures), skin  glue, or adhesive strips in place. These skin closures may need to stay in place for up to 2 weeks. If adhesive strip edges start to loosen and curl up, you may trim the loose edges. Do not remove adhesive strips completely unless your health care provider tells you to do that.  Check your puncture site every day for signs of infection. Check for: ? Redness, swelling, or pain. ? Fluid or blood. If your puncture site starts to bleed, lie down on your back, apply firm pressure to the area, and contact your health care provider. ? Warmth. ? Pus or a bad smell. Driving  Ask your health care provider when it is safe for you to drive again after the procedure.  Do not drive or use heavy machinery while taking prescription pain medicine.  Do not drive for 24 hours if you were given a medicine to help you relax (sedative) during your procedure. Activity  Avoid activities that take a lot of effort for at least 3 days after your procedure.  Do not lift anything that is heavier than 10 lb (4.5 kg), or the limit that you are told, until your health care provider says that it is safe.  Return to your normal activities as told by your health care provider. Ask your health care provider what activities are safe for you. General instructions  Take over-the-counter and prescription medicines only as told by your health care provider.  Do not use any products that contain nicotine or tobacco, such as cigarettes and e-cigarettes. If you need help quitting, ask your health care provider.  Do not take baths, swim, or use a hot tub until your health care provider approves.  Do not drink alcohol for 24 hours after your procedure.  Keep all follow-up visits as told by your health care provider. This is important. Contact a health care provider if:  You have redness, mild swelling, or pain around your puncture site.  You have fluid or blood coming from your puncture site that stops after applying firm  pressure to the area.  Your puncture site feels warm to the touch.  You have pus or a bad smell coming from your puncture site.  You have a fever.  You have chest pain or discomfort that spreads to your neck, jaw, or arm.  You are sweating a lot.  You feel nauseous.  You have a fast or irregular heartbeat.  You have shortness of breath.  You are dizzy or light-headed and feel the need to lie down.  You have pain or numbness in the arm or leg closest to your puncture site. Get help right away if:  Your puncture site suddenly swells.  Your puncture site is bleeding and the bleeding does not stop after applying firm pressure to the area. These symptoms may represent a serious problem that is an emergency. Do not wait to see if the symptoms will go away. Get medical help right away. Call your local emergency services (911 in the [place] Do not drive yourself to the hospital. Summary  After the procedure, it is normal to have bruising and tenderness at the puncture site in your groin, neck, or forearm.  Check your puncture site every day for signs of infection.  Get help right away if your puncture site is bleeding and the bleeding does not stop after applying firm pressure to the area. This is a medical emergency. This information is not intended to replace advice given to you by your health care provider. Make sure you discuss any questions you have with your health care provider. Document Revised: 08/27/2017 Document Reviewed: 12/24/2016 Elsevier Patient Education  [place].

## 2020-03-18 NOTE — Progress Notes (Addendum)
Advanced Heart Failure Rounding Note  PCP-Cardiologist: No primary care provider on file.    Patient Profile   64 y/o male w/ CAD, severe systolic HF and PAFL. Recently underwent CABG. Post-op course c/b low output and PAFL requiring several DC-CV. Amiodarone recently reduced due to bradycardia.   Now readmitted w/ a/c CHF, recurrent ALF and suspected LE cellulitis.   Subjective:    Underwent AFL ablation 6/19. Post ablation had periods of junctional rhythm. Dig and amio held. Now resolved. Had 1 sinus pause overnight ~2.10 sec.   Back on PO diuretics. Wt down 1 lb from yesterday. BMP pending.   Feels well. Legs much improved. No complaints. Denies dyspnea. No CP.   Objective:   Weight Range: 97.8 kg Body mass index is 29.25 kg/m.   Vital Signs:   Temp:  [97.6 F (36.4 C)-98.6 F (37 C)] 97.6 F (36.4 C) (06/21 0409) Pulse Rate:  [58-69] 58 (06/21 0409) Resp:  [20] 20 (06/20 1730) BP: (103-108)/(61-68) 108/68 (06/21 0409) SpO2:  [96 %-99 %] 99 % (06/21 0409) Weight:  [97.8 kg] 97.8 kg (06/21 0409) Last BM Date: 03/16/20  Weight change: Filed Weights   03/16/20 0349 03/17/20 0431 03/18/20 0409  Weight: 96 kg 98.1 kg 97.8 kg    Intake/Output:   Intake/Output Summary (Last 24 hours) at 03/18/2020 0709 Last data filed at 03/18/2020 0350 Gross per 24 hour  Intake 804 ml  Output 3275 ml  Net -2471 ml      Physical Exam    General:  Well appearing. No respiratory difficulty HEENT: normal Neck: supple. no JVD. Carotids 2+ bilat; no bruits. No lymphadenopathy or thyromegaly appreciated. Cor: PMI nondisplaced. Regular rate & rhythm. No rubs, gallops or murmurs. Lungs: clear Abdomen: soft, nontender, nondistended. No hepatosplenomegaly. No bruits or masses. Good bowel sounds. Extremities: no cyanosis, clubbing, rash, edema Neuro: alert & oriented x 3, cranial nerves grossly intact. moves all 4 extremities w/o difficulty. Affect pleasant.    Telemetry    NSR mid 60s, 1 sinus pause overnight ~2.10 sec   Labs    CBC Recent Labs    03/16/20 0358  WBC 7.7  HGB 14.9  HCT 45.7  MCV 89.6  PLT 938   Basic Metabolic Panel Recent Labs    03/16/20 0358 03/17/20 0643  NA 135 135  K 5.3* 4.8  CL 99 100  CO2 24 26  GLUCOSE 129* 85  BUN 29* 25*  CREATININE 1.12 1.03  CALCIUM 9.7 9.3   Liver Function Tests No results for input(s): AST, ALT, ALKPHOS, BILITOT, PROT, ALBUMIN in the last 72 hours. No results for input(s): LIPASE, AMYLASE in the last 72 hours. Cardiac Enzymes No results for input(s): CKTOTAL, CKMB, CKMBINDEX, TROPONINI in the last 72 hours.  BNP: BNP (last 3 results) Recent Labs    01/23/20 0338 03/12/20 1127  BNP 1,370.5* 533.8*    ProBNP (last 3 results) No results for input(s): PROBNP in the last 8760 hours.   D-Dimer No results for input(s): DDIMER in the last 72 hours. Hemoglobin A1C No results for input(s): HGBA1C in the last 72 hours. Fasting Lipid Panel No results for input(s): CHOL, HDL, LDLCALC, TRIG, CHOLHDL, LDLDIRECT in the last 72 hours. Thyroid Function Tests No results for input(s): TSH, T4TOTAL, T3FREE, THYROIDAB in the last 72 hours.  Invalid input(s): FREET3  Other results:   Imaging    No results found.   Medications:     Scheduled Medications: . apixaban  5 mg Oral  BID  . aspirin  81 mg Oral Daily  . atorvastatin  40 mg Oral Daily  . doxycycline  100 mg Oral Q12H  . furosemide  40 mg Oral Daily  . hydrocerin   Topical BID  . losartan  25 mg Oral Daily  . sodium chloride flush  3 mL Intravenous Q12H  . sodium chloride flush  3 mL Intravenous Q12H  . spironolactone  12.5 mg Oral Daily    Infusions: . sodium chloride 10 mL/hr at 03/15/20 1325  . sodium chloride      PRN Medications: sodium chloride, sodium chloride, acetaminophen, ondansetron (ZOFRAN) IV, sodium chloride flush, sodium chloride flush     Assessment/Plan    1.Acute/Chronic Systolic Heart  Failure - ECHO EF 15% with severe global HK, and moderate RV dysfunction. Etiology possible ETOH versus HTN (denies severe ETOH use). TSH was ok. HIV NR  - 4/28 LHC with severe 1v CAD. RHC with preserved cardiac output.  - S/p CABG x 1 w/ LIMA-LAD 5/4 -NYHAII. Admitted 6/15 w/ volume overload in the setting of recurrent AFL -Overall diuresed 8 pounds.  - Volume status looks ok. Continue PO Lasix 40 mg daily . Can increase as needed Cleda Daub held over the weekend for hyperkalemia (supp K also d/c). Repeat BMP today, if K stable, restart spiro.  -Continue losartan 25 mg daily  - avoiding b-blocker with recent junctional rhythm   2. CAD:  - LHC 12/2019 with severe 1VCAD - S/p CABG x 1 w/ LIMA-LAD 01/30/20 - No s/s angina - on ASA 81 + statin, atorvastatin 40 mg  3.Bilateral lower extremity cellulitis - PCP 5/21startedKeflexx 7 days-->now with bilateral cellulitis.  - much improved w/ IV vanc and has transitioned to doxycyline.  - Plan to complete 7 days of doxy (stop 6/25)  4. Atrial Flutter  -s/p ablation 6/18. Periods of junctional rhythm post ablation - off amio and dig -> improved - Continue eliquis 5 mg twice a day.  - Discussed w/ EP. No role for PPM/ICD yet.   5. Deconditioning - CR/PT/OTevalutated  - no PT f/u needed   Plan d/c home today. He will continue to be followed by paramedicine.    Length of Stay: [address], PA-C  03/18/2020, 7:09 AM  Advanced Heart Failure Team Pager [phone] (M-F; 7a - 4p)  Please contact [facility] for night-coverage after hours (4p -7a ) and weekends on [email or web address]  Patient seen and examined with the above-signed Advanced Practice Provider and/or Housestaff. I personally reviewed laboratory data, imaging studies and relevant notes. I independently examined the patient and formulated the important aspects of the plan. I have edited the note to reflect any of my changes or salient points. I have personally discussed the  plan with the patient and/or family.  Remains in NSR. No further junctional rhythm. Volume status looks good. Walking halls with CR  General:  Well appearing. No resp difficulty HEENT: normal Neck: supple. no JVD. Carotids 2+ bilat; no bruits. No lymphadenopathy or thryomegaly appreciated. Cor: PMI nondisplaced. Regular rate & rhythm. No rubs, gallops or murmurs. Lungs: clear Abdomen: soft, nontender, nondistended. No hepatosplenomegaly. No bruits or masses. Good bowel sounds. Extremities: no cyanosis, clubbing, rash, edema Neuro: alert & orientedx3, cranial nerves grossly intact. moves all 4 extremities w/o difficulty. Affect pleasant  Ok for d/c today with HF and Paramedicine f/u. meds reviewed with him   Arvilla Meres, MD  10:55 AM

## 2020-03-18 NOTE — Discharge Summary (Signed)
Advanced Heart Failure Team  Discharge Summary   Patient ID: Corey Brady MRN: [id number], DOB/AGE: 1956/06/26 64 y.o. Admit date: 03/12/2020 D/C date:     03/18/2020   Primary Discharge Diagnoses:  Atrial Flutter w/ RVR S/p AFL Ablation  Junctional Rhythm Acute on Chronic Systolic Heart Failure Bilateral Lower Extremity Cellulitis   Secondary Discharge Diagnoses:  CAD s/p CABG  Hospital Course:   64 y/o male w/ CAD, severe systolic HF and PAFL. Recently underwent CABG. Post-op course c/b low output and PAFL requiring several DC-CV. Amiodarone recently reduced due to bradycardia.   Directly admitted from [facility] on 6/15 for a/c CHF w/ volume overload, recurrent ALF and bilateral  LE cellulitis.   He was loaded on IV amiodarone, started on IV lasix for CHF and started on IV vanc for cellulitis. No signs of systemic infection. WBC remained normal and he remained AF. Abx later changed to PO doxycycline. Had good diuresis w/ IV Lasix and changed back to PO diuretics. EP was consulted for AFL and he underwent  AFL ablation 6/19. Post ablation had periods of junctional rhythm. Dig and amio held and junctional rhythm resolved. EP recommended avoidance of rate control medications. No indication for PPM at this time.   On 6/21, he was seen and examined by Dr. Haroldine Laws and felt stable for discharge home. He will continue to be followed by para medicine once discharged. [facility] + EP clinic f/us were arranged.   Discharge Weight Range: 215 lb  Discharge Vitals: Blood pressure 105/63, pulse (!) 58, temperature 97.6 F (36.4 C), temperature source Oral, resp. rate 20, height 6' (1.829 m), weight 97.8 kg, SpO2 100 %.  Labs: Lab Results  Component Value Date   WBC 7.7 03/16/2020   HGB 14.9 03/16/2020   HCT 45.7 03/16/2020   MCV 89.6 03/16/2020   PLT 254 03/16/2020    Recent Labs  Lab 03/12/20 1127 03/13/20 0357 03/18/20 0727  NA 136   < > 138  K 4.4   < > 4.8  CL 102   < > 102  CO2 26   < >  28  BUN 17   < > 24*  CREATININE 1.06   < > 1.17  CALCIUM 9.4   < > 9.2  PROT 7.7  --   --   BILITOT 0.7  --   --   ALKPHOS 87  --   --   ALT 28  --   --   AST 33  --   --   GLUCOSE 100*   < > 86   < > = values in this interval not displayed.   Lab Results  Component Value Date   CHOL 116 01/23/2020   HDL 36 (L) 01/23/2020   LDLCALC 69 01/23/2020   TRIG 56 01/23/2020   BNP (last 3 results) Recent Labs    01/23/20 0338 03/12/20 1127  BNP 1,370.5* 533.8*    ProBNP (last 3 results) No results for input(s): PROBNP in the last 8760 hours.   Diagnostic Studies/Procedures   No results found.  Discharge Medications   Allergies as of 03/18/2020      Reactions   Other Other (See Comments)   No blood products      Medication List    STOP taking these medications   amiodarone 200 MG tablet Commonly known as: PACERONE   digoxin 0.125 MG tablet Commonly known as: LANOXIN     TAKE these medications   apixaban 5 MG Tabs tablet Commonly  known as: ELIQUIS Take 1 tablet (5 mg total) by mouth 2 (two) times daily.   aspirin 81 MG chewable tablet Chew 1 tablet (81 mg total) by mouth daily.   atorvastatin 40 MG tablet Commonly known as: LIPITOR Take 1 tablet (40 mg total) by mouth daily.   doxycycline 100 MG tablet Commonly known as: VIBRA-TABS Take 1 tablet (100 mg total) by mouth every 12 (twelve) hours.   furosemide 40 MG tablet Commonly known as: LASIX Take 1 tablet (40 mg total) by mouth daily. What changed:   medication strength  how much to take   hydrocerin Crea Apply 1 application topically 2 (two) times daily.   losartan 25 MG tablet Commonly known as: COZAAR Take 1 tablet (25 mg total) by mouth daily. What changed: how much to take   spironolactone 25 MG tablet Commonly known as: ALDACTONE Take 0.5 tablets (12.5 mg total) by mouth daily. What changed:   how much to take  additional instructions   traMADol 50 MG tablet Commonly known  as: ULTRAM Take 1 tablet (50 mg total) by mouth every 6 (six) hours as needed for moderate pain.       Disposition   The patient will be discharged in stable condition to home.   Follow-up Information    Marcine Matar, MD Follow up on 04/04/2020.   Specialty: Internal Medicine Why: @ 2:30 for hospital follow up appointment. If you cannot make scheduled appointment please call the office to reschedule.  Contact information: [address] [place] [address] [phone]        [facility] Follow up on 03/29/2020.   Specialty: Cardiology Why: at 1:30 Garage Code [id number]  Contact information: [address] [id number] [place] [address] [phone]       Regan Lemming, MD Follow up on 04/25/2020.   Specialty: Cardiology Why: at 330 pm for 1 month post ablation follow up Contact information: [address] [place] [address] [phone]                 Duration of Discharge Encounter: Greater than 35 minutes   Signed, Knute Neu  03/18/2020, 5:50 PM

## 2020-03-19 ENCOUNTER — Other Ambulatory Visit (HOSPITAL_COMMUNITY): Payer: Self-pay

## 2020-03-19 NOTE — Progress Notes (Signed)
Paramedicine Encounter    Patient ID: Corey Brady, male    DOB: [birth year], 64 y.o.   MRN: [id number]   Patient Care Team: Patient, No Pcp Per as PCP - General (General Practice) Bensimhon, Bevelyn Buckles, MD as PCP - Advanced Heart Failure (Cardiology)  Patient Active Problem List   Diagnosis Date Noted  . Acute on chronic systolic (congestive) heart failure (HCC) 03/12/2020  . S/P CABG (coronary artery bypass graft) 01/30/2020  . Acute heart failure (HCC) 01/23/2020    Current Outpatient Medications:  .  apixaban (ELIQUIS) 5 MG TABS tablet, Take 1 tablet (5 mg total) by mouth 2 (two) times daily., Disp: 180 tablet, Rfl: 3 .  aspirin 81 MG chewable tablet, Chew 1 tablet (81 mg total) by mouth daily., Disp:  , Rfl:  .  atorvastatin (LIPITOR) 40 MG tablet, Take 1 tablet (40 mg total) by mouth daily., Disp: 90 tablet, Rfl: 3 .  doxycycline (VIBRA-TABS) 100 MG tablet, Take 1 tablet (100 mg total) by mouth every 12 (twelve) hours., Disp: 10 tablet, Rfl: 0 .  furosemide (LASIX) 40 MG tablet, Take 1 tablet (40 mg total) by mouth daily., Disp: 30 tablet, Rfl: 5 .  hydrocerin (EUCERIN) CREA, Apply 1 application topically 2 (two) times daily., Disp: 228 g, Rfl: 0 .  losartan (COZAAR) 25 MG tablet, Take 1 tablet (25 mg total) by mouth daily., Disp: 30 tablet, Rfl: 5 .  spironolactone (ALDACTONE) 25 MG tablet, Take 0.5 tablets (12.5 mg total) by mouth daily., Disp: 25 tablet, Rfl: 3 .  traMADol (ULTRAM) 50 MG tablet, Take 1 tablet (50 mg total) by mouth every 6 (six) hours as needed for moderate pain., Disp: 28 tablet, Rfl: 0 Allergies  Allergen Reactions  . Other Other (See Comments)    No blood products      Social History   Socioeconomic History  . Marital status: Single    Spouse name: Not on file  . Number of children: Not on file  . Years of education: Not on file  . Highest education level: Not on file  Occupational History  . Not on file  Tobacco Use  . Smoking status: Never Smoker   . Smokeless tobacco: Never Used  Substance and Sexual Activity  . Alcohol use: Yes  . Drug use: Not on file  . Sexual activity: Not on file  Other Topics Concern  . Not on file  Social History Narrative  . Not on file   Social Determinants of Health   Financial Resource Strain: Low Risk   . Difficulty of Paying Living Expenses: Not very hard  Food Insecurity: No Food Insecurity  . Worried About Programme researcher, broadcasting/film/video in the Last Year: Never true  . Ran Out of Food in the Last Year: Never true  Transportation Needs: No Transportation Needs  . Lack of Transportation (Medical): No  . Lack of Transportation (Non-Medical): No  Physical Activity:   . Days of Exercise per Week:   . Minutes of Exercise per Session:   Stress:   . Feeling of Stress :   Social Connections:   . Frequency of Communication with Friends and Family:   . Frequency of Social Gatherings with Friends and Family:   . Attends Religious Services:   . Active Member of Clubs or Organizations:   . Attends Banker Meetings:   Marland Kitchen Marital Status:   Intimate Partner Violence:   . Fear of Current or Ex-Partner:   . Emotionally Abused:   .  Physically Abused:   . Sexually Abused:     Physical Exam Cardiovascular:     Rate and Rhythm: Normal rate and regular rhythm.     Pulses: Normal pulses.  Pulmonary:     Effort: Pulmonary effort is normal.     Breath sounds: Normal breath sounds.  Musculoskeletal:        General: Normal range of motion.     Right lower leg: Edema present.     Left lower leg: Edema present.  Skin:    General: Skin is warm and dry.     Capillary Refill: Capillary refill takes less than 2 seconds.  Neurological:     Mental Status: He is alert and oriented to person, place, and time.  Psychiatric:        Mood and Affect: Mood normal.         Future Appointments  Date Time Provider [place]  03/21/2020  9:45 AM Lajuana Matte, MD [facility]  03/29/2020  11:10 AM Antony Blackbird, MD [facility] None  03/29/2020  1:30 PM [facility] PA/NP [facility] None  04/04/2020  2:30 PM Ladell Pier, MD [facility] None  04/25/2020  3:30 PM Camnitz, Ocie Doyne, MD [facility]    BP 132/74 (BP Location: Left Arm, Patient Position: Sitting, Cuff Size: Normal)   Pulse 78   Resp 16   Wt 216 lb 11.2 oz (98.3 kg)   SpO2 98%   BMI 29.39 kg/m   Weight yesterday- 215 lb Last visit weight- n/a  Corey Brady was seen at home today and reported feeling "much better" now that he has gotten home. He denied chest pain, SOB, headache, dizziness, orthopnea, fever or cough since being discharged from the hospital. He stated he has been compliant with his new medication regimen and his weight is stable. His medications were verified and he has already refilled his pillbox. I checked the box for accuracy and found a couple of small errors. I pointed these out so he could try avoiding them moving forward and he was understanding. I will follow up next week.   Jacquiline Doe, EMT 03/19/20  ACTION: Home visit completed Next visit planned for 1 week

## 2020-03-20 ENCOUNTER — Other Ambulatory Visit: Payer: Self-pay | Admitting: Thoracic Surgery (Cardiothoracic Vascular Surgery)

## 2020-03-20 DIAGNOSIS — Z951 Presence of aortocoronary bypass graft: Secondary | ICD-10-CM

## 2020-03-21 ENCOUNTER — Encounter: Payer: Self-pay | Admitting: Thoracic Surgery (Cardiothoracic Vascular Surgery)

## 2020-03-21 ENCOUNTER — Other Ambulatory Visit: Payer: Self-pay

## 2020-03-21 ENCOUNTER — Ambulatory Visit (INDEPENDENT_AMBULATORY_CARE_PROVIDER_SITE_OTHER): Payer: Self-pay | Admitting: Thoracic Surgery (Cardiothoracic Vascular Surgery)

## 2020-03-21 ENCOUNTER — Ambulatory Visit
Admission: RE | Admit: 2020-03-21 | Discharge: 2020-03-21 | Disposition: A | Discharge status: Home / Self Care | Payer: Self-pay | Source: Ambulatory Visit | Attending: Thoracic Surgery (Cardiothoracic Vascular Surgery) | Admitting: Thoracic Surgery (Cardiothoracic Vascular Surgery)

## 2020-03-21 VITALS — BP 110/68 | HR 70 | Temp 96.6°F | Resp 16 | Ht 72.0 in | Wt 216.0 lb

## 2020-03-21 DIAGNOSIS — Z8679 Personal history of other diseases of the circulatory system: Secondary | ICD-10-CM

## 2020-03-21 DIAGNOSIS — I5023 Acute on chronic systolic (congestive) heart failure: Secondary | ICD-10-CM

## 2020-03-21 DIAGNOSIS — Z951 Presence of aortocoronary bypass graft: Secondary | ICD-10-CM

## 2020-03-21 NOTE — Progress Notes (Signed)
      [address]       [place] [address]             [phone]        Ismael Karge [facility] Medical Record #[id number] Date of Birth: 1956/09/15  Referring: Dolores Patty, MD Primary Care: Patient, No Pcp Per Primary Cardiologist:No primary care provider on file.  Reason for visit:   follow-up  History of Present Illness:     Mr. Prosser comes in for his first follow-up appointment.  It has been 6 weeks since his CABG, but he has done very well.  He is ambulatory and has noted improvement in his exercise tolerance.  Continues to have some swelling in his legs but this is massively improved from his preoperative exam.  Physical Exam: BP 110/68 (BP Location: Right Arm, Patient Position: Sitting, Cuff Size: Normal)   Pulse 70   Temp (!) 96.6 F (35.9 C)   Resp 16   Ht 6' (1.829 m)   Wt 216 lb (98 kg)   SpO2 98% Comment: RA  BMI 29.29 kg/m   Alert NAD Incision clean.  Sternum stable Abdomen soft, ND 2+ peripheral edema   Diagnostic Studies & Laboratory data: CXR: Clear     Assessment / Plan:   64 year old male congestive heart failure status post CABG x1.  Doing well Cleared for cardiac rehab Follow-up as needed   Corliss Skains 03/21/2020 10:26 AM

## 2020-03-22 ENCOUNTER — Ambulatory Visit: Payer: Self-pay | Admitting: Thoracic Surgery (Cardiothoracic Vascular Surgery)

## 2020-03-26 ENCOUNTER — Other Ambulatory Visit (HOSPITAL_COMMUNITY): Payer: Self-pay

## 2020-03-26 NOTE — Progress Notes (Signed)
Paramedicine Encounter    Patient ID: Corey Brady, male    DOB: Mar 30, 1956, 64 y.o.   MRN: [id number]   Patient Care Team: Patient, No Pcp Per as PCP - General (General Practice) Bensimhon, Bevelyn Buckles, MD as PCP - Advanced Heart Failure (Cardiology)  Patient Active Problem List   Diagnosis Date Noted   Acute on chronic systolic (congestive) heart failure (HCC) 03/12/2020   S/P CABG (coronary artery bypass graft) 01/30/2020   Acute heart failure (HCC) 01/23/2020    Current Outpatient Medications:    apixaban (ELIQUIS) 5 MG TABS tablet, Take 1 tablet (5 mg total) by mouth 2 (two) times daily., Disp: 180 tablet, Rfl: 3   aspirin 81 MG chewable tablet, Chew 1 tablet (81 mg total) by mouth daily., Disp:  , Rfl:    atorvastatin (LIPITOR) 40 MG tablet, Take 1 tablet (40 mg total) by mouth daily., Disp: 90 tablet, Rfl: 3   doxycycline (VIBRA-TABS) 100 MG tablet, Take 1 tablet (100 mg total) by mouth every 12 (twelve) hours., Disp: 10 tablet, Rfl: 0   furosemide (LASIX) 40 MG tablet, Take 1 tablet (40 mg total) by mouth daily., Disp: 30 tablet, Rfl: 5   hydrocerin (EUCERIN) CREA, Apply 1 application topically 2 (two) times daily., Disp: 228 g, Rfl: 0   losartan (COZAAR) 25 MG tablet, Take 1 tablet (25 mg total) by mouth daily., Disp: 30 tablet, Rfl: 5   spironolactone (ALDACTONE) 25 MG tablet, Take 0.5 tablets (12.5 mg total) by mouth daily., Disp: 25 tablet, Rfl: 3 Allergies  Allergen Reactions   Other Other (See Comments)    No blood products      Social History   Socioeconomic History   Marital status: Single    Spouse name: Not on file   Number of children: Not on file   Years of education: Not on file   Highest education level: Not on file  Occupational History   Not on file  Tobacco Use   Smoking status: Never Smoker   Smokeless tobacco: Never Used  Substance and Sexual Activity   Alcohol use: Yes   Drug use: Not on file   Sexual activity: Not on file   Other Topics Concern   Not on file  Social History Narrative   Not on file   Social Determinants of Health   Financial Resource Strain: Low Risk    Difficulty of Paying Living Expenses: Not very hard  Food Insecurity: No Food Insecurity   Worried About Running Out of Food in the Last Year: Never true   Ran Out of Food in the Last Year: Never true  Transportation Needs: No Transportation Needs   Lack of Transportation (Medical): No   Lack of Transportation (Non-Medical): No  Physical Activity:    Days of Exercise per Week:    Minutes of Exercise per Session:   Stress:    Feeling of Stress :   Social Connections:    Frequency of Communication with Friends and Family:    Frequency of Social Gatherings with Friends and Family:    Attends Religious Services:    Active Member of Clubs or Organizations:    Attends Banker Meetings:    Marital Status:   Intimate Partner Violence:    Fear of Current or Ex-Partner:    Emotionally Abused:    Physically Abused:    Sexually Abused:     Physical Exam Cardiovascular:     Rate and Rhythm: Normal rate and regular rhythm.  Pulses: Normal pulses.  Pulmonary:     Effort: Pulmonary effort is normal.     Breath sounds: Normal breath sounds.  Musculoskeletal:        General: Normal range of motion.     Right lower leg: No edema.     Left lower leg: No edema.  Skin:    General: Skin is warm and dry.     Capillary Refill: Capillary refill takes less than 2 seconds.  Neurological:     Mental Status: He is alert and oriented to person, place, and time.  Psychiatric:        Mood and Affect: Mood normal.         Future Appointments  Date Time Provider Department Center  03/29/2020 11:10 AM Cain Saupe, MD [facility] None  03/29/2020  1:30 PM [facility] PA/NP [facility] None  04/04/2020  2:30 PM Marcine Matar, MD [facility] None  04/25/2020  3:30 PM Camnitz, Andree Coss, MD [facility]     BP 114/71 (BP Location: Right Arm, Patient Position: Sitting, Cuff Size: Normal)    Pulse 82    Resp 16    Wt 218 lb 8 oz (99.1 kg)    SpO2 97%    BMI 29.63 kg/m   Weight yesterday- 218 lb Last visit weight- 216 lb  Mr [name] was seen at home today and reported feeling well. He denied chest pain, SOB, headache, dizziness, orthopnea, fever or cough over the past week. He stated he has been compliant with his medications over the past week and his weight has been stable. His medications were verified and his pillbox was refilled. Upon assessment I noted a rash on his left lower leg. I sent an image to Corey Becket, NP, as he was recently hospitalized with cellulitis. She advised no action should be taken right now but to keep a close eye on it. I will follow up next week.   Corey Brady, EMT 03/26/20  ACTION: Home visit completed Next visit planned for 1 week

## 2020-03-29 ENCOUNTER — Telehealth (HOSPITAL_COMMUNITY): Payer: Self-pay | Admitting: Pharmacy Technician

## 2020-03-29 ENCOUNTER — Other Ambulatory Visit (HOSPITAL_COMMUNITY): Payer: Self-pay

## 2020-03-29 ENCOUNTER — Ambulatory Visit (HOSPITAL_COMMUNITY)
Admit: 2020-03-29 | Discharge: 2020-03-29 | Disposition: A | Discharge status: Home / Self Care | Payer: Medicaid Other | Attending: Internal Medicine | Admitting: Internal Medicine

## 2020-03-29 ENCOUNTER — Other Ambulatory Visit: Payer: Self-pay

## 2020-03-29 ENCOUNTER — Ambulatory Visit: Payer: Medicaid Other | Attending: Family Medicine | Admitting: Family

## 2020-03-29 ENCOUNTER — Encounter (HOSPITAL_COMMUNITY): Payer: Self-pay

## 2020-03-29 ENCOUNTER — Ambulatory Visit: Payer: Self-pay | Admitting: Family Medicine

## 2020-03-29 VITALS — BP 100/64 | HR 75 | Ht 72.0 in | Wt 221.0 lb

## 2020-03-29 VITALS — BP 112/76 | HR 90 | Wt 219.8 lb

## 2020-03-29 DIAGNOSIS — Z951 Presence of aortocoronary bypass graft: Secondary | ICD-10-CM | POA: Diagnosis not present

## 2020-03-29 DIAGNOSIS — I5022 Chronic systolic (congestive) heart failure: Secondary | ICD-10-CM | POA: Diagnosis present

## 2020-03-29 DIAGNOSIS — Z7982 Long term (current) use of aspirin: Secondary | ICD-10-CM | POA: Insufficient documentation

## 2020-03-29 DIAGNOSIS — Z7901 Long term (current) use of anticoagulants: Secondary | ICD-10-CM | POA: Diagnosis not present

## 2020-03-29 DIAGNOSIS — D649 Anemia, unspecified: Secondary | ICD-10-CM | POA: Diagnosis not present

## 2020-03-29 DIAGNOSIS — Z79899 Other long term (current) drug therapy: Secondary | ICD-10-CM | POA: Diagnosis not present

## 2020-03-29 DIAGNOSIS — I2510 Atherosclerotic heart disease of native coronary artery without angina pectoris: Secondary | ICD-10-CM | POA: Insufficient documentation

## 2020-03-29 DIAGNOSIS — Z8679 Personal history of other diseases of the circulatory system: Secondary | ICD-10-CM | POA: Diagnosis not present

## 2020-03-29 DIAGNOSIS — L03115 Cellulitis of right lower limb: Secondary | ICD-10-CM

## 2020-03-29 DIAGNOSIS — I5032 Chronic diastolic (congestive) heart failure: Secondary | ICD-10-CM

## 2020-03-29 DIAGNOSIS — L03116 Cellulitis of left lower limb: Secondary | ICD-10-CM

## 2020-03-29 LAB — CBC
HCT: 40.4 % (ref 39.0–52.0)
Hemoglobin: 13.1 g/dL (ref 13.0–17.0)
MCH: 29.2 pg (ref 26.0–34.0)
MCHC: 32.4 g/dL (ref 30.0–36.0)
MCV: 90.2 fL (ref 80.0–100.0)
Platelets: 215 10*3/uL (ref 150–400)
RBC: 4.48 MIL/uL (ref 4.22–5.81)
RDW: 14 % (ref 11.5–15.5)
WBC: 7.1 10*3/uL (ref 4.0–10.5)
nRBC: 0 % (ref 0.0–0.2)

## 2020-03-29 LAB — BASIC METABOLIC PANEL
Anion gap: 8 (ref 5–15)
BUN: 16 mg/dL (ref 8–23)
CO2: 26 mmol/L (ref 22–32)
Calcium: 9.1 mg/dL (ref 8.9–10.3)
Chloride: 105 mmol/L (ref 98–111)
Creatinine, Ser: 1.03 mg/dL (ref 0.61–1.24)
GFR calc Af Amer: >60 mL/min (ref >60)
GFR calc non Af Amer: >60 mL/min (ref >60)
Glucose, Bld: 94 mg/dL (ref 70–99)
Potassium: 4.1 mmol/L (ref 3.5–5.1)
Sodium: 139 mmol/L (ref 135–145)

## 2020-03-29 MED ORDER — ENTRESTO 24-26 MG PO TABS: 1.0000 | ORAL_TABLET | Freq: Two times a day (BID) | ORAL | 11 refills | Status: DC

## 2020-03-29 MED ORDER — CEPHALEXIN 500 MG PO CAPS: 500.0000 mg | ORAL_CAPSULE | Freq: Three times a day (TID) | ORAL | 0 refills | Status: AC

## 2020-03-29 MED ORDER — CEPHALEXIN 500 MG PO CAPS: 500.0000 mg | ORAL_CAPSULE | Freq: Three times a day (TID) | ORAL | 0 refills | Status: DC

## 2020-03-29 MED FILL — CEPHALEXIN 500 MG CAPSULE: 500 | 7 days supply | Qty: 21 | Fill #0

## 2020-03-29 NOTE — Progress Notes (Signed)
Patient is concerned about the cellulitis in his legs.

## 2020-03-29 NOTE — Progress Notes (Signed)
Advanced Heart Failure Clinic Note   Referring Physician: PCP: Patient, No Pcp Per PCP-Cardiologist:  [name]   HPI:  64 y/o male w/ CAD, severe systolic HF and PAFL. Recently underwent CABG 5/21. Post-op course c/b low output and PAFL requiring several DC-CV. Was placed on amiodarone and dose reduced due to bradycardia.   Readmitted back to hospital from [facility] on 6/15 for a/c CHF w/ volume overload, recurrent ALF and bilateral  LE cellulitis.    He was loaded on IV amiodarone, started on IV lasix for CHF and started on IV vanc for cellulitis. No signs of systemic infection. WBC remained normal and he remained AF. Abx later changed to PO doxycycline. Had good diuresis w/ IV Lasix and changed back to PO diuretics. EP was consulted for AFL and he underwent  AFL ablation6/19. Post ablation had periods of junctional rhythm. Dig and amio held and junctional rhythm resolved. EP recommended avoidance of rate control medications. No indication for PPM at this time. Was discharged home on 6/21. D/c wt was 215 lb. He is being followed by paramedicine.   He returns to clinic today for f/u. Doing well. In NSR by EKG. Denies breakthrough palpitations. No chest pain or dyspnea. NYHA Class II. Wt up 4 lb since discharge. He has trace bilateral LEE on exam w/ bilateral erythema that blanches w/ leg elevation. BP and HR stable.      Review of Systems: [y] = yes, [ ]  = no   General: Weight gain [Y ]; Weight loss [ ] ; Anorexia [ ] ; Fatigue [ ] ; Fever [ ] ; Chills [ ] ; Weakness [ ]   Cardiac: Chest pain/pressure [ ] ; Resting SOB [ ] ; Exertional SOB [ ] ; Orthopnea [ ] ; Pedal Edema [ ] ; Palpitations [ ] ; Syncope [ ] ; Presyncope [ ] ; Paroxysmal nocturnal dyspnea[ ]   Pulmonary: Cough [ ] ; Wheezing[ ] ; Hemoptysis[ ] ; Sputum [ ] ; Snoring [ ]   GI: Vomiting[ ] ; Dysphagia[ ] ; Melena[ ] ; Hematochezia [ ] ; Heartburn[ ] ; Abdominal pain [ ] ; Constipation [ ] ; Diarrhea [ ] ; BRBPR [ ]   GU: Hematuria[ ] ; Dysuria [ ] ;  Nocturia[ ]   Vascular: Pain in legs with walking [ ] ; Pain in feet with lying flat [ ] ; Non-healing sores [ ] ; Stroke [ ] ; TIA [ ] ; Slurred speech [ ] ;  Neuro: Headaches[ ] ; Vertigo[ ] ; Seizures[ ] ; Paresthesias[ ] ;Blurred vision [ ] ; Diplopia [ ] ; Vision changes [ ]   Ortho/Skin: Arthritis [ ] ; Joint pain [ ] ; Muscle pain [ ] ; Joint swelling [ ] ; Back Pain [ ] ; Rash [ ]   Psych: Depression[ ] ; Anxiety[ ]   Heme: Bleeding problems [ ] ; Clotting disorders [ ] ; Anemia [ ]   Endocrine: Diabetes [ ] ; Thyroid dysfunction[ ]    Past Medical History:  Diagnosis Date  . CHF (congestive heart failure) (HCC)   . Gout   . No blood products 01/27/2020    Current Outpatient Medications  Medication Sig Dispense Refill  . apixaban (ELIQUIS) 5 MG TABS tablet Take 1 tablet (5 mg total) by mouth 2 (two) times daily. 180 tablet 3  . aspirin 81 MG chewable tablet Chew 1 tablet (81 mg total) by mouth daily.    atorvastatin (LIPITOR) 40 MG tablet Take 1 tablet (40 mg total) by mouth daily. 90 tablet 3  . cephALEXin (KEFLEX) 500 MG capsule Take 1 capsule (500 mg total) by mouth 3 (three) times daily for 7 days. 21 capsule 0  . furosemide (LASIX) 40 MG tablet Take 1 tablet (40 mg  total) by mouth daily. 30 tablet 5  . hydrocerin (EUCERIN) CREA Apply 1 application topically 2 (two) times daily. 228 g 0  . losartan (COZAAR) 25 MG tablet Take 1 tablet (25 mg total) by mouth daily. 30 tablet 5  . spironolactone (ALDACTONE) 25 MG tablet Take 0.5 tablets (12.5 mg total) by mouth daily. 25 tablet 3  . doxycycline (VIBRA-TABS) 100 MG tablet Take 1 tablet (100 mg total) by mouth every 12 (twelve) hours. (Patient not taking: Reported on 03/26/2020) 10 tablet 0   No current facility-administered medications for this encounter.    Allergies  Allergen Reactions  . Other Other (See Comments)    No blood products      Social History   Socioeconomic History  . Marital status: Single    Spouse name: Not on file  .  Number of children: Not on file  . Years of education: Not on file  . Highest education level: Not on file  Occupational History  . Not on file  Tobacco Use  . Smoking status: Never Smoker  . Smokeless tobacco: Never Used  Substance and Sexual Activity  . Alcohol use: Yes  . Drug use: Not on file  . Sexual activity: Not on file  Other Topics Concern  . Not on file  Social History Narrative  . Not on file   Social Determinants of Health   Financial Resource Strain: Low Risk   . Difficulty of Paying Living Expenses: Not very hard  Food Insecurity: No Food Insecurity  . Worried About Programme researcher, broadcasting/film/video in the Last Year: Never true  . Ran Out of Food in the Last Year: Never true  Transportation Needs: No Transportation Needs  . Lack of Transportation (Medical): No  . Lack of Transportation (Non-Medical): No  Physical Activity:   . Days of Exercise per Week:   . Minutes of Exercise per Session:   Stress:   . Feeling of Stress :   Social Connections:   . Frequency of Communication with Friends and Family:   . Frequency of Social Gatherings with Friends and Family:   . Attends Religious Services:   . Active Member of Clubs or Organizations:   . Attends Banker Meetings:   Marland Kitchen Marital Status:   Intimate Partner Violence:   . Fear of Current or Ex-Partner:   . Emotionally Abused:   Marland Kitchen Physically Abused:   . Sexually Abused:       Family History  Problem Relation Age of Onset  . Gout Sister     Vitals:   03/29/20 1344  BP: 112/76  Pulse: 90  SpO2: 96%  Weight: 99.7 kg (219 lb 12.8 oz)    PHYSICAL EXAM: General:  Well appearing. No respiratory difficulty HEENT: normal Neck: supple. no JVD. Carotids 2+ bilat; no bruits. No lymphadenopathy or thyromegaly appreciated. Cor: PMI nondisplaced. Regular rate & rhythm. No rubs, gallops or murmurs. Lungs: clear Abdomen: soft, nontender, nondistended. No hepatosplenomegaly. No bruits or masses. Good bowel  sounds. Extremities: no cyanosis, clubbing, rash, trace bilateral LE edema, bilateral LE erythema on exam that blanches w/ elevation Neuro: alert & oriented x 3, cranial nerves grossly intact. moves all 4 extremities w/o difficulty. Affect pleasant.  ECG: NSR 88 bpm    ASSESSMENT & PLAN:  1. Chronic Systolic Heart Failure - ECHO 8/50 EF 15% with severe global HK, and moderate RV dysfunction. Etiology possible ETOH versus HTN (denies severe ETOH use). TSH ok. HIV NR  - 4/28 LHC  with severe 1v CAD. RHC with preserved cardiac output.  - S/p CABG x 1 w/ LIMA-LAD 01/30/20 - Echo repeated 6/21, LVEF 25-30%, RV mildly reduced  - NYHAII.  - Volume status mildly elevated. Wt up 4 lb from previous dry wt.  - Stop Losartan - Start Entresto 24-26 mg bid - Continue spiro 25 mg daily  - no  blocker nor digoxin given recent junctional bradycardia  - consider SGLT2i next - check BMP today and again in 7 days  - f/u q 3-4 weeks for med titration. Will need repeat echo 3 months after meds are fully optimized.   2. CAD:  - LHC 12/2019 with severe 1VCAD - S/p CABG x 1 w/ LIMA-LAD 01/30/20 - No s/s angina - on ASA 81 + statin, atorvastatin 40 mg - no  blocker w/ h/o bradycardia    5. H/O Arial Flutter  - s/p ablation 6/18 by [name]. Periods of junctional rhythm post ablation - off amio and dig -> improved - Discussed w/ EP. No role for PPM/ICD - NSR on EKG today. HR stable  - Continue eliquis 5 mg twice a day. Check CBC today  - has f/u w/ [name] end of the month. If no recurrent Aflutter can possible d/c Eliquis in the near future.   6. LE Erythema  - previously felt to have bilateral LE cellulitis and has received several rounds of abx - he has recurrent bilateral LE erythema on exam that blanches w/ elevation, c/w venous insuffiencey (trace bilateral LEE on exam). No increased warmth. Doubt cellulitis.  - continue diuretics per above - encouraged regular use of compression  stockings and elevation when able.   F/u w/ pharmD in 3-4 weeks for further med titration     Robbie Lis, PA-C 03/29/20

## 2020-03-29 NOTE — Progress Notes (Signed)
Paramedicine Encounter   Patient ID: Corey Brady , male,   DOB: 05-27-1956,63 y.o.,  MRN: [id number]  Mr Gregori was seen at the HF clinic today and reported feeling well. Redness and the rash have increased in his legs and he was given an antibiotic at [facility] today. Per Robbie Lis, PA-C, we are changing him to [facility] 24/26 mg and discontinuing Losartan. F/u with pharmacy in 3-4 weeks.   Jacqualine Code, EMT 03/29/2020   ACTION: Next visit planned for Tuesday

## 2020-03-29 NOTE — Progress Notes (Addendum)
Patient ID: Corey Brady, male    DOB: 1956/02/27  MRN: [id number]  CC: Heart Failure Follow-Up  Subjective: Corey Brady is a 64 y.o. male with history of acute heart failure, acute on chronic systolic congestive heart failure, and s/p CABG (coronary artery bypass graft) who presents for heart failure follow-up.  1. CORONARY ARTERY DISEASE FOLLOW-UP: Chest pain: no Edema: yes, bilateral lower extremities, reports movement aggravates this, and resting at night relieves this Orthopnea: no, using 2 pillows  Paroxysmal nocturnal dyspnea: no Dyspnea on exertion: no Pneumovax:  Not up to Date, declines Aspirin: yes   Last visit 02/16/2020 with Dr. Jillyn Hidden. During that encounter patient with recent hospitalization in late April 2021.  Patient began to develop increased swelling in lower legs, increased shortness of breath, and becoming more fatigued.  Patient was admitted to hospital and started on treatment for acute heart failure with ejection fraction of 20% or less on echo.  Patient also had some complaints of intermittent chest pressure prior to his hospitalization and heart catheterization done which showed single-vessel coronary artery disease of the LAD for which patient had surgical intervention.  He developed postsurgical atrial fibrillation and flutter for which he was placed on IV Amiodarone due to increase heart rate and later cardioverted.  After discharge patient was currently on Amiodarone and Digoxin.  CMP obtained in follow-up of these medications for treatment of CHF, hyperlipidemia, and CAD as well as CBC and follow-up of mild anemia and history of antithrombotic medication.  Patient had upcoming appointment with cardiology.  Last visit 03/12/2020 with Cardiology nurse practitioner Clegg. During that encounter no chest pain. Continued on Aspirin 81 and Atorvastatin 40 mg.  2. HEART FAILURE FOLLOW-UP: Currently taking:  See med list Taking ACEI/ARB?  yes, Losartan  Taking beta  blocker?  no Med Adherence: reports taking medication daily without missing doses Medication side effects:  none Adherence with salt restriction:  yes Shortness of breath?  no Leg swelling?  yes, bilateral lower extremities, reports movement aggravates this, and resting at night relieves this Last echocardiogram:  03/12/2020 Last ejection fraction:  20% or less Followed by Cardiology?  yes Date last seen by Cardiology:  03/12/2020  Last visit 03/13/2019 with cardiology nurse practitioner Clegg.  During that encounter echo EF 15% with severe global HK, and moderate RV dysfunction.  Etiology possible EtOH versus HTN (denied severe EtOH use).  TSH was okay.  Continued on Spiro 12.5 and Losartan 12.5.  Continued on digoxin 0.125 mg.  Concern for tachy/bradycardia syndrome therefore beta-blocker on hold.  3. ATRIAL FLUTTER FOLLOW-UP: Satisfied with current treatment: yes  Medication side effects:  no Medication compliance: good compliance Palpitations:  no Chest pain:  no Dyspnea on exertion:  no Orthopnea:  no Syncope:  no Edema:  yes, bilateral lower extremities, reports movement aggravates this, and resting at night relieves this Anti-coagulation: Eliquis  Last visit 02/16/2020 with Dr. Jillyn Hidden. During that encounter patient with atrial flutter and atrial fibrillation during hospitalization after coronary artery bypass graft surgery.  He was started on amiodarone IV continue to have elevated heart rate.  On 02/06/2020 patient went cardioversion and patient was able to switch to p.o. amiodarone while remaining in normal sinus rhythm with PACs.  Prescription was provided for Eliquis so that this can be offered through patient assistance program.  Patient was also to discuss with his heart specialist how long he will remain on Eliquis as it was mentioned in his discharge summary that since patient is a  Jeh[employer] Witness that Eliquis may be discontinued to help reduce the risk of bleeding if patient has  remained in sinus rhythm for at least 4 weeks status post cardioversion.  Last visit 03/12/2020 with Cardiology nurse practitioner Clegg. During that encounter Amio increased to 20 mg twice a day and continued on Eliquis 5 mg twice a day.  Today patient reports he went to a Cardiology appointment a few weeks ago. Patient also reports he is no longer having appointments with Dr. Cliffton Asters unless needed.  4. ANEMIA FOLLOW-UP: Last visit 02/16/2020 with Dr. Jillyn Hidden. During that encounter patient with mild anemia during hospitalization and on antithrombotic/antiplatelet medication. CBC obtained in follow-up. Patient denied unusual bruising and bleeding. Eliquis refilled. Patient instructed to follow-up with his Cardiologist regarding how long he will remain on medication as patient is a Jehovah's Witness and that Eliquis may be discontinued in approximately 4 weeks if patient is able to maintain sinus rhythm.  Today patient reports he is still taking Eliquis.   5. CELLULITIS FOLLOW-UP: Last visit 02/16/2020 with Dr. Jillyn Hidden. During that encounter patient with increased lower extremity edema bilaterally along with increased redness and increased warmth. Prescription provided for Keflex and patient made aware to seek attention in the emergency department if acute worsening of redness, swelling, or pain should occur.  Last visit 03/12/2020 with Cardiology nurse practitioner Clegg.  During that encounter patient with bilateral lower extremity cellulitis and placed on Vancomycin.  Today patient reports he is still having some redness and swelling bilaterally. Requesting additional refill of Keflex.  Patient Active Problem List   Diagnosis Date Noted  . Acute on chronic systolic (congestive) heart failure (HCC) 03/12/2020  . S/P CABG (coronary artery bypass graft) 01/30/2020  . Acute heart failure (HCC) 01/23/2020     Current Outpatient Medications on File Prior to Visit  Medication Sig Dispense Refill  .  apixaban (ELIQUIS) 5 MG TABS tablet Take 1 tablet (5 mg total) by mouth 2 (two) times daily. 180 tablet 3  . aspirin 81 MG chewable tablet Chew 1 tablet (81 mg total) by mouth daily.    Marland Kitchen atorvastatin (LIPITOR) 40 MG tablet Take 1 tablet (40 mg total) by mouth daily. 90 tablet 3  . doxycycline (VIBRA-TABS) 100 MG tablet Take 1 tablet (100 mg total) by mouth every 12 (twelve) hours. (Patient not taking: Reported on 03/26/2020) 10 tablet 0  . furosemide (LASIX) 40 MG tablet Take 1 tablet (40 mg total) by mouth daily. 30 tablet 5  . hydrocerin (EUCERIN) CREA Apply 1 application topically 2 (two) times daily. 228 g 0  . losartan (COZAAR) 25 MG tablet Take 1 tablet (25 mg total) by mouth daily. 30 tablet 5  . spironolactone (ALDACTONE) 25 MG tablet Take 0.5 tablets (12.5 mg total) by mouth daily. 25 tablet 3   No current facility-administered medications on file prior to visit.    Allergies  Allergen Reactions  . Other Other (See Comments)    No blood products    Social History   Socioeconomic History  . Marital status: Single    Spouse name: Not on file  . Number of children: Not on file  . Years of education: Not on file  . Highest education level: Not on file  Occupational History  . Not on file  Tobacco Use  . Smoking status: Never Smoker  . Smokeless tobacco: Never Used  Substance and Sexual Activity  . Alcohol use: Yes  . Drug use: Not on file  . Sexual activity:  Not on file  Other Topics Concern  . Not on file  Social History Narrative  . Not on file   Social Determinants of Health   Financial Resource Strain: Low Risk   . Difficulty of Paying Living Expenses: Not very hard  Food Insecurity: No Food Insecurity  . Worried About Programme researcher, broadcasting/film/video in the Last Year: Never true  . Ran Out of Food in the Last Year: Never true  Transportation Needs: No Transportation Needs  . Lack of Transportation (Medical): No  . Lack of Transportation (Non-Medical): No  Physical  Activity:   . Days of Exercise per Week:   . Minutes of Exercise per Session:   Stress:   . Feeling of Stress :   Social Connections:   . Frequency of Communication with Friends and Family:   . Frequency of Social Gatherings with Friends and Family:   . Attends Religious Services:   . Active Member of Clubs or Organizations:   . Attends Banker Meetings:   Marland Kitchen Marital Status:   Intimate Partner Violence:   . Fear of Current or Ex-Partner:   . Emotionally Abused:   Marland Kitchen Physically Abused:   . Sexually Abused:     Family History  Problem Relation Age of Onset  . Gout Sister     Past Surgical History:  Procedure Laterality Date  . A-FLUTTER ABLATION N/A 03/15/2020   Procedure: A-FLUTTER ABLATION;  Surgeon: Regan Lemming, MD;  Location: [facility] INVASIVE CV LAB;  Service: Cardiovascular;  Laterality: N/A;  . CARDIOVERSION N/A 02/06/2020   Procedure: CARDIOVERSION;  Surgeon: Dolores Patty, MD;  Location[facility] ENDOSCOPY;  Service: Cardiovascular;  Laterality: N/A;  . CORONARY ARTERY BYPASS GRAFT N/A 01/30/2020   Procedure: OFF PUMP CORONARY ARTERY BYPASS GRAFTING (CABG) TIMES ONE;  Surgeon: Corliss Skains, MD;  Location: [facility] OR;  Service: Open Heart Surgery;  Laterality: N/A;  swan only  . RIGHT/LEFT HEART CATH AND CORONARY ANGIOGRAPHY N/A 01/26/2020   Procedure: RIGHT/LEFT HEART CATH AND CORONARY ANGIOGRAPHY;  Surgeon: Dolores Patty, MD;  Location: [facility] INVASIVE CV LAB;  Service: Cardiovascular;  Laterality: N/A;  . TEE WITHOUT CARDIOVERSION N/A 01/30/2020   Procedure: TRANSESOPHAGEAL ECHOCARDIOGRAM (TEE);  Surgeon: Corliss Skains, MD;  Location: [facility] OR;  Service: Open Heart Surgery;  Laterality: N/A;  . TEE WITHOUT CARDIOVERSION N/A 02/06/2020   Procedure: TRANSESOPHAGEAL ECHOCARDIOGRAM (TEE);  Surgeon: Dolores Patty, MD;  Location: [facility] ENDOSCOPY;  Service: Cardiovascular;  Laterality: N/A;    ROS: Review of Systems Negative except as stated  above  PHYSICAL EXAM: Vitals with BMI 03/29/2020 03/29/2020 03/26/2020  Height - 6\' 0"  -  Weight 219 lbs 13 oz 221 lbs 218 lbs 8 oz  BMI 29.8 29.97 29.63  Systolic 112 100  Diastolic 76 64 71  Pulse 90 75 82  SpO2- 97%, room air     Physical Exam  General appearance - alert, well appearing, and in no distress and oriented to person, place, and time Mental status - alert, oriented to person, place, and time, normal mood, behavior, speech, dress, motor activity, and thought processes Neck - supple, no significant adenopathy Lymphatics - no palpable lymphadenopathy, no hepatosplenomegaly  Chest - clear to auscultation, no wheezes, rales or rhonchi, symmetric air entry, no tachypnea, retractions or cyanosis Heart - irregularly irregular rhythm with rate 75, no murmurs, rubs, clicks or gallops,   Neurological - alert, oriented, normal speech, no focal findings or movement disorder noted, neck supple without  rigidity, cranial nerves II through XII intact, DTR's normal and symmetric, motor and sensory grossly normal bilaterally, normal muscle tone, no tremors, strength 5/5, Romberg sign negative, normal gait and station Musculoskeletal - right lower extremity edema 1+, left lower extremity edema 1+ Extremities - peripheral pulses normal, no clubbing or cyanosis Skin - bilateral lower extremity with chronic venous stasis skin changes, bilateral lower extremity increased warmth, no tenderness with calf squeeze  ASSESSMENT AND PLAN: 1. Coronary artery disease involving native coronary artery of native heart without angina pectoris: -Today patient presents without acute distress. Denies chest pain, chest pressure, palpitations, and shortness of breath. -Last visit 03/12/2020 with Cardiology. During that encounter no chest pain. Continued on Aspirin 81 and Atorvastatin 40 mg. -Continue Atorvastatin and Aspirin as prescribed. Refills available on file.  -Practice heart-healthy and low-fat diet.     -BMP last obtained 03/29/2020. -CBC last obtained 03/29/2020. -Appointment at [facility] scheduled on 04/04/2020. -Keep follow-up appointments with Cardiology. Appointment with [facility] scheduled on 04/25/2020. -Follow-up with primary physician in 3 months or sooner if needed.   2. Chronic diastolic heart failure [facility]): -Today patient presents without acute distress. Denies chest pain, chest pressure, palpitations, and shortness of breath. -Last visit 03/13/2019 with Cardiology. During that encounter echo EF 15% with severe global HK, and moderate RV dysfunction.  Etiology possible EtOH versus HTN (denied severe EtOH use).  TSH was okay. Continued on Spiro 12.5 and Losartan 12.5. Continued on digoxin 0.125 mg.  Concern for tachy/bradycardia syndrome therefore beta-blocker on hold. -Continue Losartan,Spironolactone, and Digoxin as prescribed. Refills available on file.  -Practice heart-healthy and low-fat diet.    -Keep follow-up appointments with Cardiology. -BMP last obtained 03/29/2020. -CBC last obtained 03/29/2020. -Follow-up with primary physician in 3 months or sooner if needed.   3. History of atrial flutter: -Today patient presents without acute distress. Denies chest pain, chest pressure, palpitations, and shortness of breath. -Last visit 03/12/2020 with Cardiology. During that encounter Amio increased to 20 mg twice a day and continued on Eliquis 5 mg twice a day. -Continue Amio and Eliquis as prescribed. Refills available on file. -Practice heart-healthy and low-fat diet.  -Keep follow-up appointments with Cardiology. -BMP last obtained 03/29/2020. -CBC last obtained 03/29/2020. -Appointment at [facility] scheduled on 04/04/2020. -Keep follow-up appointments with Cardiology. Appointment with [facility] scheduled on 04/25/2020. -Follow-up with primary physician in 3 months or sooner if needed.   4. Mild anemia: -CBC last obtained  03/29/2020.  5. Cellulitis of both lower extremities: -Last visit 03/12/2020 with Cardiology.  During that encounter patient with bilateral lower extremity cellulitis and placed on Vancomycin. -Patient today with erythema and warmth of bilateral lower extremities. Patient reports cellulitis has improved since last visit. Denies bilateral calf pain.  -Cardiology may consider to monitor this as well as patient has upcoming appointments there. -Cephalexin for treatment of cellulitis.  -Appointment at [facility] scheduled on 04/04/2020. -Follow-up with primary physician as needed.  - cephALEXin (KEFLEX) 500 MG capsule; Take 1 capsule (500 mg total) by mouth 3 (three) times daily for 7 days.  Dispense: 21 capsule; Refill: 0   Patient was given the opportunity to ask questions.  Patient verbalized understanding of the plan and was able to repeat key elements of the plan. Patient was given clear instructions to go to Emergency Department or return to medical center if symptoms don't improve, worsen, or new problems develop.The patient verbalized understanding.  Rema Fendt, NP

## 2020-03-29 NOTE — Telephone Encounter (Signed)
Patient came in clinic, and is starting Entresto. He has no insurance at this time so we are starting an application for patient assistance.   Will follow up.

## 2020-03-29 NOTE — Patient Instructions (Signed)
Labs done today. We will contact you only if your labs are abnormal.  STOP Losartan  START Entresto 24-26mg (1 tablet) by mouth 2 times daily.   No other medication changes were made. Please continue all other medications as prescribed.  Your physician recommends that you schedule a follow-up appointment in: 7 days for a lab only appointment,2-4 weeks with our Clinical Pharmacist,Lauren and in 8 weeks for an appointment with our office.   If you have any questions or concerns before your next appointment please send [place] a message through [place] or call our office at [phone].    TO LEAVE A MESSAGE FOR THE NURSE SELECT OPTION 2, PLEASE LEAVE A MESSAGE INCLUDING: . YOUR NAME . DATE OF BIRTH . CALL BACK NUMBER . REASON FOR CALL**this is important as we prioritize the call backs  YOU WILL RECEIVE A CALL BACK THE SAME DAY AS LONG AS YOU CALL BEFORE 4:00 PM   Do the following things EVERYDAY: 1) Weigh yourself in the morning before breakfast. Write it down and keep it in a log. 2) Take your medicines as prescribed 3) Eat low salt foods--Limit salt (sodium) to 2000 mg per day.  4) Stay as active as you can everyday 5) Limit all fluids for the day to less than 2 liters   At the [facility], you and your health needs are our priority. As part of our continuing mission to provide you with exceptional heart care, we have created designated Provider Care Teams. These Care Teams include your primary Cardiologist (physician) and Advanced Practice Providers (APPs- Physician Assistants and Nurse Practitioners) who all work together to provide you with the care you need, when you need it.   You may see any of the following providers on your designated Care Team at your next follow up: Marland Kitchen Dr Arvilla Meres . Dr Marca Ancona . Tonye Becket, NP . Robbie Lis, PA . Karle Plumber, PharmD   Please be sure to bring in all your medications bottles to every appointment.

## 2020-03-29 NOTE — Patient Instructions (Addendum)
Continue Atorvastatin, Eliquis, and Losartan for CAD and heart failure. Continue Digoxin and Eliquis for atrial flutter. Keflex for bilateral lower extremity cellulitis. Keep upcoming appointment with Cardiology. Next appointment at [facility] on 04/04/2020 at 2:30 PM. Follow-up with primary physician in 4 months or sooner if needed.   Heart Failure Action Plan A heart failure action plan helps you understand what to do when you have symptoms of heart failure. Follow the plan that was created by you and your health care provider. Review your plan each time you visit your health care provider. Red zone These signs and symptoms mean you should get medical help right away:  You have trouble breathing when resting.  You have a dry cough that is getting worse.  You have swelling or pain in your legs or abdomen that is getting worse.  You suddenly gain more than 2-3 lb (0.9-1.4 kg) in a day, or more than 5 lb (2.3 kg) in one week. This amount may be more or less depending on your condition.  You have trouble staying awake or you feel confused.  You have chest pain.  You do not have an appetite.  You pass out. If you experience any of these symptoms:  Call your local emergency services (911 in the [place] right away or seek help at the emergency department of the nearest hospital. Yellow zone These signs and symptoms mean your condition may be getting worse and you should make some changes:  You have trouble breathing when you are active or you need to sleep with extra pillows.  You have swelling in your legs or abdomen.  You gain 2-3 lb (0.9-1.4 kg) in one day, or 5 lb (2.3 kg) in one week. This amount may be more or less depending on your condition.  You get tired easily.  You have trouble sleeping.  You have a dry cough. If you experience any of these symptoms:  Contact your health care provider within the next day.  Your health care provider may adjust your  medicines. Green zone These signs mean you are doing well and can continue what you are doing:  You do not have shortness of breath.  You have very little swelling or no new swelling.  Your weight is stable (no gain or loss).  You have a normal activity level.  You do not have chest pain or any other new symptoms. Follow these instructions at home:  Take over-the-counter and prescription medicines only as told by your health care provider.  Weigh yourself daily. Your target weight is __________ lb (__________ kg). ? Call your health care provider if you gain more than __________ lb (__________ kg) in a day, or more than __________ lb (__________ kg) in one week.  Eat a heart-healthy diet. Work with a diet and nutrition specialist (dietitian) to create an eating plan that is best for you.  Keep all follow-up visits as told by your health care provider. This is important. Where to find more information  American Heart Association: www.heart.org Summary  Follow the action plan that was created by you and your health care provider.  Get help right away if you have any symptoms in the Red zone. This information is not intended to replace advice given to you by your health care provider. Make sure you discuss any questions you have with your health care provider. Document Revised: 08/27/2017 Document Reviewed: 10/24/2016 Elsevier Patient Education  2020 [employer].

## 2020-04-02 ENCOUNTER — Other Ambulatory Visit (HOSPITAL_COMMUNITY): Payer: Self-pay

## 2020-04-02 NOTE — Progress Notes (Signed)
Paramedicine Encounter    Patient ID: Corey Brady, male    DOB: 16-Dec-1955, 64 y.o.   MRN: [id number]   Patient Care Team: Patient, No Pcp Per as PCP - General (General Practice) Bensimhon, Bevelyn Buckles, MD as PCP - Advanced Heart Failure (Cardiology)  Patient Active Problem List   Diagnosis Date Noted  . Acute on chronic systolic (congestive) heart failure (HCC) 03/12/2020  . S/P CABG (coronary artery bypass graft) 01/30/2020  . Acute heart failure (HCC) 01/23/2020    Current Outpatient Medications:  .  apixaban (ELIQUIS) 5 MG TABS tablet, Take 1 tablet (5 mg total) by mouth 2 (two) times daily., Disp: 180 tablet, Rfl: 3 .  aspirin 81 MG chewable tablet, Chew 1 tablet (81 mg total) by mouth daily., Disp:  , Rfl:  .  atorvastatin (LIPITOR) 40 MG tablet, Take 1 tablet (40 mg total) by mouth daily., Disp: 90 tablet, Rfl: 3 .  cephALEXin (KEFLEX) 500 MG capsule, Take 1 capsule (500 mg total) by mouth 3 (three) times daily for 7 days., Disp: 21 capsule, Rfl: 0 .  doxycycline (VIBRA-TABS) 100 MG tablet, Take 1 tablet (100 mg total) by mouth every 12 (twelve) hours. (Patient not taking: Reported on 03/26/2020), Disp: 10 tablet, Rfl: 0 .  furosemide (LASIX) 40 MG tablet, Take 1 tablet (40 mg total) by mouth daily., Disp: 30 tablet, Rfl: 5 .  hydrocerin (EUCERIN) CREA, Apply 1 application topically 2 (two) times daily., Disp: 228 g, Rfl: 0 .  sacubitril-valsartan (ENTRESTO) 24-26 MG, Take 1 tablet by mouth 2 (two) times daily., Disp: 60 tablet, Rfl: 11 .  spironolactone (ALDACTONE) 25 MG tablet, Take 0.5 tablets (12.5 mg total) by mouth daily., Disp: 25 tablet, Rfl: 3 Allergies  Allergen Reactions  . Other Other (See Comments)    No blood products      Social History   Socioeconomic History  . Marital status: Single    Spouse name: Not on file  . Number of children: Not on file  . Years of education: Not on file  . Highest education level: Not on file  Occupational History  . Not on file   Tobacco Use  . Smoking status: Never Smoker  . Smokeless tobacco: Never Used  Substance and Sexual Activity  . Alcohol use: Yes  . Drug use: Not on file  . Sexual activity: Not on file  Other Topics Concern  . Not on file  Social History Narrative  . Not on file   Social Determinants of Health   Financial Resource Strain: Low Risk   . Difficulty of Paying Living Expenses: Not very hard  Food Insecurity: No Food Insecurity  . Worried About Programme researcher, broadcasting/film/video in the Last Year: Never true  . Ran Out of Food in the Last Year: Never true  Transportation Needs: No Transportation Needs  . Lack of Transportation (Medical): No  . Lack of Transportation (Non-Medical): No  Physical Activity:   . Days of Exercise per Week:   . Minutes of Exercise per Session:   Stress:   . Feeling of Stress :   Social Connections:   . Frequency of Communication with Friends and Family:   . Frequency of Social Gatherings with Friends and Family:   . Attends Religious Services:   . Active Member of Clubs or Organizations:   . Attends Banker Meetings:   Marland Kitchen Marital Status:   Intimate Partner Violence:   . Fear of Current or Ex-Partner:   . Emotionally  Abused:   Marland Kitchen Physically Abused:   . Sexually Abused:     Physical Exam Cardiovascular:     Rate and Rhythm: Normal rate and regular rhythm.     Pulses: Normal pulses.  Pulmonary:     Effort: Pulmonary effort is normal.     Breath sounds: Normal breath sounds.  Musculoskeletal:        General: Normal range of motion.  Skin:    General: Skin is warm and dry.     Capillary Refill: Capillary refill takes less than 2 seconds.  Neurological:     Mental Status: He is alert and oriented to person, place, and time.  Psychiatric:        Mood and Affect: Mood normal.         Future Appointments  Date Time Provider Department Center  04/04/2020  2:30 PM Marcine Matar, MD [facility] None  04/05/2020 10:00 AM [facility] LAB [facility] None   04/25/2020  3:30 PM Regan Lemming, MD [facility]  04/30/2020  1:00 PM [facility] PHARMACY [facility] None  05/27/2020 12:00 PM [facility] PA/NP [facility] None    BP 118/71 (BP Location: Right Arm, Patient Position: Sitting, Cuff Size: Normal)   Pulse 87   Resp 16   Wt 217 lb 14.4 oz (98.8 kg)   SpO2 98%   BMI 29.55 kg/m   Weight yesterday- 218.1 lb Last visit weight- 219.8 lb  Corey Brady was seen at home today and reported feeling well. He denied chest pain, SOB, headache, dizziness, orthopnea, fever or cough over the past week. He stated he has been compliant with his medications over the past week and his weight has been stable. His legs are less edematous largely in part to the consistent use of compression stockings. He had already refilled his pillbox so it was checked for accuracy. I will follow up next week.   Jacqualine Code, EMT 04/02/20  ACTION: Home visit completed Next visit planned for 1 week

## 2020-04-02 NOTE — Telephone Encounter (Signed)
Sent application to [employer], via fax.  Will follow up.

## 2020-04-04 ENCOUNTER — Ambulatory Visit: Payer: Medicaid Other | Attending: Internal Medicine | Admitting: Internal Medicine

## 2020-04-04 ENCOUNTER — Encounter: Payer: Self-pay | Admitting: Internal Medicine

## 2020-04-04 ENCOUNTER — Other Ambulatory Visit: Payer: Self-pay

## 2020-04-04 VITALS — BP 115/70 | HR 81 | Temp 97.5°F | Resp 16 | Ht 72.0 in | Wt 220.6 lb

## 2020-04-04 DIAGNOSIS — Z8679 Personal history of other diseases of the circulatory system: Secondary | ICD-10-CM | POA: Diagnosis not present

## 2020-04-04 DIAGNOSIS — I5022 Chronic systolic (congestive) heart failure: Secondary | ICD-10-CM | POA: Diagnosis not present

## 2020-04-04 DIAGNOSIS — L539 Erythematous condition, unspecified: Secondary | ICD-10-CM | POA: Diagnosis not present

## 2020-04-04 MED ORDER — TRIAMCINOLONE ACETONIDE 0.1 % EX CREA: 1.0000 "application " | TOPICAL_CREAM | Freq: Two times a day (BID) | CUTANEOUS | 0 refills | Status: DC

## 2020-04-04 NOTE — Patient Instructions (Signed)
Continue to use the compression socks.  Use the triamcinolone cream at bedtime.  I have referred you to the vein and vascular clinic for further evaluation of your legs.

## 2020-04-04 NOTE — Progress Notes (Signed)
Patient ID: Corey Brady, male    DOB: [birth year]  MRN: [id number]  CC: Hospitalization Follow-up   Subjective: Corey Brady is a 64 y.o. male who presents for hosp f/u. PCP is Dr. Jillyn Hidden whom he last saw 01/2020 His concerns today include:  CAD s/p CABG 12/2019, chronic systolic CHF, HL, hx of atrial flutter, anemia  Patient was hospitalized 6/15-20 09/2019 with CHF and recurrent atrial fib/flutter and bilateral lower extremity cellulitis.  He was diuresed.  He received ablation for the A. fib/flutter.  He experienced some junctional rhythm after ablation.  EP recommended avoidance of rate control medications.  Weight on hospital discharge was 215 pounds.  He was treated with IV vancomycin that was later changed to doxycycline for cellulitis of the legs.  Since discharge, patient has seen the cardiology PA.  Overall he states he feels good.  He has not had any palpitations, dizziness or chest pains.  He has gained 5 pounds since hospital discharge.  He limits salt in the foods.  He weighs himself at home daily.  He denies any bruising or bleeding being on Eliquis.  His main concern today are his legs.  He is currently on Keflex antibiotics.  He has not had any fever.  The cardiology PA noted that he has recurrent bilateral lower extremity erythema that blanches with elevation of the legs most consistent with venous insufficiency.  She recommended wearing compression stockings. -He purchased a pair and reports that the swelling in his leg is significantly less.  However he still gets itchy blisters on the legs that sometimes become infected.  Patient Active Problem List   Diagnosis Date Noted  . Acute on chronic systolic (congestive) heart failure (HCC) 03/12/2020  . S/P CABG (coronary artery bypass graft) 01/30/2020  . Acute heart failure (HCC) 01/23/2020     Current Outpatient Medications on File Prior to Visit  Medication Sig Dispense Refill  . apixaban (ELIQUIS) 5 MG TABS tablet Take 1  tablet (5 mg total) by mouth 2 (two) times daily. 180 tablet 3  . aspirin 81 MG chewable tablet Chew 1 tablet (81 mg total) by mouth daily.    Marland Kitchen atorvastatin (LIPITOR) 40 MG tablet Take 1 tablet (40 mg total) by mouth daily. 90 tablet 3  . cephALEXin (KEFLEX) 500 MG capsule Take 1 capsule (500 mg total) by mouth 3 (three) times daily for 7 days. 21 capsule 0  . doxycycline (VIBRA-TABS) 100 MG tablet Take 1 tablet (100 mg total) by mouth every 12 (twelve) hours. (Patient not taking: Reported on 03/26/2020) 10 tablet 0  . furosemide (LASIX) 40 MG tablet Take 1 tablet (40 mg total) by mouth daily. 30 tablet 5  . hydrocerin (EUCERIN) CREA Apply 1 application topically 2 (two) times daily. 228 g 0  . sacubitril-valsartan (ENTRESTO) 24-26 MG Take 1 tablet by mouth 2 (two) times daily. 60 tablet 11  . spironolactone (ALDACTONE) 25 MG tablet Take 0.5 tablets (12.5 mg total) by mouth daily. 25 tablet 3   No current facility-administered medications on file prior to visit.    Allergies  Allergen Reactions  . Other Other (See Comments)    No blood products    Social History   Socioeconomic History  . Marital status: Single    Spouse name: Not on file  . Number of children: Not on file  . Years of education: Not on file  . Highest education level: Not on file  Occupational History  . Not on file  Tobacco  Use  . Smoking status: Never Smoker  . Smokeless tobacco: Never Used  Substance and Sexual Activity  . Alcohol use: Yes  . Drug use: Not on file  . Sexual activity: Not on file  Other Topics Concern  . Not on file  Social History Narrative  . Not on file   Social Determinants of Health   Financial Resource Strain: Low Risk   . Difficulty of Paying Living Expenses: Not very hard  Food Insecurity: No Food Insecurity  . Worried About Programme researcher, broadcasting/film/video in the Last Year: Never true  . Ran Out of Food in the Last Year: Never true  Transportation Needs: No Transportation Needs  . Lack  of Transportation (Medical): No  . Lack of Transportation (Non-Medical): No  Physical Activity:   . Days of Exercise per Week:   . Minutes of Exercise per Session:   Stress:   . Feeling of Stress :   Social Connections:   . Frequency of Communication with Friends and Family:   . Frequency of Social Gatherings with Friends and Family:   . Attends Religious Services:   . Active Member of Clubs or Organizations:   . Attends Banker Meetings:   Marland Kitchen Marital Status:   Intimate Partner Violence:   . Fear of Current or Ex-Partner:   . Emotionally Abused:   Marland Kitchen Physically Abused:   . Sexually Abused:     Family History  Problem Relation Age of Onset  . Gout Sister     Past Surgical History:  Procedure Laterality Date  . A-FLUTTER ABLATION N/A 03/15/2020   Procedure: A-FLUTTER ABLATION;  Surgeon: Regan Lemming, MD;  Location: [facility];  Service: Cardiovascular;  Laterality: N/A;  . CARDIOVERSION N/A 02/06/2020   Procedure: CARDIOVERSION;  Surgeon: Dolores Patty, MD;  Location: [facility] ENDOSCOPY;  Service: Cardiovascular;  Laterality: N/A;  . CORONARY ARTERY BYPASS GRAFT N/A 01/30/2020   Procedure: OFF PUMP CORONARY ARTERY BYPASS GRAFTING (CABG) TIMES ONE;  Surgeon: Corliss Skains, MD;  Location: [facility] OR;  Service: Open Heart Surgery;  Laterality: N/A;  swan only  . RIGHT/LEFT HEART CATH AND CORONARY ANGIOGRAPHY N/A 01/26/2020   Procedure: RIGHT/LEFT HEART CATH AND CORONARY ANGIOGRAPHY;  Surgeon: Dolores Patty, MD;  Location: [facility];  Service: Cardiovascular;  Laterality: N/A;  . TEE WITHOUT CARDIOVERSION N/A 01/30/2020   Procedure: TRANSESOPHAGEAL ECHOCARDIOGRAM (TEE);  Surgeon: Corliss Skains, MD;  Location: [facility] OR;  Service: Open Heart Surgery;  Laterality: N/A;  . TEE WITHOUT CARDIOVERSION N/A 02/06/2020   Procedure: TRANSESOPHAGEAL ECHOCARDIOGRAM (TEE);  Surgeon: Dolores Patty, MD;  Location: [facility] ENDOSCOPY;  Service: Cardiovascular;   Laterality: N/A;    ROS: Review of Systems Negative except as stated above  PHYSICAL EXAM: BP 115/70   Pulse 81   Temp (!) 97.5 F (36.4 C)   Resp 16   Ht 6' (1.829 m)   Wt 220 lb 9.6 oz (100.1 kg)   SpO2 98%   BMI 29.92 kg/m   Wt Readings from Last 3 Encounters:  04/04/20 220 lb 9.6 oz (100.1 kg)  04/02/20 217 lb 14.4 oz (98.8 kg)  03/29/20 219 lb 12.8 oz (99.7 kg)    Physical Exam  General appearance - alert, well appearing, older Caucasian male and in no distress Mental status - normal mood, behavior, speech, dress, motor activity, and thought processes Chest - clear to auscultation, no wheezes, rales or rhonchi, symmetric air entry Heart - normal rate,  regular rhythm, normal S1, S2, no murmurs, rubs, clicks or gallops Extremities -legs: Legs are hyperemic with several very small blisters.  On the lateral aspect of the right ankle he has some yellowish exudates on the skin.  He has trace edema.  Good capillary refills on the nails of the toes.  Dorsalis pedis pulses are 2-3+ bilaterally.  Upon elevation of the legs above the head, the lower legs color changes to a normal appearance  CMP Latest Ref Rng & Units 03/29/2020 03/18/2020 03/17/2020  Glucose 70 - 99 mg/dL 94 86 85  BUN 8 - 23 mg/dL 16 29(B) 28(U)  Creatinine 0.61 - 1.24 mg/dL 1.32 4.40 1.02  Sodium 135 - 145 mmol/L 139 138 135  Potassium 3.5 - 5.1 mmol/L 4.1 4.8 4.8  Chloride 98 - 111 mmol/L 105 102 100  CO2 22 - 32 mmol/L 26 28 26   Calcium 8.9 - 10.3 mg/dL 9.1 9.2 9.3  Total Protein 6.5 - 8.1 g/dL - - -  Total Bilirubin 0.3 - 1.2 mg/dL - - -  Alkaline Phos 38 - 126 U/L - - -  AST 15 - 41 U/L - - -  ALT 0 - 44 U/L - - -   Lipid Panel     Component Value Date/Time   CHOL 116 01/23/2020 1700   TRIG 56 01/23/2020 1700   HDL 36 (L) 01/23/2020 1700   CHOLHDL 3.2 01/23/2020 1700   VLDL 11 01/23/2020 1700   LDLCALC 69 01/23/2020 1700    CBC    Component Value Date/Time   WBC 7.1 03/29/2020 1419   RBC  4.48 03/29/2020 1419   HGB 13.1 03/29/2020 1419   HGB 13.1 02/16/2020 1615   HCT 40.4 03/29/2020 1419   HCT 38.7 02/16/2020 1615   PLT 215 03/29/2020 1419   PLT 349 02/16/2020 1615   MCV 90.2 03/29/2020 1419   MCV 89 02/16/2020 1615   MCH 29.2 03/29/2020 1419   MCHC 32.4 03/29/2020 1419   RDW 14.0 03/29/2020 1419   RDW 14.3 02/16/2020 1615    ASSESSMENT AND PLAN: 1. Erythema of lower extremity I think he does have venous stasis. - triamcinolone cream (KENALOG) 0.1 %; Apply 1 application topically 2 (two) times daily.  Dispense: 45 g; Refill: 0 - Ambulatory referral to Vascular Surgery  2. History of atrial flutter 3. Chronic systolic CHF (congestive heart failure) (HCC) Followed by cardiology.  Continue current medications as ordered by cardiology including Entresto, furosemide, Eliquis, aspirin, spironolactone     Patient was given the opportunity to ask questions.  Patient verbalized understanding of the plan and was able to repeat key elements of the plan.   No orders of the defined types were placed in this encounter.    Requested Prescriptions    No prescriptions requested or ordered in this encounter    No follow-ups on file.  02/18/2020, MD, FACP

## 2020-04-05 ENCOUNTER — Other Ambulatory Visit (HOSPITAL_COMMUNITY): Payer: Self-pay

## 2020-04-05 NOTE — Telephone Encounter (Signed)
Advanced Heart Failure Patient Advocate Encounter   Patient was approved to receive Entresto from [employer]  Patient ID: [id number] Effective dates: 04/03/20 through 04/03/21

## 2020-04-08 ENCOUNTER — Other Ambulatory Visit: Payer: Self-pay

## 2020-04-08 ENCOUNTER — Other Ambulatory Visit (HOSPITAL_COMMUNITY): Payer: Self-pay | Admitting: *Deleted

## 2020-04-08 ENCOUNTER — Ambulatory Visit (HOSPITAL_COMMUNITY)
Admission: RE | Admit: 2020-04-08 | Discharge: 2020-04-08 | Disposition: A | Discharge status: Home / Self Care | Payer: Medicaid Other | Source: Ambulatory Visit | Attending: Cardiology | Admitting: Cardiology

## 2020-04-08 DIAGNOSIS — I5022 Chronic systolic (congestive) heart failure: Secondary | ICD-10-CM

## 2020-04-08 LAB — BASIC METABOLIC PANEL
Anion gap: 11 (ref 5–15)
BUN: 17 mg/dL (ref 8–23)
CO2: 25 mmol/L (ref 22–32)
Calcium: 9.4 mg/dL (ref 8.9–10.3)
Chloride: 104 mmol/L (ref 98–111)
Creatinine, Ser: 1.16 mg/dL (ref 0.61–1.24)
GFR calc Af Amer: >60 mL/min (ref >60)
GFR calc non Af Amer: >60 mL/min (ref >60)
Glucose, Bld: 106 mg/dL — ABNORMAL HIGH (ref 70–99)
Potassium: 4.1 mmol/L (ref 3.5–5.1)
Sodium: 140 mmol/L (ref 135–145)

## 2020-04-08 MED ORDER — ENTRESTO 24-26 MG PO TABS: 1.0000 | ORAL_TABLET | Freq: Two times a day (BID) | ORAL | 0 refills | Status: DC

## 2020-04-09 ENCOUNTER — Other Ambulatory Visit (HOSPITAL_COMMUNITY): Payer: Self-pay

## 2020-04-09 MED FILL — FUROSEMIDE 20 MG TABS: 20 | 34 days supply | Qty: 34 | Fill #1

## 2020-04-09 MED FILL — ATORVASTATIN 40 MG TABLET: 40 | 30 days supply | Qty: 30 | Fill #1

## 2020-04-09 NOTE — Progress Notes (Signed)
Paramedicine Encounter    Patient ID: Corey Brady, male    DOB: [birth year], 64 y.o.   MRN: [id number]   Patient Care Team: Corey Saupe, MD as PCP - General (Family Medicine) Corey Brady, Corey Buckles, MD as PCP - Advanced Heart Failure (Cardiology)  Patient Active Problem List   Diagnosis Date Noted  . Acute on chronic systolic (congestive) heart failure (HCC) 03/12/2020  . S/P CABG (coronary artery bypass graft) 01/30/2020  . Acute heart failure (HCC) 01/23/2020    Current Outpatient Medications:  .  apixaban (ELIQUIS) 5 MG TABS tablet, Take 1 tablet (5 mg total) by mouth 2 (two) times daily., Disp: 180 tablet, Rfl: 3 .  aspirin 81 MG chewable tablet, Chew 1 tablet (81 mg total) by mouth daily., Disp:  , Rfl:  .  atorvastatin (LIPITOR) 40 MG tablet, Take 1 tablet (40 mg total) by mouth daily., Disp: 90 tablet, Rfl: 3 .  doxycycline (VIBRA-TABS) 100 MG tablet, Take 1 tablet (100 mg total) by mouth every 12 (twelve) hours. (Patient not taking: Reported on 03/26/2020), Disp: 10 tablet, Rfl: 0 .  furosemide (LASIX) 40 MG tablet, Take 1 tablet (40 mg total) by mouth daily., Disp: 30 tablet, Rfl: 5 .  hydrocerin (EUCERIN) CREA, Apply 1 application topically 2 (two) times daily., Disp: 228 g, Rfl: 0 .  sacubitril-valsartan (ENTRESTO) 24-26 MG, Take 1 tablet by mouth 2 (two) times daily., Disp: 60 tablet, Rfl: 0 .  spironolactone (ALDACTONE) 25 MG tablet, Take 0.5 tablets (12.5 mg total) by mouth daily., Disp: 25 tablet, Rfl: 3 .  triamcinolone cream (KENALOG) 0.1 %, Apply 1 application topically 2 (two) times daily., Disp: 45 g, Rfl: 0 Allergies  Allergen Reactions  . Other Other (See Comments)    No blood products      Social History   Socioeconomic History  . Marital status: Single    Spouse name: Not on file  . Number of children: Not on file  . Years of education: Not on file  . Highest education level: Not on file  Occupational History  . Not on file  Tobacco Use  . Smoking  status: Never Smoker  . Smokeless tobacco: Never Used  Substance and Sexual Activity  . Alcohol use: Yes  . Drug use: Not on file  . Sexual activity: Not on file  Other Topics Concern  . Not on file  Social History Narrative  . Not on file   Social Determinants of Health   Financial Resource Strain: Low Risk   . Difficulty of Paying Living Expenses: Not very hard  Food Insecurity: No Food Insecurity  . Worried About Programme researcher, broadcasting/film/video in the Last Year: Never true  . Ran Out of Food in the Last Year: Never true  Transportation Needs: No Transportation Needs  . Lack of Transportation (Medical): No  . Lack of Transportation (Non-Medical): No  Physical Activity:   . Days of Exercise per Week:   . Minutes of Exercise per Session:   Stress:   . Feeling of Stress :   Social Connections:   . Frequency of Communication with Friends and Family:   . Frequency of Social Gatherings with Friends and Family:   . Attends Religious Services:   . Active Member of Clubs or Organizations:   . Attends Banker Meetings:   Marland Kitchen Marital Status:   Intimate Partner Violence:   . Fear of Current or Ex-Partner:   . Emotionally Abused:   Marland Kitchen Physically Abused:   .  Sexually Abused:     Physical Exam Cardiovascular:     Rate and Rhythm: Normal rate and regular rhythm.     Pulses: Normal pulses.  Pulmonary:     Effort: Pulmonary effort is normal.     Breath sounds: Normal breath sounds.  Musculoskeletal:        General: Normal range of motion.     Right lower leg: No edema.     Left lower leg: No edema.  Skin:    General: Skin is warm and dry.     Capillary Refill: Capillary refill takes less than 2 seconds.  Neurological:     Mental Status: He is alert and oriented to person, place, and time.  Psychiatric:        Mood and Affect: Mood normal.         Future Appointments  Date Time Provider Department Center  04/25/2020  3:30 PM Corey Lemming, MD [facility]  04/30/2020  1:00 PM [facility] PHARMACY [facility] None  05/24/2020 10:30 AM Corey Matar, MD [facility] None  05/27/2020 12:00 PM [facility] PA/NP [facility] None    BP 121/79 (BP Location: Right Arm, Patient Position: Sitting, Cuff Size: Normal)   Pulse 80   Resp 16   Wt 219 lb (99.3 kg)   SpO2 98%   BMI 29.70 kg/m   Weight yesterday- 219.3 lb Last visit weight- 220 lb  Corey Brady was seen at home today and reported feeling well. He denied chest pain, SOB, headache, dizziness, orthopnea, fever or cough over the past week. He reported being compliant with his medications and his weight has been stable. His medications were verified and his pillbox was checked for accuracy. Corey Brady is  scheduled for home delivery and he has 30 days free from the pharmacy at present. Additionally, I ordered lasix and atorvastatin from outpatient pharmacy and Corey Brady advised he would pick it up in the coming days. I will follow up next week unless it becomes necessary sooner.  Jacqualine Code, EMT 04/09/20  ACTION: Home visit completed Next visit planned for 1 week

## 2020-04-11 NOTE — Telephone Encounter (Signed)
Spoke to patient's brother regarding approval and provided the phone number to [employer].  Archer Asa, CPhT

## 2020-04-16 ENCOUNTER — Other Ambulatory Visit (HOSPITAL_COMMUNITY): Payer: Self-pay

## 2020-04-16 NOTE — Progress Notes (Signed)
Paramedicine Encounter    Patient ID: Corey Brady, male    DOB: September 23, 1956, 64 y.o.   MRN: [id number]   Patient Care Team: Cain Saupe, MD as PCP - General (Family Medicine) Bensimhon, Bevelyn Buckles, MD as PCP - Advanced Heart Failure (Cardiology)  Patient Active Problem List   Diagnosis Date Noted  . Acute on chronic systolic (congestive) heart failure (HCC) 03/12/2020  . S/P CABG (coronary artery bypass graft) 01/30/2020  . Acute heart failure (HCC) 01/23/2020    Current Outpatient Medications:  .  apixaban (ELIQUIS) 5 MG TABS tablet, Take 1 tablet (5 mg total) by mouth 2 (two) times daily., Disp: 180 tablet, Rfl: 3 .  aspirin 81 MG chewable tablet, Chew 1 tablet (81 mg total) by mouth daily., Disp:  , Rfl:  .  atorvastatin (LIPITOR) 40 MG tablet, Take 1 tablet (40 mg total) by mouth daily., Disp: 90 tablet, Rfl: 3 .  doxycycline (VIBRA-TABS) 100 MG tablet, Take 1 tablet (100 mg total) by mouth every 12 (twelve) hours. (Patient not taking: Reported on 03/26/2020), Disp: 10 tablet, Rfl: 0 .  furosemide (LASIX) 40 MG tablet, Take 1 tablet (40 mg total) by mouth daily., Disp: 30 tablet, Rfl: 5 .  hydrocerin (EUCERIN) CREA, Apply 1 application topically 2 (two) times daily. (Patient not taking: Reported on 04/09/2020), Disp: 228 g, Rfl: 0 .  sacubitril-valsartan (ENTRESTO) 24-26 MG, Take 1 tablet by mouth 2 (two) times daily., Disp: 60 tablet, Rfl: 0 .  spironolactone (ALDACTONE) 25 MG tablet, Take 0.5 tablets (12.5 mg total) by mouth daily., Disp: 25 tablet, Rfl: 3 .  triamcinolone cream (KENALOG) 0.1 %, Apply 1 application topically 2 (two) times daily., Disp: 45 g, Rfl: 0 Allergies  Allergen Reactions  . Other Other (See Comments)    No blood products      Social History   Socioeconomic History  . Marital status: Single    Spouse name: Not on file  . Number of children: Not on file  . Years of education: Not on file  . Highest education level: Not on file  Occupational History  .  Not on file  Tobacco Use  . Smoking status: Never Smoker  . Smokeless tobacco: Never Used  Substance and Sexual Activity  . Alcohol use: Yes  . Drug use: Not on file  . Sexual activity: Not on file  Other Topics Concern  . Not on file  Social History Narrative  . Not on file   Social Determinants of Health   Financial Resource Strain: Low Risk   . Difficulty of Paying Living Expenses: Not very hard  Food Insecurity: No Food Insecurity  . Worried About Programme researcher, broadcasting/film/video in the Last Year: Never true  . Ran Out of Food in the Last Year: Never true  Transportation Needs: No Transportation Needs  . Lack of Transportation (Medical): No  . Lack of Transportation (Non-Medical): No  Physical Activity:   . Days of Exercise per Week:   . Minutes of Exercise per Session:   Stress:   . Feeling of Stress :   Social Connections:   . Frequency of Communication with Friends and Family:   . Frequency of Social Gatherings with Friends and Family:   . Attends Religious Services:   . Active Member of Clubs or Organizations:   . Attends Banker Meetings:   Marland Kitchen Marital Status:   Intimate Partner Violence:   . Fear of Current or Ex-Partner:   . Emotionally Abused:   .  Physically Abused:   . Sexually Abused:     Physical Exam Cardiovascular:     Rate and Rhythm: Normal rate. Rhythm irregular.     Pulses: Normal pulses.  Pulmonary:     Effort: Pulmonary effort is normal.  Musculoskeletal:        General: Normal range of motion.     Right lower leg: No edema.     Left lower leg: No edema.  Skin:    General: Skin is warm and dry.     Capillary Refill: Capillary refill takes less than 2 seconds.  Neurological:     Mental Status: He is oriented to person, place, and time.  Psychiatric:        Mood and Affect: Mood normal.         Future Appointments  Date Time Provider Department Center  04/25/2020  3:30 PM Regan Lemming, MD [facility]   04/30/2020  1:00 PM [facility] PH[facility]ACY [facility] None  05/24/2020 10:30 AM Marcine Matar, MD [facility] None  05/27/2020 12:00 PM [facility] PA/NP [facility] None    BP 111/63 (BP Location: Left Arm, Patient Position: Sitting, Cuff Size: Normal)   Pulse 91   Resp 16   Wt 218 lb (98.9 kg)   SpO2 99%   BMI 29.57 kg/m   Weight yesterday- 218.3 lb Last visit weight- 219 lb  [name] was seen at home today and reported feeling well. He denied chest pain, SOB, headache, dizziness, orthopnea, fever or cough since our last visit. He stated he has been compliant with his medications over the past week and his weight has been stable. Upon checking his vita signs his radial pulse was irregular. I obtained a 12-lead ECG and found him to be in a sinus rhythm with PAC's. His medications were verified and his pillbox was checked for accuracy. He had only added 20 mg of lasix to his box rather that the 40 mg he is prescribed. I made the adjustment and pointed out the dosage on the bottle for him. He was understanding and stated he would look closer next time. I will follow up next week.   Jacqualine Code, EMT 04/16/20  ACTION: Home visit completed Next visit planned for 1 week

## 2020-04-25 ENCOUNTER — Other Ambulatory Visit: Payer: Self-pay

## 2020-04-25 ENCOUNTER — Ambulatory Visit (INDEPENDENT_AMBULATORY_CARE_PROVIDER_SITE_OTHER): Payer: Self-pay | Admitting: Cardiology

## 2020-04-25 ENCOUNTER — Encounter: Payer: Self-pay | Admitting: Cardiology

## 2020-04-25 VITALS — BP 120/72 | HR 101 | Ht 72.0 in | Wt 220.8 lb

## 2020-04-25 DIAGNOSIS — I4892 Unspecified atrial flutter: Secondary | ICD-10-CM

## 2020-04-25 DIAGNOSIS — I5022 Chronic systolic (congestive) heart failure: Secondary | ICD-10-CM

## 2020-04-25 DIAGNOSIS — I2510 Atherosclerotic heart disease of native coronary artery without angina pectoris: Secondary | ICD-10-CM

## 2020-04-25 MED ORDER — METOPROLOL SUCCINATE ER 25 MG PO TB24
25.0000 mg | ORAL_TABLET | Freq: Every day | ORAL | 11 refills | Status: DC
Start: 2020-04-25 — End: 2020-11-28

## 2020-04-25 NOTE — Patient Instructions (Signed)
Medication Instructions:  Your physician has recommended you make the following change in your medication:  START Metoprolol succinate (Toprol XL) 25 mg once daily  *If you need a refill on your cardiac medications before your next appointment, please call your pharmacy*   Lab Work: None Ordered If you have labs (blood work) drawn today and your tests are completely normal, you will receive your results only by: Marland Kitchen MyChart Message (if you have MyChart) OR . A paper copy in the mail If you have any lab test that is abnormal or we need to change your treatment, we will call you to review the results.   Testing/Procedures: None Ordered   Follow-Up: At [facility], you and your health needs are our priority.  As part of our continuing mission to provide you with exceptional heart care, we have created designated Provider Care Teams.  These Care Teams include your primary Cardiologist (physician) and Advanced Practice Providers (APPs -  Physician Assistants and Nurse Practitioners) who all work together to provide you with the care you need, when you need it.  We recommend signing up for the patient portal called "MyChart".  Sign up information is provided on this After Visit Summary.  MyChart is used to connect with patients for Virtual Visits (Telemedicine).  Patients are able to view lab/test results, encounter notes, upcoming appointments, etc.  Non-urgent messages can be sent to your provider as well.   To learn more about what you can do with MyChart, go to [email or web address].    Your next appointment:   6 month(s)  The format for your next appointment:   In Person  Provider:   You may see Loman Brooklyn, MD or one of the following Advanced Practice Providers on your designated Care Team:    Gypsy Balsam, NP  Francis Dowse, PA-C  Casimiro Needle "[place]" [place], [place]

## 2020-04-25 NOTE — Progress Notes (Signed)
Electrophysiology Office Note   Date:  04/25/2020   ID:  Corey Brady, DOB July 14, 1956, MRN [id number]  PCP:  Cain Saupe, MD  Cardiologist:  Bensimhon Primary Electrophysiologist:  Kaleen Rochette Jorja Loa, MD    Chief Complaint: atrial flutter   History of Present Illness: Corey Brady is a 64 y.o. male who is being seen today for the evaluation of atrial flutter at the request of Bensimhon. Presenting today for electrophysiology evaluation.  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  Corey is status post atrial flutter ablation 03/15/2020.  Today, Corey denies symptoms of palpitations, chest pain, shortness of breath, orthopnea, PND, lower extremity edema, claudication, dizziness, presyncope, syncope, bleeding, or neurologic sequela. The patient is tolerating medications without difficulties.  Since his ablation, Corey is felt much better.  Corey is gaining much more strength and energy.  Corey is noted no further tachycardia.   Past Medical History:  Diagnosis Date  . CHF (congestive heart failure) (HCC)   . Gout   . No blood products 01/27/2020   Past Surgical History:  Procedure Laterality Date  . A-FLUTTER ABLATION N/A 03/15/2020   Procedure: A-FLUTTER ABLATION;  Surgeon: Regan Lemming, MD;  [facility] INVASIVE CV LAB;  Service: Cardiovascular;  Laterality: N/A;  . CARDIOVERSION N/A 02/06/2020   Procedure: CARDIOVERSION;  Surgeon: Dolores Patty, MD;  Location: [facility] ENDOSCOPY;  Service: Cardiovascular;  Laterality: N/A;  . CORONARY ARTERY BYPASS GRAFT N/A 01/30/2020   Procedure: OFF PUMP CORONARY ARTERY BYPASS GRAFTING (CABG) TIMES ONE;  Surgeon: Corliss Skains, MD;  Location: [facility] OR;  Service: Open Heart Surgery;  Laterality: N/A;  swan only  . RIGHT/LEFT HEART CATH AND CORONARY ANGIOGRAPHY N/A 01/26/2020   Procedure: RIGHT/LEFT HEART CATH AND CORONARY ANGIOGRAPHY;  Surgeon: Dolores Patty, MD;  Location: [facility] INVASIVE  CV LAB;  Service: Cardiovascular;  Laterality: N/A;  . TEE WITHOUT CARDIOVERSION N/A 01/30/2020   Procedure: TRANSESOPHAGEAL ECHOCARDIOGRAM (TEE);  Surgeon: Corliss Skains, MD;  Location: [facility] OR;  Service: Open Heart Surgery;  Laterality: N/A;  . TEE WITHOUT CARDIOVERSION N/A 02/06/2020   Procedure: TRANSESOPHAGEAL ECHOCARDIOGRAM (TEE);  Surgeon: Dolores Patty, MD;  Location: [facility] ENDOSCOPY;  Service: Cardiovascular;  Laterality: N/A;     Current Outpatient Medications  Medication Sig Dispense Refill  . apixaban (ELIQUIS) 5 MG TABS tablet Take 1 tablet (5 mg total) by mouth 2 (two) times daily. 180 tablet 3  . aspirin 81 MG chewable tablet Chew 1 tablet (81 mg total) by mouth daily.    Marland Kitchen atorvastatin (LIPITOR) 40 MG tablet Take 1 tablet (40 mg total) by mouth daily. 90 tablet 3  . doxycycline (VIBRA-TABS) 100 MG tablet Take 1 tablet (100 mg total) by mouth every 12 (twelve) hours. 10 tablet 0  . furosemide (LASIX) 40 MG tablet Take 1 tablet (40 mg total) by mouth daily. 30 tablet 5  . hydrocerin (EUCERIN) CREA Apply 1 application topically 2 (two) times daily. 228 g 0  . sacubitril-valsartan (ENTRESTO) 24-26 MG Take 1 tablet by mouth 2 (two) times daily. 60 tablet 0  . spironolactone (ALDACTONE) 25 MG tablet Take 0.5 tablets (12.5 mg total) by mouth daily. 25 tablet 3  . triamcinolone cream (KENALOG) 0.1 % Apply 1 application topically 2 (two) times daily. 45 g 0  . metoprolol succinate (TOPROL XL) 25 MG 24 hr tablet Take 1 tablet (25 mg total) by mouth daily. 30 tablet 11   No current facility-administered medications for  this visit.    Allergies:   Other   Social History:  The patient  reports that Corey has never Brady. Corey has never used smokeless tobacco. Corey reports current alcohol use.   Family History:  The patient's family history includes Gout in his sister.    ROS:  Please see the history of present illness.   Otherwise, review of systems is positive for none.   All other  systems are reviewed and negative.    PHYSICAL EXAM: VS:  BP 120/72   Pulse 101   Ht 6' (1.829 m)   Wt (!) 220 lb 12.8 oz (100.2 kg)   SpO2 98%   BMI 29.95 kg/m  , BMI Body mass index is 29.95 kg/m. GEN: Well nourished, well developed, in no acute distress  HEENT: normal  Neck: no JVD, carotid bruits, or masses Cardiac: RRR; no murmurs, rubs, or gallops,no edema  Respiratory:  clear to auscultation bilaterally, normal work of breathing GI: soft, nontender, nondistended, + BS MS: no deformity or atrophy  Skin: warm and dry Neuro:  Strength and sensation are intact Psych: euthymic mood, full affect  EKG:  EKG is ordered today. Personal review of the ekg ordered shows sinus rhythm, rate 101, diffuse T wave inversions   Recent Labs: 03/12/2020: ALT 28; B Natriuretic Peptide 533.8; TSH 4.165 03/14/2020: Magnesium 2.1 03/29/2020: Hemoglobin 13.1; Platelets 215 04/08/2020: BUN 17; Creatinine, Ser 1.16; Potassium 4.1; Sodium 140    Lipid Panel     Component Value Date/Time   CHOL 116 01/23/2020 1700   TRIG 56 01/23/2020 1700   HDL 36 (L) 01/23/2020 1700   CHOLHDL 3.2 01/23/2020 1700   VLDL 11 01/23/2020 1700   LDLCALC 69 01/23/2020 1700     Wt Readings from Last 3 Encounters:  04/25/20 (!) 220 lb 12.8 oz (100.2 kg)  04/16/20 218 lb (98.9 kg)  04/09/20 219 lb (99.3 kg)      Other studies Reviewed: Additional studies/ records that were reviewed today include: TTE 03/15/2020 Review of the above records today demonstrates:  1. Left ventricular ejection fraction, by estimation, is 25 to 30%. The  left ventricle has severely decreased function. The left ventricle  demonstrates regional wall motion abnormalities (see scoring  diagram/findings for description). The left  ventricular internal cavity size was moderately to severely dilated. Left  ventricular diastolic function could not be evaluated. Extensive  infarction in the LAD artery distribution and global hypokinesis.    2. Right ventricular systolic function is mildly reduced. The right  ventricular size is normal. There is mildly elevated pulmonary artery  systolic pressure. The estimated right ventricular systolic pressure is  37.3 mmHg.  3. Left atrial size was moderately dilated.  4. Right atrial size was moderately dilated.  5. The mitral valve is normal in structure. Trivial mitral valve  regurgitation.  6. The aortic valve is normal in structure. Aortic valve regurgitation is  not visualized.    ASSESSMENT AND PLAN:  1.  Typical atrial flutter: Status post ablation 03/15/2020.  Currently on Eliquis.  Fortunately Corey remains in sinus rhythm post ablation.  We Tryphena Perkovich plan to stop Eliquis today.  2.  Nonischemic cardiomyopathy: Possibly due to an alcoholic cardiomyopathy.  Jovin Fester need a repeat echo in 3 months to assess his ejection fraction post ablation to see if it has improved above 35%.  We Sruthi Maurer start him on Toprol-XL, low-dose as Corey had junctional rhythm immediately following his ablation.  Islam Villescas discuss with his primary cardiologist.  3.  Coronary artery disease: Left heart cath with severe one-vessel disease.  Status post one-vessel CABG with LIMA to the LAD 01/30/2020.  Continue aspirin and atorvastatin.  Current medicines are reviewed at length with the patient today.   The patient does not have concerns regarding his medicines.  The following changes were made today: Start Toprol-XL  Labs/ tests ordered today include:  Orders Placed This Encounter  Procedures  . EKG 12-Lead     Disposition:   FU with Naythen Heikkila 6 months  Signed, Kameryn Davern Jorja Loa, MD  04/25/2020 3:46 PM     [facility] [address] [place] [address] [phone] (office) [phone] (fax)

## 2020-04-30 ENCOUNTER — Other Ambulatory Visit: Payer: Self-pay

## 2020-04-30 ENCOUNTER — Ambulatory Visit (HOSPITAL_COMMUNITY)
Admission: RE | Admit: 2020-04-30 | Discharge: 2020-04-30 | Disposition: A | Discharge status: Home / Self Care | Payer: Medicaid Other | Source: Ambulatory Visit | Attending: Internal Medicine | Admitting: Internal Medicine

## 2020-04-30 ENCOUNTER — Encounter (HOSPITAL_COMMUNITY): Payer: Self-pay

## 2020-04-30 VITALS — BP 118/79 | HR 79 | Wt 222.0 lb

## 2020-04-30 DIAGNOSIS — I2510 Atherosclerotic heart disease of native coronary artery without angina pectoris: Secondary | ICD-10-CM | POA: Diagnosis not present

## 2020-04-30 DIAGNOSIS — Z951 Presence of aortocoronary bypass graft: Secondary | ICD-10-CM | POA: Insufficient documentation

## 2020-04-30 DIAGNOSIS — I5022 Chronic systolic (congestive) heart failure: Secondary | ICD-10-CM | POA: Insufficient documentation

## 2020-04-30 DIAGNOSIS — I4892 Unspecified atrial flutter: Secondary | ICD-10-CM | POA: Insufficient documentation

## 2020-04-30 MED ORDER — SPIRONOLACTONE 25 MG PO TABS: 12.5000 mg | ORAL_TABLET | Freq: Every day | ORAL | 6 refills | Status: DC

## 2020-04-30 MED ORDER — SPIRONOLACTONE 25 MG PO TABS: 12.5000 mg | ORAL_TABLET | Freq: Every day | ORAL | 3 refills | Status: DC

## 2020-04-30 MED ORDER — ENTRESTO 49-51 MG PO TABS: 1.0000 | ORAL_TABLET | Freq: Two times a day (BID) | ORAL | 3 refills | Status: DC

## 2020-04-30 MED FILL — SPIRONOLACTONE 25 MG TABS: 25 | 30 days supply | Qty: 15 | Fill #0

## 2020-04-30 MED FILL — ATORVASTATIN 40 MG TABLET: 40 | 34 days supply | Qty: 34 | Fill #0

## 2020-04-30 MED FILL — FUROSEMIDE 40 MG TAB: 40 | 34 days supply | Qty: 100 | Fill #0

## 2020-04-30 MED FILL — METOPROLOL SUCCINATE ER 25: 25 | 34 days supply | Qty: 34 | Fill #0

## 2020-04-30 NOTE — Patient Instructions (Addendum)
It was a pleasure seeing you today!  MEDICATIONS: -We are changing your medications today -Increase Entresto to 49/51 mg (1 tablet) twice daily. You may take 2 tablets of the 24/26 mg strength twice daily until you receive the new strength.  -Call if you have questions about your medications.  NEXT APPOINTMENT: Return to clinic in 4 weeks with PA/NP Clinic.  In general, to take care of your heart failure: -Limit your fluid intake to 2 Liters (half-gallon) per day.   -Limit your salt intake to ideally 2-3 grams (2000-3000 mg) per day. -Weigh yourself daily and record, and bring that "weight diary" to your next appointment.  (Weight gain of 2-3 pounds in 1 day typically means fluid weight.) -The medications for your heart are to help your heart and help you live longer.   -Please contact us before stopping any of your heart medications.  Call the clinic at [phone] with questions or to reschedule future appointments.

## 2020-04-30 NOTE — Progress Notes (Signed)
PCP: Patient, No Pcp Per PCP-Cardiologist:  [name]   HPI:  64 y/o male w/ CAD, severe systolic HF and PAFL. Recently underwent CABG 5/21. Post-op course c/b low output and PAFL requiring several DC-CV. Was placed on amiodarone and dose reduced due to bradycardia.   Readmitted back to hospital from [facility] on 03/12/20 fora/c CHF w/ volume overload, recurrent ALF andbilateralLE cellulitis.     He was loaded on IV amiodarone, started on IV furosemide for CHF and started on IV vanc for cellulitis. No signs of systemic infection. WBC remained normal and he remained in AF. Abx later changed to PO doxycycline. Had good diuresis w/ IV furosemide and changed back to PO diuretics. EP was consulted for AFL and he underwentAFL ablation6/19/21. Post ablation had periods of junctional rhythm. Dig and amio heldand junctional rhythm resolved. EP recommended avoidance of rate control medications. No indication for PPM at this time. Was discharged home on 03/18/20. D/c weight was 215 lb. He is being followed by paramedicine.   Recently returned to HF Clinic for follow up 03/29/20 with Robbie Lis, PA-C. Was doing well. In NSR by EKG. Denied breakthrough palpitations. No chest pain or dyspnea. NYHA Class II. Weight was up 4 lb since discharge. He had trace bilateral LEE on exam with bilateral erythema that blanched with leg elevation. BP and HR stable.   Today he returns to HF clinic for pharmacist medication titration. At last visit with PA-C, losartan was discontinued and Entresto 24/26 mg BID was initiated. Patient reports feeling much better with improved exercise tolerance. Denies dizziness, lightheadedness, fatigue, chest pain or palpitations. Endorses feeling "faint" at times lasting 30-60 seconds at most. States this is not bothersome and is not new since starting the [place]. No SOB or DOE. Stable weights at home (218-219 lbs). He takes furosemide 40 mg daily and has not needed any extra. No LEE,  PND, or orthopnea. Reports taking medications as prescribed and tolerates medications well.  HF Medications: Metoprolol succinate 25 mg daily Entresto 24/26 mg BID Spironolactone 12.5 mg daily Furosemide 40 mg daily  Has the patient been experiencing any side effects to the medications prescribed?  no  Does the patient have any problems obtaining medications due to transportation or finances?   Yes - has no prescription insurance. Uses heart failure fund at [facility] for HF medications. Has [employer] for [employer] patient assistance.    Understanding of regimen: good Understanding of indications: good Potential of compliance: excellent - pill box with paramedic program Patient understands to avoid NSAIDs. Patient understands to avoid decongestants.    Pertinent Lab Values: 04/08/2020 . Serum creatinine 1.16, BUN 17, Potassium 4.1, Sodium 140, BNP 533.8 pg/mL (03/12/2020)   Vital Signs: . Weight: 220 lbs (last clinic weight: 221 lbs) . Blood pressure: 118/79  . Heart rate: 79 bpm   Assessment: 1. Chronic Systolic Heart Failure - ECHO 9/67 EF 15% with severe global HK, and moderate RV dysfunction. Etiology possible ETOH versus HTN (denies severe ETOH use). TSH ok. HIV NR  - 4/28 LHC with severe 1v CAD. RHC with preserved cardiac output.  - S/p CABG x 1 w/ LIMA-LAD 01/30/20 - Echo repeated 6/21, LVEF 25-30%, RV mildly reduced  - NYHAII, euvolemic on exam.  - Continue metoprolol succinate 25 mg daily.  - Increase Entresto to 49/51 mg BID. Obtains through [employer] patient assistance program.  - Continue spironolactone 12.5 mg daily   - Consider SGLT2i at future visits.  -Will need repeat echo 3  months after meds are fully optimized.   2. CAD:  - LHC 12/2019 with severe 1VCAD - S/p CABG x 1 w/ LIMA-LAD 01/30/20 - No s/s angina - on ASA 81 + statin, atorvastatin 40 mg - continue metoprolol succinate 25 mg daily as above.   5. H/O Arial Flutter  - s/p ablation  6/18 by [name]. Periods of junctional rhythm post ablation - off amio and dig -> improved - Discussed w/ EP. No role for PPM/ICD - Eliquis discontinued at most recent visit with [name] 04/25/20   Plan: 1) Medication changes: Based on clinical presentation, vital signs and recent labs will increase Entresto to 49/51 mg twice daily. 2) Follow-up: 4 weeks with NP/PA Clinic (scheduled for 05/27/20).    Karle Plumber, PharmD, BCPS, BCCP, CPP Heart Failure Clinic Pharmacist [phone]

## 2020-05-07 ENCOUNTER — Telehealth (HOSPITAL_COMMUNITY): Payer: Self-pay

## 2020-05-07 NOTE — Telephone Encounter (Signed)
I called Corey Brady to see if he was available for a visit today. He did not answer so I left a message requesting he call me back.   Jacqualine Code, EMT 05/07/20

## 2020-05-08 ENCOUNTER — Other Ambulatory Visit: Payer: Self-pay

## 2020-05-08 DIAGNOSIS — L539 Erythematous condition, unspecified: Secondary | ICD-10-CM

## 2020-05-09 ENCOUNTER — Telehealth (HOSPITAL_COMMUNITY): Payer: Self-pay

## 2020-05-09 ENCOUNTER — Other Ambulatory Visit (HOSPITAL_COMMUNITY): Payer: Self-pay

## 2020-05-09 NOTE — Telephone Encounter (Signed)
Zach EMT-P picked up 1 bottle of Entresto Samples for pt.   Lot#: [id number] Expiration: 01/2022

## 2020-05-09 NOTE — Progress Notes (Signed)
Paramedicine Encounter    Patient ID: Corey Brady, male    DOB: 1956-04-18, 64 y.o.   MRN: [id number]   Patient Care Team: Cain Saupe, MD as PCP - General (Family Medicine) Bensimhon, Bevelyn Buckles, MD as PCP - Advanced Heart Failure (Cardiology) Regan Lemming, MD as PCP - Electrophysiology (Cardiology)  Patient Active Problem List   Diagnosis Date Noted  . Acute on chronic systolic (congestive) heart failure (HCC) 03/12/2020  . S/P CABG (coronary artery bypass graft) 01/30/2020  . Acute heart failure (HCC) 01/23/2020    Current Outpatient Medications:  .  aspirin 81 MG chewable tablet, Chew 1 tablet (81 mg total) by mouth daily., Disp:  , Rfl:  .  atorvastatin (LIPITOR) 40 MG tablet, Take 1 tablet (40 mg total) by mouth daily., Disp: 90 tablet, Rfl: 3 .  furosemide (LASIX) 40 MG tablet, Take 1 tablet (40 mg total) by mouth daily., Disp: 30 tablet, Rfl: 5 .  metoprolol succinate (TOPROL XL) 25 MG 24 hr tablet, Take 1 tablet (25 mg total) by mouth daily., Disp: 30 tablet, Rfl: 11 .  sacubitril-valsartan (ENTRESTO) 49-51 MG, Take 1 tablet by mouth 2 (two) times daily., Disp: 180 tablet, Rfl: 3 .  spironolactone (ALDACTONE) 25 MG tablet, Take 0.5 tablets (12.5 mg total) by mouth daily., Disp: 15 tablet, Rfl: 6 .  hydrocerin (EUCERIN) CREA, Apply 1 application topically 2 (two) times daily., Disp: 228 g, Rfl: 0 .  triamcinolone cream (KENALOG) 0.1 %, Apply 1 application topically 2 (two) times daily. (Patient not taking: Reported on 04/30/2020), Disp: 45 g, Rfl: 0 Allergies  Allergen Reactions  . Other Other (See Comments)    No blood products      Social History   Socioeconomic History  . Marital status: Single    Spouse name: Not on file  . Number of children: Not on file  . Years of education: Not on file  . Highest education level: Not on file  Occupational History  . Not on file  Tobacco Use  . Smoking status: Never Smoker  . Smokeless tobacco: Never Used  Substance  and Sexual Activity  . Alcohol use: Yes  . Drug use: Not on file  . Sexual activity: Not on file  Other Topics Concern  . Not on file  Social History Narrative  . Not on file   Social Determinants of Health   Financial Resource Strain: Low Risk   . Difficulty of Paying Living Expenses: Not very hard  Food Insecurity: No Food Insecurity  . Worried About Programme researcher, broadcasting/film/video in the Last Year: Never true  . Ran Out of Food in the Last Year: Never true  Transportation Needs: No Transportation Needs  . Lack of Transportation (Medical): No  . Lack of Transportation (Non-Medical): No  Physical Activity:   . Days of Exercise per Week:   . Minutes of Exercise per Session:   Stress:   . Feeling of Stress :   Social Connections:   . Frequency of Communication with Friends and Family:   . Frequency of Social Gatherings with Friends and Family:   . Attends Religious Services:   . Active Member of Clubs or Organizations:   . Attends Banker Meetings:   Marland Kitchen Marital Status:   Intimate Partner Violence:   . Fear of Current or Ex-Partner:   . Emotionally Abused:   Marland Kitchen Physically Abused:   . Sexually Abused:     Physical Exam Cardiovascular:     Rate  and Rhythm: Normal rate and regular rhythm.     Pulses: Normal pulses.  Pulmonary:     Effort: Pulmonary effort is normal.     Breath sounds: Normal breath sounds.  Musculoskeletal:        General: Normal range of motion.     Right lower leg: No edema.     Left lower leg: No edema.  Skin:    General: Skin is warm and dry.     Capillary Refill: Capillary refill takes less than 2 seconds.  Neurological:     Mental Status: He is alert and oriented to person, place, and time.  Psychiatric:        Mood and Affect: Mood normal.         Future Appointments  Date Time Provider Department Center  05/23/2020  1:00 PM [facility] - [facility]  05/23/2020  1:45 PM [facility] PA [facility]  05/24/2020 10:30 AM Marcine Matar, MD [facility] None  05/27/2020 12:00 PM [facility] PA/NP [facility] None    BP 123/82 (BP Location: Left Arm, Patient Position: Sitting, Cuff Size: Normal)   Pulse 77   Resp 16   Wt 217 lb 3.2 oz (98.5 kg)   SpO2 99%   BMI 29.46 kg/m   Weight yesterday- 217.7 lb Last visit weight- 222 lb  Corey Brady was seen at home today and reported feeling well. He denied chest pain, SOB, headache, dizziness, orthopnea, fever or cough over the past week. He reported being compliant with his medications and his weight has been stable. He ran out of [facility] yesterday but was given a sample bottle today and Rosetta Posner will help him schedule from [employer] delivery tomorrow. His medications were verified and his pillbox was refilled. I will follow up next week.   Jacqualine Code, EMT 05/09/20  ACTION: Home visit completed Next visit planned for 1 week

## 2020-05-10 ENCOUNTER — Telehealth (HOSPITAL_COMMUNITY): Payer: Self-pay | Admitting: Licensed Clinical Social Worker

## 2020-05-10 NOTE — Telephone Encounter (Signed)
Community paramedic asked CSW for assistance in ordering pt Entresto through [employer].  CSW and pt called [employer] on joint call and assisted with ordering first shipment of medication  Anticipated delivery on Tuesday 8/17  No further needs at this time- CSW will continue to follow and assist as needed  Burna Sis, LCSW Clinical Social Worker [facility] Desk#: [phone] Cell#: [phone]

## 2020-05-14 ENCOUNTER — Other Ambulatory Visit (HOSPITAL_COMMUNITY): Payer: Self-pay

## 2020-05-14 NOTE — Progress Notes (Signed)
Paramedicine Encounter    Patient ID: Corey Brady, male    DOB: 1956-07-04, 64 y.o.   MRN: [id number]   Patient Care Team: Corey Saupe, MD as PCP - General (Family Medicine) Corey, Bevelyn Buckles, MD as PCP - Advanced Heart Failure (Cardiology) Corey Lemming, MD as PCP - Electrophysiology (Cardiology)  Patient Active Problem List   Diagnosis Date Noted  . Acute on chronic systolic (congestive) heart failure (HCC) 03/12/2020  . S/P CABG (coronary artery bypass graft) 01/30/2020  . Acute heart failure (HCC) 01/23/2020    Current Outpatient Medications:  .  aspirin 81 MG chewable tablet, Chew 1 tablet (81 mg total) by mouth daily., Disp:  , Rfl:  .  atorvastatin (LIPITOR) 40 MG tablet, Take 1 tablet (40 mg total) by mouth daily., Disp: 90 tablet, Rfl: 3 .  furosemide (LASIX) 40 MG tablet, Take 1 tablet (40 mg total) by mouth daily., Disp: 30 tablet, Rfl: 5 .  hydrocerin (EUCERIN) CREA, Apply 1 application topically 2 (two) times daily., Disp: 228 g, Rfl: 0 .  metoprolol succinate (TOPROL XL) 25 MG 24 hr tablet, Take 1 tablet (25 mg total) by mouth daily., Disp: 30 tablet, Rfl: 11 .  sacubitril-valsartan (ENTRESTO) 49-51 MG, Take 1 tablet by mouth 2 (two) times daily., Disp: 180 tablet, Rfl: 3 .  spironolactone (ALDACTONE) 25 MG tablet, Take 0.5 tablets (12.5 mg total) by mouth daily., Disp: 15 tablet, Rfl: 6 .  triamcinolone cream (KENALOG) 0.1 %, Apply 1 application topically 2 (two) times daily. (Patient not taking: Reported on 04/30/2020), Disp: 45 g, Rfl: 0 Allergies  Allergen Reactions  . Other Other (See Comments)    No blood products      Social History   Socioeconomic History  . Marital status: Single    Spouse name: Not on file  . Number of children: Not on file  . Years of education: Not on file  . Highest education level: Not on file  Occupational History  . Not on file  Tobacco Use  . Smoking status: Never Smoker  . Smokeless tobacco: Never Used  Substance  and Sexual Activity  . Alcohol use: Yes  . Drug use: Not on file  . Sexual activity: Not on file  Other Topics Concern  . Not on file  Social History Narrative  . Not on file   Social Determinants of Health   Financial Resource Strain: Low Risk   . Difficulty of Paying Living Expenses: Not very hard  Food Insecurity: No Food Insecurity  . Worried About Programme researcher, broadcasting/film/video in the Last Year: Never true  . Ran Out of Food in the Last Year: Never true  Transportation Needs: No Transportation Needs  . Lack of Transportation (Medical): No  . Lack of Transportation (Non-Medical): No  Physical Activity:   . Days of Exercise per Week:   . Minutes of Exercise per Session:   Stress:   . Feeling of Stress :   Social Connections:   . Frequency of Communication with Friends and Family:   . Frequency of Social Gatherings with Friends and Family:   . Attends Religious Services:   . Active Member of Clubs or Organizations:   . Attends Banker Meetings:   Marland Kitchen Marital Status:   Intimate Partner Violence:   . Fear of Current or Ex-Partner:   . Emotionally Abused:   Marland Kitchen Physically Abused:   . Sexually Abused:     Physical Exam Cardiovascular:     Rate  and Rhythm: Normal rate and regular rhythm.     Pulses: Normal pulses.  Pulmonary:     Effort: Pulmonary effort is normal.     Breath sounds: Normal breath sounds.  Musculoskeletal:        General: Normal range of motion.     Right lower leg: No edema.     Left lower leg: No edema.  Skin:    General: Skin is warm and dry.     Capillary Refill: Capillary refill takes less than 2 seconds.  Neurological:     Mental Status: He is alert and oriented to person, place, and time.  Psychiatric:        Mood and Affect: Mood normal.         Future Appointments  Date Time Provider Department Center  05/23/2020  1:00 PM [facility] 5 - [facility]  05/23/2020  1:45 PM [facility] PA [facility]  05/24/2020 10:30 AM Corey Matar, MD [facility] None  05/27/2020 12:00 PM [facility] PA/NP [facility] None    BP 118/70 (BP Location: Left Arm, Patient Position: Sitting, Cuff Size: Normal)   Pulse 76   Resp 16   Wt 216 lb 3.2 oz (98.1 kg)   SpO2 98%   BMI 29.32 kg/m   Weight yesterday- 218 lb Last visit weight- 217.2 lb  [name] was seen at home today and reported feeling well. He denied chest pain, SOB, headache, dizziness, orthopnea fever or cough over the past week. He stated he has been compliant with his medications over the past week and his weight has been stable. His medications were verified and his pillbox was refilled. I will follow up next week.   Corey Brady, EMT 05/14/20  ACTION: Home visit completed Next visit planned for 1 week

## 2020-05-23 ENCOUNTER — Other Ambulatory Visit: Payer: Self-pay

## 2020-05-23 ENCOUNTER — Ambulatory Visit (HOSPITAL_COMMUNITY)
Admission: RE | Admit: 2020-05-23 | Discharge: 2020-05-23 | Disposition: A | Discharge status: Home / Self Care | Payer: Medicaid Other | Source: Ambulatory Visit | Attending: Vascular Surgery | Admitting: Vascular Surgery

## 2020-05-23 ENCOUNTER — Ambulatory Visit (INDEPENDENT_AMBULATORY_CARE_PROVIDER_SITE_OTHER): Payer: Medicaid Other | Admitting: Physician Assistant

## 2020-05-23 VITALS — BP 98/66 | HR 81 | Temp 97.9°F | Resp 20 | Ht 72.0 in | Wt 222.0 lb

## 2020-05-23 DIAGNOSIS — I872 Venous insufficiency (chronic) (peripheral): Secondary | ICD-10-CM | POA: Diagnosis not present

## 2020-05-23 DIAGNOSIS — L539 Erythematous condition, unspecified: Secondary | ICD-10-CM | POA: Diagnosis not present

## 2020-05-23 DIAGNOSIS — I8393 Asymptomatic varicose veins of bilateral lower extremities: Secondary | ICD-10-CM

## 2020-05-23 NOTE — Progress Notes (Signed)
Requested by:  Cain Saupe, MD [address] [place],  [place] [address]  Reason for consultation: recurrent bilateral lower extremity erythema that blanches with elevation of the legs and swelling  History of Present Illness   Corey Brady is a 64 y.o. (Feb 15, 1956) male who presents for evaluation of recurrent bilateral lower extremity erythema that blanches with elevation of the legs and swelling. This began after he was hospitalized in May of this year. He states when he ambulates or on prolonged sitting he notices the swelling in his legs. Both legs are the same regarding symptoms. He states that he has been wearing OTC knee high compression stockings to help with the swelling. When he is not wearing them he will get mild swelling. He does sometimes elevate his legs which also helps. He does notice that his legs become very red especially right after taking the stockings off or if his legs are dependent for some time. He denies any aching, heaviness, tiredness, throbbing, or pain. He has no visible varicose veins. He denies any claudication like symptoms or rest pain. He has no hx of DVT and no family history of DVT or venous disease that he is aware of. 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  Venous symptoms include: swelling, redness Onset/duration: > 3 months Occupation: worked in Advertising account planner Aggravating factors: sitting, standing Alleviating factors: elevation, compression Compression: OTC Helps:  yes Pain medications: none Previous vein procedures:  Never History of DVT: No  Past Medical History:  Diagnosis Date  . CHF (congestive heart failure) (HCC)   . Gout   . No blood products 01/27/2020    Past Surgical History:  Procedure Laterality Date  . A-FLUTTER ABLATION N/A 03/15/2020   Procedure: A-FLUTTER ABLATION;  Surgeon: Regan Lemming, MD;  [facility];  Service: Cardiovascular;  Laterality: N/A;    . CARDIOVERSION N/A 02/06/2020   Procedure: CARDIOVERSION;  Surgeon: Dolores Patty, MD;  Location: [facility] ENDOSCOPY;  Service: Cardiovascular;  Laterality: N/A;  . CORONARY ARTERY BYPASS GRAFT N/A 01/30/2020   Procedure: OFF PUMP CORONARY ARTERY BYPASS GRAFTING (CABG) TIMES ONE;  Surgeon: Corliss Skains, MD;  Location: [facility] OR;  Service: Open Heart Surgery;  Laterality: N/A;  swan only  . RIGHT/LEFT HEART CATH AND CORONARY ANGIOGRAPHY N/A 01/26/2020   Procedure: RIGHT/LEFT HEART CATH AND CORONARY ANGIOGRAPHY;  Surgeon: Dolores Patty, MD;  [facility];  Service: Cardiovascular;  Laterality: N/A;  . TEE WITHOUT CARDIOVERSION N/A 01/30/2020   Procedure: TRANSESOPHAGEAL ECHOCARDIOGRAM (TEE);  Surgeon: Corliss Skains, MD;  Location: [facility] OR;  Service: Open Heart Surgery;  Laterality: N/A;  . TEE WITHOUT CARDIOVERSION N/A 02/06/2020   Procedure: TRANSESOPHAGEAL ECHOCARDIOGRAM (TEE);  Surgeon: Dolores Patty, MD;  Location: [facility] ENDOSCOPY;  Service: Cardiovascular;  Laterality: N/A;    Social History   Socioeconomic History  . Marital status: Single    Spouse name: Not on file  . Number of children: Not on file  . Years of education: Not on file  . Highest education level: Not on file  Occupational History  . Not on file  Tobacco Use  . Smoking status: Never Smoker  . Smokeless tobacco: Never Used  Substance and Sexual Activity  . Alcohol use: Yes  . Drug use: Not on file  . Sexual activity: Not on file  Other Topics Concern  . Not on file  Social History Narrative  . Not  on file   Social Determinants of Health   Financial Resource Strain: Low Risk   . Difficulty of Paying Living Expenses: Not very hard  Food Insecurity: No Food Insecurity  . Worried About Programme researcher, broadcasting/film/video in the Last Year: Never true  . Ran Out of Food in the Last Year: Never true  Transportation Needs: No Transportation Needs  . Lack of Transportation (Medical): No  . Lack of  Transportation (Non-Medical): No  Physical Activity:   . Days of Exercise per Week: Not on file  . Minutes of Exercise per Session: Not on file  Stress:   . Feeling of Stress : Not on file  Social Connections:   . Frequency of Communication with Friends and Family: Not on file  . Frequency of Social Gatherings with Friends and Family: Not on file  . Attends Religious Services: Not on file  . Active Member of Clubs or Organizations: Not on file  . Attends Banker Meetings: Not on file  . Marital Status: Not on file  Intimate Partner Violence:   . Fear of Current or Ex-Partner: Not on file  . Emotionally Abused: Not on file  . Physically Abused: Not on file  . Sexually Abused: Not on file    Family History  Problem Relation Age of Onset  . Gout Sister     Current Outpatient Medications  Medication Sig Dispense Refill  . aspirin 81 MG chewable tablet Chew 1 tablet (81 mg total) by mouth daily.    Marland Kitchen atorvastatin (LIPITOR) 40 MG tablet Take 1 tablet (40 mg total) by mouth daily. 90 tablet 3  . furosemide (LASIX) 40 MG tablet Take 1 tablet (40 mg total) by mouth daily. 30 tablet 5  . hydrocerin (EUCERIN) CREA Apply 1 application topically 2 (two) times daily. 228 g 0  . metoprolol succinate (TOPROL XL) 25 MG 24 hr tablet Take 1 tablet (25 mg total) by mouth daily. 30 tablet 11  . sacubitril-valsartan (ENTRESTO) 49-51 MG Take 1 tablet by mouth 2 (two) times daily. 180 tablet 3  . spironolactone (ALDACTONE) 25 MG tablet Take 0.5 tablets (12.5 mg total) by mouth daily. 15 tablet 6  . triamcinolone cream (KENALOG) 0.1 % Apply 1 application topically 2 (two) times daily. 45 g 0   No current facility-administered medications for this visit.    Allergies  Allergen Reactions  . Other Other (See Comments)    No blood products    REVIEW OF SYSTEMS (negative unless checked):   Cardiac:  []  Chest pain or chest pressure? []  Shortness of breath upon activity? []  Shortness  of breath when lying flat? []  Irregular heart rhythm?  Vascular:  []  Pain in calf, thigh, or hip brought on by walking? []  Pain in feet at night that wakes you up from your sleep? []  Blood clot in your veins? []  Leg swelling?  Pulmonary:  []  Oxygen at home? []  Productive cough? []  Wheezing?  Neurologic:  []  Sudden weakness in arms or legs? []  Sudden numbness in arms or legs? []  Sudden onset of difficult speaking or slurred speech? []  Temporary loss of vision in one eye? []  Problems with dizziness?  Gastrointestinal:  []  Blood in stool? []  Vomited blood?  Genitourinary:  []  Burning when urinating? []  Blood in urine?  Psychiatric:  []  Major depression  Hematologic:  []  Bleeding problems? []  Problems with blood clotting?  Dermatologic:  []  Rashes or ulcers?  Constitutional:  []  Fever or chills?  Ear/Nose/Throat:  []   Change in hearing? []  Nose bleeds? []  Sore throat?  Musculoskeletal:  []  Back pain? []  Joint pain? []  Muscle pain?   Physical Examination     Vitals:   05/23/20 1342  BP: 98/66  Pulse: 81  Resp: 20  Temp: 97.9 F (36.6 C)  TempSrc: Temporal  SpO2: 97%  Weight: 222 lb (100.7 kg)  Height: 6' (1.829 m)   Body mass index is 30.11 kg/m.  General:  WDWN in NAD; vital signs documented above Gait: Not observed HENT: WNL, normocephalic Pulmonary: normal non-labored breathing , without Rales, rhonchi,  wheezing Cardiac: regular HR, without  Murmurs without carotid bruit Abdomen: soft, NT, no masses Vascular Exam/Pulses:  Right Left  Radial 2+ (normal) 2+ (normal)  Femoral 2+ (normal) 2+ (normal)  Popliteal 2+ (normal) 2+ (normal)  DP 2+ (normal) 2+ (normal)  PT 2+ (normal) 2+ (normal)   Extremities: without varicose veins, without reticular veins, with mild bilateral edema at ankles and dorsum of feet, without stasis pigmentation, without lipodermatosclerosis, without ulcers. Bilateral rubor of distal legs and feet. Feet warm. Motor  and sensory intaact Musculoskeletal: no muscle wasting or atrophy  Neurologic: A&O X 3;  No focal weakness or paresthesias are detected Psychiatric:  The pt has Normal affect.  Non-invasive Vascular Imaging   BLE Venous Insufficiency Duplex (05/23/20):   RLE:   No DVT and SVT  GSV reflux SFJ to proximal calf  GSV diameter 0.39-0.74  SSV reflux at [facility]  CFV deep venous reflux   LLE:  No DVT and SVT  GSV reflux SFJ to proximal calf  GSV diameter 0.30-0.54  SSV reflux at [facility] and proximal calf  No deep venous reflux   Medical Decision Making   Andric Kerce is a 64 y.o. male who presents with: BLE chronic venous insufficiency with swelling and rubor. His venous reflux duplex shows bilateral superficial venous reflux. He does have some deep reflux of the right lower extremity. His GSV bilaterally is of adequate diameter for ablation.Based on the patient's history and examination, I recommend continued elevation, compression stockings, and exercise therapy   I discussed with the patient the use of her 20-30 mm thigh high compression stockings and need for 3 month trial of such. He has been measured for these at today's visit  The patient will follow up in 3 months with Dr. [facility] or Dr. 10-26-2005  Thank you for allowing [facility] to participate in this patient's care.   Mina Marble, PA-C [facility] Office: [phone]  05/23/2020, 2:22 PM  Clinic MD: Dr. [place]

## 2020-05-24 ENCOUNTER — Encounter: Payer: Self-pay | Admitting: Internal Medicine

## 2020-05-24 ENCOUNTER — Ambulatory Visit (HOSPITAL_BASED_OUTPATIENT_CLINIC_OR_DEPARTMENT_OTHER): Payer: Medicaid Other | Admitting: Pharmacist

## 2020-05-24 ENCOUNTER — Ambulatory Visit: Payer: Medicaid Other | Attending: Internal Medicine | Admitting: Internal Medicine

## 2020-05-24 VITALS — BP 102/67 | HR 87 | Temp 98.1°F | Resp 16 | Wt 219.8 lb

## 2020-05-24 DIAGNOSIS — Z23 Encounter for immunization: Secondary | ICD-10-CM

## 2020-05-24 DIAGNOSIS — Z8679 Personal history of other diseases of the circulatory system: Secondary | ICD-10-CM | POA: Diagnosis not present

## 2020-05-24 DIAGNOSIS — Z1211 Encounter for screening for malignant neoplasm of colon: Secondary | ICD-10-CM

## 2020-05-24 DIAGNOSIS — Z7189 Other specified counseling: Secondary | ICD-10-CM

## 2020-05-24 DIAGNOSIS — I878 Other specified disorders of veins: Secondary | ICD-10-CM

## 2020-05-24 MED ORDER — TRIAMCINOLONE ACETONIDE 0.1 % EX CREA: 1.0000 "application " | TOPICAL_CREAM | Freq: Two times a day (BID) | CUTANEOUS | 2 refills | Status: DC

## 2020-05-24 NOTE — Progress Notes (Signed)
Patient presents for vaccination against influenza per orders of Dr. Johnson. Consent given. Counseling provided. No contraindications exists. Vaccine administered without incident.  ° °Luke Van Ausdall, PharmD, CPP °Clinical Pharmacist °[facility] °[phone] ° °

## 2020-05-24 NOTE — Progress Notes (Signed)
Patient ID: Corey Brady, male    DOB: 1956/06/21  MRN: [id number]  CC: Follow-up (4 week )   Subjective: Corey Brady is a 64 y.o. male who presents for f/u on legs His concerns today include:  CAD s/p CABG 12/2019, chronic systolic CHF, HL, hx of atrial flutter/fib (s/p ablation 02/2020.  Experienced junctional rhythm post ablation.  EP recommends avoid rate control meds), anemia   HM: Needs COVID vaccine.  He was hesitant to get it because it is new and not sure how it would affect him.  Agreeable for flu shot. Due for colon CA screening.  He has never had any type of colon cancer screening.  He has never had brother had a few polyps removed  I saw him last month.  He had completed antibiotics for cellulitis of the legs.  He has venous stasis changes of both lower extremities.  He had been using compression stockings.  I would prescribe some triamcinolone cream to help decrease the exudative rash.  I referred him to vein and vascular.  He was seen by the PA yesterday.  Had venous reflux study done.  He was found to have some reflux in the GSV bilaterally.  Recommendation was for him to continue using compression stockings that are thigh-high, continue elevation and exercise therapy. -Patient reports that the swelling in the legs have significantly decreased with the compression stockings.  The redness and exudate also decreased with the triamcinolone cream.  He has been out of the triamcinolone cream for 2 weeks.  He is requesting a refill.  A. fib/atrial flutter: He underwent ablation in June of this year.  Since last visit with me he is seen in the EP specialist in follow-up.  Eliquis was discontinued.  He was started on low-dose of Toprol.  He has also been started on Entresto.  Plan is for follow-up echo in about 3 months to see whether his ejection fraction improved.  Today patient denies any chest pains or shortness of breath.  No palpitations.  Patient Active Problem List   Diagnosis Date  Noted  . Acute on chronic systolic (congestive) heart failure (HCC) 03/12/2020  . S/P CABG (coronary artery bypass graft) 01/30/2020  . Acute heart failure (HCC) 01/23/2020     Current Outpatient Medications on File Prior to Visit  Medication Sig Dispense Refill  . aspirin 81 MG chewable tablet Chew 1 tablet (81 mg total) by mouth daily.    Marland Kitchen atorvastatin (LIPITOR) 40 MG tablet Take 1 tablet (40 mg total) by mouth daily. 90 tablet 3  . furosemide (LASIX) 40 MG tablet Take 1 tablet (40 mg total) by mouth daily. 30 tablet 5  . hydrocerin (EUCERIN) CREA Apply 1 application topically 2 (two) times daily. 228 g 0  . metoprolol succinate (TOPROL XL) 25 MG 24 hr tablet Take 1 tablet (25 mg total) by mouth daily. 30 tablet 11  . sacubitril-valsartan (ENTRESTO) 49-51 MG Take 1 tablet by mouth 2 (two) times daily. 180 tablet 3  . spironolactone (ALDACTONE) 25 MG tablet Take 0.5 tablets (12.5 mg total) by mouth daily. 15 tablet 6   No current facility-administered medications on file prior to visit.    Allergies  Allergen Reactions  . Other Other (See Comments)    No blood products    Social History   Socioeconomic History  . Marital status: Single    Spouse name: Not on file  . Number of children: Not on file  . Years of education:  Not on file  . Highest education level: Not on file  Occupational History  . Not on file  Tobacco Use  . Smoking status: Never Smoker  . Smokeless tobacco: Never Used  Substance and Sexual Activity  . Alcohol use: Yes  . Drug use: Not on file  . Sexual activity: Not on file  Other Topics Concern  . Not on file  Social History Narrative  . Not on file   Social Determinants of Health   Financial Resource Strain: Low Risk   . Difficulty of Paying Living Expenses: Not very hard  Food Insecurity: No Food Insecurity  . Worried About Programme researcher, broadcasting/film/video in the Last Year: Never true  . Ran Out of Food in the Last Year: Never true  Transportation Needs:  No Transportation Needs  . Lack of Transportation (Medical): No  . Lack of Transportation (Non-Medical): No  Physical Activity:   . Days of Exercise per Week: Not on file  . Minutes of Exercise per Session: Not on file  Stress:   . Feeling of Stress : Not on file  Social Connections:   . Frequency of Communication with Friends and Family: Not on file  . Frequency of Social Gatherings with Friends and Family: Not on file  . Attends Religious Services: Not on file  . Active Member of Clubs or Organizations: Not on file  . Attends Banker Meetings: Not on file  . Marital Status: Not on file  Intimate Partner Violence:   . Fear of Current or Ex-Partner: Not on file  . Emotionally Abused: Not on file  . Physically Abused: Not on file  . Sexually Abused: Not on file    Family History  Problem Relation Age of Onset  . Gout Sister     Past Surgical History:  Procedure Laterality Date  . A-FLUTTER ABLATION N/A 03/15/2020   Procedure: A-FLUTTER ABLATION;  Surgeon: Regan Lemming, MD;  Location: [facility] INVASIVE CV LAB;  Service: Cardiovascular;  Laterality: N/A;  . CARDIOVERSION N/A 02/06/2020   Procedure: CARDIOVERSION;  Surgeon: Dolores Patty, MD;  Location: [facility] ENDOSCOPY;  Service: Cardiovascular;  Laterality: N/A;  . CORONARY ARTERY BYPASS GRAFT N/A 01/30/2020   Procedure: OFF PUMP CORONARY ARTERY BYPASS GRAFTING (CABG) TIMES ONE;  Surgeon: Corliss Skains, MD;  Location: [facility] OR;  Service: Open Heart Surgery;  Laterality: N/A;  swan only  . RIGHT/LEFT HEART CATH AND CORONARY ANGIOGRAPHY N/A 01/26/2020   Procedure: RIGHT/LEFT HEART CATH AND CORONARY ANGIOGRAPHY;  Surgeon: Dolores Patty, MD;  Location: [facility] INVASIVE CV LAB;  Service: Cardiovascular;  Laterality: N/A;  . TEE WITHOUT CARDIOVERSION N/A 01/30/2020   Procedure: TRANSESOPHAGEAL ECHOCARDIOGRAM (TEE);  Surgeon: Corliss Skains, MD;  Location: [facility] OR;  Service: Open Heart Surgery;  Laterality: N/A;  .  TEE WITHOUT CARDIOVERSION N/A 02/06/2020   Procedure: TRANSESOPHAGEAL ECHOCARDIOGRAM (TEE);  Surgeon: Dolores Patty, MD;  Location: [facility] ENDOSCOPY;  Service: Cardiovascular;  Laterality: N/A;    ROS: Review of Systems Negative except as stated above  PHYSICAL EXAM: BP 102/67   Pulse 87   Temp 98.1 F (36.7 C)   Resp 16   Wt 219 lb 12.8 oz (99.7 kg)   SpO2 98%   BMI 29.81 kg/m   Physical Exam  General appearance - alert, well appearing, and in no distress Mental status - normal mood, behavior, speech, dress, motor activity, and thought processes Chest - clear to auscultation, no wheezes, rales or rhonchi, symmetric air entry  Heart - normal rate, regular rhythm, normal S1, S2, no murmurs, rubs, clicks or gallops Extremities -trace lower extremity edema.  Mild erythema of both legs that improves with elevation. CMP Latest Ref Rng & Units 04/08/2020 03/29/2020 03/18/2020  Glucose 70 - 99 mg/dL 814(G) 94 86  BUN 8 - 23 mg/dL 17 16 81(E)  Creatinine 0.61 - 1.24 mg/dL 5.63 1.49 7.02  Sodium 135 - 145 mmol/L 140 139 138  Potassium 3.5 - 5.1 mmol/L 4.1 4.1 4.8  Chloride 98 - 111 mmol/L 104 105 102  CO2 22 - 32 mmol/L 25 26 28   Calcium 8.9 - 10.3 mg/dL 9.4 9.1 9.2  Total Protein 6.5 - 8.1 g/dL - - -  Total Bilirubin 0.3 - 1.2 mg/dL - - -  Alkaline Phos 38 - 126 U/L - - -  AST 15 - 41 U/L - - -  ALT 0 - 44 U/L - - -   Lipid Panel     Component Value Date/Time   CHOL 116 01/23/2020 1700   TRIG 56 01/23/2020 1700   HDL 36 (L) 01/23/2020 1700   CHOLHDL 3.2 01/23/2020 1700   VLDL 11 01/23/2020 1700   LDLCALC 69 01/23/2020 1700    CBC    Component Value Date/Time   WBC 7.1 03/29/2020 1419   RBC 4.48 03/29/2020 1419   HGB 13.1 03/29/2020 1419   HGB 13.1 02/16/2020 1615   HCT 40.4 03/29/2020 1419   HCT 38.7 02/16/2020 1615   PLT 215 03/29/2020 1419   PLT 349 02/16/2020 1615   MCV 90.2 03/29/2020 1419   MCV 89 02/16/2020 1615   MCH 29.2 03/29/2020 1419   MCHC 32.4  03/29/2020 1419   RDW 14.0 03/29/2020 1419   RDW 14.3 02/16/2020 1615    ASSESSMENT AND PLAN: 1. Venous stasis of both lower extremities Appreciate vein and vascular input.  Patient will continue to try to keep the legs elevated, wear his compression stockings and try to exercise.  He has a follow-up appointment with them in December with Dr. January.  Refill given on triamcinolone cream - triamcinolone cream (KENALOG) 0.1 %; Apply 1 application topically 2 (two) times daily.  Dispense: 45 g; Refill: 2  2. History of atrial flutter Followed by cardiology.  He is no longer on Eliquis.  3. Need for influenza vaccination Given today.  4. Educated about COVID-19 virus infection Strongly advised patient to get the COVID-19 vaccine.  Discussed with him that the vaccines are safe and effective.  5. Screening for colon cancer Discussed screening methods including noninvasive methods like fit test and Cologuard versus colonoscopy.  Patient states he will think about it and will let me know on subsequent visit.  Patient was given the opportunity to ask questions.  Patient verbalized understanding of the plan and was able to repeat key elements of the plan.   No orders of the defined types were placed in this encounter.    Requested Prescriptions   Signed Prescriptions Disp Refills  . triamcinolone cream (KENALOG) 0.1 % 45 g 2    Sig: Apply 1 application topically 2 (two) times daily.    Return in about 4 months (around 09/23/2020).  09/25/2020, MD, FACP

## 2020-05-27 ENCOUNTER — Other Ambulatory Visit: Payer: Self-pay

## 2020-05-27 ENCOUNTER — Ambulatory Visit (HOSPITAL_COMMUNITY)
Admission: RE | Admit: 2020-05-27 | Discharge: 2020-05-27 | Disposition: A | Discharge status: Home / Self Care | Payer: Medicaid Other | Source: Ambulatory Visit | Attending: Cardiology | Admitting: Cardiology

## 2020-05-27 ENCOUNTER — Encounter (HOSPITAL_COMMUNITY): Payer: Self-pay

## 2020-05-27 VITALS — BP 126/80 | HR 76 | Wt 222.4 lb

## 2020-05-27 DIAGNOSIS — I5022 Chronic systolic (congestive) heart failure: Secondary | ICD-10-CM | POA: Insufficient documentation

## 2020-05-27 DIAGNOSIS — Z8679 Personal history of other diseases of the circulatory system: Secondary | ICD-10-CM | POA: Diagnosis not present

## 2020-05-27 DIAGNOSIS — I2510 Atherosclerotic heart disease of native coronary artery without angina pectoris: Secondary | ICD-10-CM | POA: Diagnosis not present

## 2020-05-27 DIAGNOSIS — Z79899 Other long term (current) drug therapy: Secondary | ICD-10-CM | POA: Diagnosis not present

## 2020-05-27 DIAGNOSIS — Z951 Presence of aortocoronary bypass graft: Secondary | ICD-10-CM | POA: Insufficient documentation

## 2020-05-27 DIAGNOSIS — Z7982 Long term (current) use of aspirin: Secondary | ICD-10-CM | POA: Diagnosis not present

## 2020-05-27 LAB — BASIC METABOLIC PANEL
Anion gap: 9 (ref 5–15)
BUN: 18 mg/dL (ref 8–23)
CO2: 26 mmol/L (ref 22–32)
Calcium: 9 mg/dL (ref 8.9–10.3)
Chloride: 104 mmol/L (ref 98–111)
Creatinine, Ser: 1.17 mg/dL (ref 0.61–1.24)
GFR calc Af Amer: >60 mL/min (ref >60)
GFR calc non Af Amer: >60 mL/min (ref >60)
Glucose, Bld: 117 mg/dL — ABNORMAL HIGH (ref 70–99)
Potassium: 4.5 mmol/L (ref 3.5–5.1)
Sodium: 139 mmol/L (ref 135–145)

## 2020-05-27 MED ORDER — SACUBITRIL-VALSARTAN 97-103 MG PO TABS: 1.0000 | ORAL_TABLET | Freq: Two times a day (BID) | ORAL | 6 refills | Status: DC

## 2020-05-27 MED ORDER — DAPAGLIFLOZIN PROPANEDIOL 10 MG PO TABS
10.0000 mg | ORAL_TABLET | Freq: Every day | ORAL | 6 refills | Status: DC
Start: 2020-05-27 — End: 2020-06-25

## 2020-05-27 NOTE — Progress Notes (Signed)
Advanced Heart Failure Clinic Note   Referring Physician: PCP: Marcine Matar, MD PCP-Cardiologist:  [name]   HPI:  64 y/o male w/ CAD, severe systolic HF and PAFL. Recently underwent CABG 5/21. Post-op course c/b low output and PAFL requiring several DC-CV. Was placed on amiodarone and dose reduced due to bradycardia.   Readmitted back to hospital from [facility] on 6/15 for a/c CHF w/ volume overload, recurrent ALF and bilateral  LE cellulitis.    He was loaded on IV amiodarone, started on IV lasix for CHF and started on IV vanc for cellulitis. No signs of systemic infection. WBC remained normal and he remained AF. Abx later changed to PO doxycycline. Had good diuresis w/ IV Lasix and changed back to PO diuretics. EP was consulted for AFL and he underwent  AFL ablation6/19. Post ablation had periods of junctional rhythm. Dig and amio held and junctional rhythm resolved. EP recommended avoidance of rate control medications. No indication for PPM at this time. Was discharged home on 6/21. D/c wt was 215 lb. He is being followed by paramedicine.   He had EP f/u w/ [name] 7/29 and was in NSR. Eliquis was discontinued and low dose metoprolol was added.   He presents to clinic today for f/u for systolic HF. At his last f/u visit, [name] was further increased to 49-51. He reports that he has done well. Feels great. No exertional dyspnea w/ ADLs. Denies orthopnea/PND. No LEE. Denies CP. No palpitations. EKG today shows NSR w/ PACs. HR 74 bpm. BP stable, 126/80. Reports full med compliance. No side effects.     Review of Systems: [y] = yes, [ ]  = no   General: Weight gain [ ] ; Weight loss [ ] ; Anorexia [ ] ; Fatigue [ ] ; Fever [ ] ; Chills [ ] ; Weakness [ ]   Cardiac: Chest pain/pressure [ ] ; Resting SOB [ ] ; Exertional SOB [ ] ; Orthopnea [ ] ; Pedal Edema [ ] ; Palpitations [ ] ; Syncope [ ] ; Presyncope [ ] ; Paroxysmal nocturnal dyspnea[ ]   Pulmonary: Cough [ ] ; Wheezing[ ] ; Hemoptysis[  ]; Sputum [ ] ; Snoring [ ]   GI: Vomiting[ ] ; Dysphagia[ ] ; Melena[ ] ; Hematochezia [ ] ; Heartburn[ ] ; Abdominal pain [ ] ; Constipation [ ] ; Diarrhea [ ] ; BRBPR [ ]   GU: Hematuria[ ] ; Dysuria [ ] ; Nocturia[ ]   Vascular: Pain in legs with walking [ ] ; Pain in feet with lying flat [ ] ; Non-healing sores [ ] ; Stroke [ ] ; TIA [ ] ; Slurred speech [ ] ;  Neuro: Headaches[ ] ; Vertigo[ ] ; Seizures[ ] ; Paresthesias[ ] ;Blurred vision [ ] ; Diplopia [ ] ; Vision changes [ ]   Ortho/Skin: Arthritis [ ] ; Joint pain [ ] ; Muscle pain [ ] ; Joint swelling [ ] ; Back Pain [ ] ; Rash [ ]   Psych: Depression[ ] ; Anxiety[ ]   Heme: Bleeding problems [ ] ; Clotting disorders [ ] ; Anemia [ ]   Endocrine: Diabetes [ ] ; Thyroid dysfunction[ ]    Past Medical History:  Diagnosis Date  . CHF (congestive heart failure) (HCC)   . Gout   . No blood products 01/27/2020    Current Outpatient Medications  Medication Sig Dispense Refill  . aspirin 81 MG chewable tablet Chew 1 tablet (81 mg total) by mouth daily.    . furosemide (LASIX) 20 MG tablet Take 40 mg by mouth daily.     . hydrocerin (EUCERIN) CREA Apply 1 application topically 2 (two) times daily. 228 g 0  . metoprolol succinate (TOPROL XL) 25 MG 24 hr tablet Take  1 tablet (25 mg total) by mouth daily. 30 tablet 11  . sacubitril-valsartan (ENTRESTO) 49-51 MG Take 1 tablet by mouth 2 (two) times daily. 180 tablet 3  . spironolactone (ALDACTONE) 25 MG tablet Take 0.5 tablets (12.5 mg total) by mouth daily. 15 tablet 6  . triamcinolone cream (KENALOG) 0.1 % Apply 1 application topically 2 (two) times daily. 45 g 2   No current facility-administered medications for this encounter.    Allergies  Allergen Reactions  . Other Other (See Comments)    No blood products      Social History   Socioeconomic History  . Marital status: Single    Spouse name: Not on file  . Number of children: Not on file  . Years of education: Not on file  . Highest education level: Not on  file  Occupational History  . Not on file  Tobacco Use  . Smoking status: Never Smoker  . Smokeless tobacco: Never Used  Substance and Sexual Activity  . Alcohol use: Yes  . Drug use: Not on file  . Sexual activity: Not on file  Other Topics Concern  . Not on file  Social History Narrative  . Not on file   Social Determinants of Health   Financial Resource Strain: Low Risk   . Difficulty of Paying Living Expenses: Not very hard  Food Insecurity: No Food Insecurity  . Worried About Programme researcher, broadcasting/film/video in the Last Year: Never true  . Ran Out of Food in the Last Year: Never true  Transportation Needs: No Transportation Needs  . Lack of Transportation (Medical): No  . Lack of Transportation (Non-Medical): No  Physical Activity:   . Days of Exercise per Week: Not on file  . Minutes of Exercise per Session: Not on file  Stress:   . Feeling of Stress : Not on file  Social Connections:   . Frequency of Communication with Friends and Family: Not on file  . Frequency of Social Gatherings with Friends and Family: Not on file  . Attends Religious Services: Not on file  . Active Member of Clubs or Organizations: Not on file  . Attends Banker Meetings: Not on file  . Marital Status: Not on file  Intimate Partner Violence:   . Fear of Current or Ex-Partner: Not on file  . Emotionally Abused: Not on file  . Physically Abused: Not on file  . Sexually Abused: Not on file      Family History  Problem Relation Age of Onset  . Gout Sister     Vitals:   05/27/20 1210  BP: 126/80  Pulse: 76  SpO2: 98%  Weight: 100.9 kg (222 lb 6.4 oz)   PHYSICAL EXAM: General:  Well appearing. No respiratory difficulty HEENT: normal Neck: supple. no JVD. Carotids 2+ bilat; no bruits. No lymphadenopathy or thyromegaly appreciated. Cor: PMI nondisplaced. Regular rate & rhythm. No rubs, gallops or murmurs. Lungs: clear Abdomen: soft, nontender, nondistended. No hepatosplenomegaly.  No bruits or masses. Good bowel sounds. Extremities: no cyanosis, clubbing, rash, edema Neuro: alert & oriented x 3, cranial nerves grossly intact. moves all 4 extremities w/o difficulty. Affect pleasant.  ECG: NSR 74 bpm   ASSESSMENT & PLAN:  1. Chronic Systolic Heart Failure - ECHO 1/88 EF 15% with severe global HK, and moderate RV dysfunction. Etiology possible ETOH versus HTN (denies severe ETOH use). TSH ok. HIV NR  - 4/28 LHC with severe 1v CAD. RHC with preserved cardiac output.  -  S/p CABG x 1 w/ LIMA-LAD 01/30/20 - Echo repeated 6/21, LVEF 25-30%, RV mildly reduced  - NYHAII - Volume status ok, wt up 3 lb from previous visit  - Increase Entresto to 97-103 mg bid - Start Farxiga 10 mg daily (hgb A1c 6.3) - Stop daily Lasix  - Continue spiro 25 mg daily  - Continue low dose  blocker (Toprol XL 25 mg daily, started by EP) - No digoxin given recent junctional bradycardia  - check BMP today and again in 7 days  - Plan repeat echo in September. Refer back to EP for ICD if EF remains <35% - continue w/ paramedicine   2. CAD:  - LHC 12/2019 with severe 1VCAD - S/p CABG x 1 w/ LIMA-LAD 01/30/20 - stable w/o anginal symptoms  - on ASA 81 + atorvastatin 40 mg - continue Toprol XL 25 mg daily    3. H/O Arial Flutter  - s/p ablation 6/18 by [name]. Periods of junctional rhythm post ablation - off amio and dig -> improved. Tolerating low dose  blocker ok. - Discussed w/ EP. No role for PPM/ICD - Maintaining NSR on EKG today. HR stable  - Eliquis discontinued by [name] 7/21.    F/u in 4 weeks and repeat echo   Robbie Lis, PA-C 05/27/20

## 2020-05-27 NOTE — Patient Instructions (Signed)
INCREASE Entresto to 97/103 mg, one tb twice a day STOP Lasix START Farxiga 10 mg, one tab daily  Labs today We will only contact you if something comes back abnormal or we need to make some changes. Otherwise no news is good news!  Labs needed in 7-10 days  Your physician recommends that you schedule a follow-up appointment in: 4 week with echo  Your physician has requested that you have an echocardiogram. Echocardiography is a painless test that uses sound waves to create images of your heart. It provides your doctor with information about the size and shape of your heart and how well your heart's chambers and valves are working. This procedure takes approximately one hour. There are no restrictions for this procedure.   If you have any questions or concerns before your next appointment please send [place] a message through [place] or call our office at [phone].    TO LEAVE A MESSAGE FOR THE NURSE SELECT OPTION 2, PLEASE LEAVE A MESSAGE INCLUDING: . YOUR NAME . DATE OF BIRTH . CALL BACK NUMBER . REASON FOR CALL**this is important as we prioritize the call backs  YOU WILL RECEIVE A CALL BACK THE SAME DAY AS LONG AS YOU CALL BEFORE 4:00 PM

## 2020-05-28 ENCOUNTER — Telehealth (HOSPITAL_COMMUNITY): Payer: Self-pay | Admitting: Pharmacy Technician

## 2020-05-28 NOTE — Telephone Encounter (Signed)
Patient Advocate Encounter   Received notification from [facility] that prior authorization for Corey Brady is required.   PA submitted on CoverMyMeds Key : [id number] Status is pending   Will continue to follow.

## 2020-05-29 ENCOUNTER — Telehealth (HOSPITAL_COMMUNITY): Payer: Self-pay

## 2020-05-30 NOTE — Telephone Encounter (Signed)
Advanced Heart Failure Patient Advocate Encounter  Prior Authorization for Marcelline Deist has been approved.    PA# [id number] Approved as of 05/28/20  Patients co-pay is $0  Archer Asa, CPhT

## 2020-05-30 NOTE — Telephone Encounter (Signed)
I called Mr Tagle to schedule an appointment. He stated he would be home Friday so we agreed to meet then at 12:00.   Jacqualine Code, EMT

## 2020-05-31 ENCOUNTER — Other Ambulatory Visit (HOSPITAL_COMMUNITY): Payer: Self-pay

## 2020-05-31 NOTE — Progress Notes (Signed)
Paramedicine Encounter    Patient ID: Corey Brady, male    DOB: 01-13-1956, 64 y.o.   MRN: [id number]   Patient Care Team: Corey Matar, MD as PCP - General (Internal Medicine) Bensimhon, Bevelyn Buckles, MD as PCP - Advanced Heart Failure (Cardiology) Corey Lemming, MD as PCP - Electrophysiology (Cardiology)  Patient Active Problem List   Diagnosis Date Noted  . Venous stasis of both lower extremities 05/24/2020  . History of atrial flutter 05/24/2020  . Acute on chronic systolic (congestive) heart failure (HCC) 03/12/2020  . S/P CABG (coronary artery bypass graft) 01/30/2020    Current Outpatient Medications:  .  aspirin 81 MG chewable tablet, Chew 1 tablet (81 mg total) by mouth daily., Disp:  , Rfl:  .  dapagliflozin propanediol (FARXIGA) 10 MG TABS tablet, Take 1 tablet (10 mg total) by mouth daily before breakfast., Disp: 30 tablet, Rfl: 6 .  hydrocerin (EUCERIN) CREA, Apply 1 application topically 2 (two) times daily., Disp: 228 g, Rfl: 0 .  metoprolol succinate (TOPROL XL) 25 MG 24 hr tablet, Take 1 tablet (25 mg total) by mouth daily., Disp: 30 tablet, Rfl: 11 .  sacubitril-valsartan (ENTRESTO) 97-103 MG, Take 1 tablet by mouth 2 (two) times daily., Disp: 60 tablet, Rfl: 6 .  spironolactone (ALDACTONE) 25 MG tablet, Take 0.5 tablets (12.5 mg total) by mouth daily., Disp: 15 tablet, Rfl: 6 .  triamcinolone cream (KENALOG) 0.1 %, Apply 1 application topically 2 (two) times daily., Disp: 45 g, Rfl: 2 Allergies  Allergen Reactions  . Other Other (See Comments)    No blood products      Social History   Socioeconomic History  . Marital status: Single    Spouse name: Not on file  . Number of children: Not on file  . Years of education: Not on file  . Highest education level: Not on file  Occupational History  . Not on file  Tobacco Use  . Smoking status: Never Smoker  . Smokeless tobacco: Never Used  Substance and Sexual Activity  . Alcohol use: Yes  . Drug  use: Not on file  . Sexual activity: Not on file  Other Topics Concern  . Not on file  Social History Narrative  . Not on file   Social Determinants of Health   Financial Resource Strain: Low Risk   . Difficulty of Paying Living Expenses: Not very hard  Food Insecurity: No Food Insecurity  . Worried About Programme researcher, broadcasting/film/video in the Last Year: Never true  . Ran Out of Food in the Last Year: Never true  Transportation Needs: No Transportation Needs  . Lack of Transportation (Medical): No  . Lack of Transportation (Non-Medical): No  Physical Activity:   . Days of Exercise per Week: Not on file  . Minutes of Exercise per Session: Not on file  Stress:   . Feeling of Stress : Not on file  Social Connections:   . Frequency of Communication with Friends and Family: Not on file  . Frequency of Social Gatherings with Friends and Family: Not on file  . Attends Religious Services: Not on file  . Active Member of Clubs or Organizations: Not on file  . Attends Banker Meetings: Not on file  . Marital Status: Not on file  Intimate Partner Violence:   . Fear of Current or Ex-Partner: Not on file  . Emotionally Abused: Not on file  . Physically Abused: Not on file  . Sexually Abused: Not  on file    Physical Exam      Future Appointments  Date Time Provider Department Center  06/04/2020 12:00 PM [facility] LAB [facility] None  06/25/2020 10:15 AM [facility] ECHO OP 2 [facility]  06/25/2020 11:30 AM [facility] PA/NP [facility] None  08/29/2020  1:20 PM Corey Hint, MD [facility]    BP 106/70 (BP Location: Left Arm, Patient Position: Sitting, Cuff Size: Normal)   Pulse 64   Resp 16   Wt 221 lb 14.4 oz (100.7 kg)   SpO2 98%   BMI 30.10 kg/m   Weight yesterday- 218 lb Last visit weight- 222 lb  Corey Brady was seen at home today and reported feeling well. He denied chest pain, SOB, headache, dizziness, orthopnea, fever or cough over the past week. He stated he has been  compliant with his medications over the past week and his weight has been stable. His medications were verified and his pillbox was refilled. We discussed his comfort managing his medications and he feels good about how to refill his pillbox however now that he has insurance coverage I will wait and ensure he is able to afford his medications before discharging. I expect this will only take a week or so.   Corey Brady, EMT 05/31/20  ACTION: Home visit completed Next visit planned for 1 week

## 2020-06-04 ENCOUNTER — Telehealth (HOSPITAL_COMMUNITY): Payer: Self-pay | Admitting: Pharmacy Technician

## 2020-06-04 ENCOUNTER — Other Ambulatory Visit: Payer: Self-pay

## 2020-06-04 ENCOUNTER — Ambulatory Visit (HOSPITAL_COMMUNITY)
Admission: RE | Admit: 2020-06-04 | Discharge: 2020-06-04 | Disposition: A | Discharge status: Home / Self Care | Payer: Medicaid Other | Source: Ambulatory Visit | Attending: Internal Medicine | Admitting: Internal Medicine

## 2020-06-04 DIAGNOSIS — I5022 Chronic systolic (congestive) heart failure: Secondary | ICD-10-CM | POA: Insufficient documentation

## 2020-06-04 LAB — BASIC METABOLIC PANEL
Anion gap: 4 — ABNORMAL LOW (ref 5–15)
BUN: 18 mg/dL (ref 8–23)
CO2: 26 mmol/L (ref 22–32)
Calcium: 9 mg/dL (ref 8.9–10.3)
Chloride: 108 mmol/L (ref 98–111)
Creatinine, Ser: 1.16 mg/dL (ref 0.61–1.24)
GFR calc Af Amer: >60 mL/min (ref >60)
GFR calc non Af Amer: >60 mL/min (ref >60)
Glucose, Bld: 110 mg/dL — ABNORMAL HIGH (ref 70–99)
Potassium: 4.6 mmol/L (ref 3.5–5.1)
Sodium: 138 mmol/L (ref 135–145)

## 2020-06-04 NOTE — Telephone Encounter (Signed)
Patient Advocate Encounter   Received notification from [facility] that prior authorization for [name] is required.   PA submitted on CoverMyMeds Key [id number] Status is pending   Will continue to follow.

## 2020-06-04 NOTE — Telephone Encounter (Signed)
Advanced Heart Failure Patient Advocate Encounter  Prior Authorization for [name] has been approved.     Effective dates: 06/04/20 through 06/04/21  Archer Asa, CPhT

## 2020-06-06 ENCOUNTER — Encounter (HOSPITAL_COMMUNITY): Payer: Self-pay

## 2020-06-06 ENCOUNTER — Telehealth (HOSPITAL_COMMUNITY): Payer: Self-pay

## 2020-06-06 NOTE — Telephone Encounter (Signed)
Attempted to call patient in regards to Cardiac Rehab - LM on VM Mailed letter 

## 2020-06-07 ENCOUNTER — Telehealth (HOSPITAL_COMMUNITY): Payer: Self-pay

## 2020-06-07 NOTE — Telephone Encounter (Signed)
I called Mr Goodlin to see if he was available for a visit today. He did not answer so I left a message. In the message I advised that I would be gone next week and if he should need anything, he should contact the HF clinic. I will follow up in two weeks.   Jacqualine Code, EMT 06/07/20

## 2020-06-10 ENCOUNTER — Telehealth (HOSPITAL_COMMUNITY): Payer: Self-pay

## 2020-06-10 NOTE — Telephone Encounter (Signed)
Pt brother called stating that pt is interested in the cardiac rehab program. Pt then gets on the phone and stated that he just got new insurance [facility]). I adv pt that I would have to call and get the benefits to see if he has any pt responsibility. I called [facility] Medicaid and they stated that a prior auth is needed before pt can get scheduled. Called Dr. Cliffton Asters office and left a message for the nurse to call back so I can give her the information to receive the auth.

## 2020-06-19 ENCOUNTER — Other Ambulatory Visit (HOSPITAL_COMMUNITY): Payer: Self-pay | Admitting: Cardiology

## 2020-06-24 ENCOUNTER — Telehealth (HOSPITAL_COMMUNITY): Payer: Self-pay | Admitting: Pharmacy Technician

## 2020-06-24 NOTE — Telephone Encounter (Signed)
Received a notification from the patient's pharmacy that a PA was needed for [place]. Upon investigation, the pharmacy was using the wrong card. They were able to get the prescription to go through under the patient's managed medicaid plan with no problem.  Archer Asa, CPhT

## 2020-06-25 ENCOUNTER — Other Ambulatory Visit: Payer: Self-pay

## 2020-06-25 ENCOUNTER — Telehealth (HOSPITAL_COMMUNITY): Payer: Self-pay | Admitting: Cardiology

## 2020-06-25 ENCOUNTER — Ambulatory Visit (HOSPITAL_BASED_OUTPATIENT_CLINIC_OR_DEPARTMENT_OTHER)
Admission: RE | Admit: 2020-06-25 | Discharge: 2020-06-25 | Disposition: A | Discharge status: Home / Self Care | Payer: Medicaid Other | Source: Ambulatory Visit | Attending: Cardiology | Admitting: Cardiology

## 2020-06-25 ENCOUNTER — Encounter (HOSPITAL_COMMUNITY): Payer: Self-pay

## 2020-06-25 ENCOUNTER — Ambulatory Visit (HOSPITAL_COMMUNITY)
Admission: RE | Admit: 2020-06-25 | Discharge: 2020-06-25 | Disposition: A | Discharge status: Home / Self Care | Payer: Medicaid Other | Source: Ambulatory Visit | Attending: Cardiology | Admitting: Cardiology

## 2020-06-25 VITALS — BP 102/74 | HR 86 | Wt 220.2 lb

## 2020-06-25 DIAGNOSIS — Z951 Presence of aortocoronary bypass graft: Secondary | ICD-10-CM | POA: Diagnosis not present

## 2020-06-25 DIAGNOSIS — I5022 Chronic systolic (congestive) heart failure: Secondary | ICD-10-CM

## 2020-06-25 DIAGNOSIS — Z7984 Long term (current) use of oral hypoglycemic drugs: Secondary | ICD-10-CM | POA: Insufficient documentation

## 2020-06-25 DIAGNOSIS — Z79899 Other long term (current) drug therapy: Secondary | ICD-10-CM | POA: Diagnosis not present

## 2020-06-25 DIAGNOSIS — Z7982 Long term (current) use of aspirin: Secondary | ICD-10-CM | POA: Insufficient documentation

## 2020-06-25 DIAGNOSIS — R001 Bradycardia, unspecified: Secondary | ICD-10-CM | POA: Diagnosis not present

## 2020-06-25 DIAGNOSIS — I2510 Atherosclerotic heart disease of native coronary artery without angina pectoris: Secondary | ICD-10-CM | POA: Diagnosis present

## 2020-06-25 LAB — BASIC METABOLIC PANEL
Anion gap: 8 (ref 5–15)
BUN: 21 mg/dL (ref 8–23)
CO2: 25 mmol/L (ref 22–32)
Calcium: 9.4 mg/dL (ref 8.9–10.3)
Chloride: 105 mmol/L (ref 98–111)
Creatinine, Ser: 1.02 mg/dL (ref 0.61–1.24)
GFR calc Af Amer: >60 mL/min (ref >60)
GFR calc non Af Amer: >60 mL/min (ref >60)
Glucose, Bld: 103 mg/dL — ABNORMAL HIGH (ref 70–99)
Potassium: 4.2 mmol/L (ref 3.5–5.1)
Sodium: 138 mmol/L (ref 135–145)

## 2020-06-25 LAB — LIPID PANEL
Cholesterol: 169 mg/dL (ref 0–200)
HDL: 46 mg/dL (ref >40)
LDL Cholesterol: 107 mg/dL — ABNORMAL HIGH (ref 0–99)
Total CHOL/HDL Ratio: 3.7 RATIO
Triglycerides: 82 mg/dL (ref <150)
VLDL: 16 mg/dL (ref 0–40)

## 2020-06-25 LAB — ECHOCARDIOGRAM COMPLETE
Area-P 1/2: 1.66 cm2
S' Lateral: 3.2 cm

## 2020-06-25 MED ORDER — DAPAGLIFLOZIN PROPANEDIOL 10 MG PO TABS
10.0000 mg | ORAL_TABLET | Freq: Every day | ORAL | 6 refills | Status: DC
Start: 2020-06-25 — End: 2021-01-20

## 2020-06-25 NOTE — Progress Notes (Signed)
°  Echocardiogram 2D Echocardiogram has been performed.  Celene Skeen 06/25/2020, 10:56 AM

## 2020-06-25 NOTE — Progress Notes (Signed)
Advanced Heart Failure Clinic Note   Referring Physician: PCP: Marcine Matar, MD PCP-Cardiologist:  [name]   HPI:  65 y/o male w/ CAD, severe systolic HF and PAFL. Recently underwent CABG 5/21. Post-op course c/b low output and PAFL requiring several DC-CV. Was placed on amiodarone and dose reduced due to bradycardia.   Readmitted back to hospital from [facility] on 6/15 for a/c CHF w/ volume overload, recurrent ALF and bilateral  LE cellulitis.    He was loaded on IV amiodarone, started on IV lasix for CHF and started on IV vanc for cellulitis. No signs of systemic infection. WBC remained normal and he remained AF. Abx later changed to PO doxycycline. Had good diuresis w/ IV Lasix and changed back to PO diuretics. EP was consulted for AFL and he underwent  AFL ablation6/19. Post ablation had periods of junctional rhythm. Dig and amio held and junctional rhythm resolved. EP recommended avoidance of rate control medications. No indication for PPM at this time. Was discharged home on 6/21. D/c wt was 215 lb. He is being followed by paramedicine.   He had EP f/u w/ [name] 7/29 and was in NSR. Eliquis was discontinued and low dose metoprolol was added. He has done well since and has been followed closely and meds optimized.   He returns today for f/u. Echo repeated today, interpretation pending. He continues to do well. Denies dyspnea w/ ADLs. No orthopnea, PND or LEE. Denies CP. Fully compliant with meds. BP is a bit soft, but no orthostatic symptoms. EKG shows NSR, 87 bpm. No palpitations.   Review of systems complete and found to be negative unless listed in HPI.      Past Medical History:  Diagnosis Date  . CHF (congestive heart failure) (HCC)   . Gout   . No blood products 01/27/2020    Current Outpatient Medications  Medication Sig Dispense Refill  . aspirin 81 MG chewable tablet Chew 1 tablet (81 mg total) by mouth daily.    . dapagliflozin propanediol (FARXIGA) 10  MG TABS tablet Take 1 tablet (10 mg total) by mouth daily before breakfast. 30 tablet 6  . hydrocerin (EUCERIN) CREA Apply 1 application topically 2 (two) times daily. 228 g 0  . metoprolol succinate (TOPROL XL) 25 MG 24 hr tablet Take 1 tablet (25 mg total) by mouth daily. 30 tablet 11  . sacubitril-valsartan (ENTRESTO) 97-103 MG Take 1 tablet by mouth 2 (two) times daily. 60 tablet 6  . spironolactone (ALDACTONE) 25 MG tablet Take 0.5 tablets (12.5 mg total) by mouth daily. 15 tablet 6  . triamcinolone cream (KENALOG) 0.1 % Apply 1 application topically 2 (two) times daily. 45 g 2   No current facility-administered medications for this encounter.    Allergies  Allergen Reactions  . Other Other (See Comments)    No blood products      Social History   Socioeconomic History  . Marital status: Single    Spouse name: Not on file  . Number of children: Not on file  . Years of education: Not on file  . Highest education level: Not on file  Occupational History  . Not on file  Tobacco Use  . Smoking status: Never Smoker  . Smokeless tobacco: Never Used  Substance and Sexual Activity  . Alcohol use: Yes  . Drug use: Not on file  . Sexual activity: Not on file  Other Topics Concern  . Not on file  Social History Narrative  .  Not on file   Social Determinants of Health   Financial Resource Strain: Low Risk   . Difficulty of Paying Living Expenses: Not very hard  Food Insecurity: No Food Insecurity  . Worried About Programme researcher, broadcasting/film/video in the Last Year: Never true  . Ran Out of Food in the Last Year: Never true  Transportation Needs: No Transportation Needs  . Lack of Transportation (Medical): No  . Lack of Transportation (Non-Medical): No  Physical Activity:   . Days of Exercise per Week: Not on file  . Minutes of Exercise per Session: Not on file  Stress:   . Feeling of Stress : Not on file  Social Connections:   . Frequency of Communication with Friends and Family: Not  on file  . Frequency of Social Gatherings with Friends and Family: Not on file  . Attends Religious Services: Not on file  . Active Member of Clubs or Organizations: Not on file  . Attends Banker Meetings: Not on file  . Marital Status: Not on file  Intimate Partner Violence:   . Fear of Current or Ex-Partner: Not on file  . Emotionally Abused: Not on file  . Physically Abused: Not on file  . Sexually Abused: Not on file      Family History  Problem Relation Age of Onset  . Gout Sister     Vitals:   06/25/20 1125  BP: 102/74  Pulse: 86  SpO2: 96%  Weight: 99.9 kg (220 lb 3.2 oz)   PHYSICAL EXAM: General:  Well appearing. No respiratory difficulty HEENT: normal Neck: supple. no JVD. Carotids 2+ bilat; no bruits. No lymphadenopathy or thyromegaly appreciated. Cor: PMI nondisplaced. Regular rate & rhythm. No rubs, gallops or murmurs. Lungs: clear Abdomen: soft, nontender, nondistended. No hepatosplenomegaly. No bruits or masses. Good bowel sounds. Extremities: no cyanosis, clubbing, rash, edema Neuro: alert & oriented x 3, cranial nerves grossly intact. moves all 4 extremities w/o difficulty. Affect pleasant.   ECG: NSR 87 bpm   ASSESSMENT & PLAN:  1. Chronic Systolic Heart Failure - ECHO 0/25 EF 15% with severe global HK, and moderate RV dysfunction. Etiology possible ETOH versus HTN (denies severe ETOH use). TSH ok. HIV NR  - 4/28 LHC with severe 1v CAD. RHC with preserved cardiac output.  - S/p CABG x 1 w/ LIMA-LAD 01/30/20 - Echo repeated 6/21, LVEF 25-30%, RV mildly reduced  - NYHAII  - Euvolemic on exam  - Continue Entresto 97-103 mg bid - Continue Farxiga 10 mg daily  - Continue spiro 25 mg daily  - Continue Toprol XL 25 mg daily (started by EP, tolerating ok)  - No digoxin given recent junctional bradycardia  - Continue lasix PRN  - Check BMP today  - Echo repeated today, awaiting MD review. Refer back to EP for ICD if EF remains <35%   2.  CAD:  - LHC 12/2019 with severe 1VCAD - S/p CABG x 1 w/ LIMA-LAD 01/30/20 - stable w/o anginal symptoms  - on ASA 81 mg  - continue Toprol XL 25 mg daily  - check lipid panel today. Add statin if LDL >70    3. H/O Arial Flutter  - s/p ablation 6/18 by [name]. Periods of junctional rhythm post ablation - off amio and dig -> improved. Tolerating low dose  blocker ok. - Maintaining NSR on EKG today. HR stable  - Eliquis discontinued by [name] 7/21.     Repeat echo pending. If EF remains <  35%, will refer to EP for ICD. Otherwise, stable from HF standpoint on optimized meds. Plan RTC in 3 months, or sooner if needed.    Robbie Lis, PA-C 06/25/20

## 2020-06-25 NOTE — Telephone Encounter (Signed)
Patient aware and order placed  

## 2020-06-25 NOTE — Patient Instructions (Signed)
It was great to see you today! No medication changes are needed at this time.   Labs today We will only contact you if something comes back abnormal or we need to make some changes. Otherwise no news is good news!  Your physician recommends that you schedule a follow-up appointment in: 3 months with Dr Bensimhon  If you have any questions or concerns before your next appointment please send us a message through mychart or call our office at [phone].    TO LEAVE A MESSAGE FOR THE NURSE SELECT OPTION 2, PLEASE LEAVE A MESSAGE INCLUDING: . YOUR NAME . DATE OF BIRTH . CALL BACK NUMBER . REASON FOR CALL**this is important as we prioritize the call backs  YOU WILL RECEIVE A CALL BACK THE SAME DAY AS LONG AS YOU CALL BEFORE 4:00 PM   

## 2020-06-25 NOTE — Telephone Encounter (Signed)
-----   Message from Allayne Butcher, [place] sent at 06/25/2020  3:06 PM EDT ----- EF remains less than 35%. Refer back to EP. He see's [name]. Please make f/u appointment to discuss potential ICD.

## 2020-06-26 ENCOUNTER — Telehealth (HOSPITAL_COMMUNITY): Payer: Self-pay | Admitting: Licensed Clinical Social Worker

## 2020-06-26 NOTE — Telephone Encounter (Signed)
Paramedic has been attempting to contact pt with no success over past several weeks- will be discharged from paramedicine program at this time for lack of communication- can reevalute if pt reaches back out and needs assistance  Burna Sis, LCSW Clinical Social Worker Advanced Heart Failure Clinic Desk#: [phone] Cell#: [phone]

## 2020-07-05 ENCOUNTER — Telehealth (HOSPITAL_COMMUNITY): Payer: Self-pay | Admitting: Cardiology

## 2020-07-05 MED ORDER — ATORVASTATIN CALCIUM 20 MG PO TABS: 20.0000 mg | ORAL_TABLET | Freq: Every day | ORAL | 3 refills | Status: DC

## 2020-07-05 NOTE — Addendum Note (Signed)
Addended by: Theresia Bough on: 07/05/2020 10:09 AM   Modules accepted: Orders

## 2020-07-05 NOTE — Telephone Encounter (Signed)
Pt aware and voiced understanding Med list updated and will recheck labs at follow up

## 2020-07-05 NOTE — Telephone Encounter (Signed)
-----   Message from Allayne Butcher, [place] sent at 06/25/2020  3:07 PM EDT ----- Renal function and K stable. Cholesterol elevated. Start Atorvastatin 20 mg daily. Repeat lipid panel and hepatic function test in 6 weeks

## 2020-07-12 ENCOUNTER — Telehealth (HOSPITAL_COMMUNITY): Payer: Self-pay | Admitting: Licensed Clinical Social Worker

## 2020-07-12 NOTE — Telephone Encounter (Signed)
New CSW in training, Rutherford Nail, called pt to see if they have received or been scheduled to receive the COVID-19 vaccine at this time.  Pt has not had any shots at this time but is interested in being set up.  CSW set up appt with [facility] clinic tomorrow at 1:45pm.  Pt informed and confirmed he has transport to appt.  Burna Sis, LCSW Clinical Social Worker Advanced [facility] Failure Clinic Desk#: [phone] Cell#: [phone]

## 2020-07-13 ENCOUNTER — Other Ambulatory Visit: Payer: Self-pay

## 2020-07-13 ENCOUNTER — Ambulatory Visit: Payer: Medicaid Other | Attending: Internal Medicine

## 2020-07-13 DIAGNOSIS — Z23 Encounter for immunization: Secondary | ICD-10-CM

## 2020-07-13 NOTE — Progress Notes (Signed)
   Covid-19 Vaccination Clinic  Name:  Laurance Heide    MRN: [id number] DOB: 06-19-1956  07/13/2020  Mr. Mutch was observed post Covid-19 immunization for 15 minutes without incident. He was provided with Vaccine Information Sheet and instruction to access the V-Safe system.   Mr. Kunesh was instructed to call 911 with any severe reactions post vaccine: Marland Kitchen Difficulty breathing  . Swelling of face and throat  . A fast heartbeat  . A bad rash all over body  . Dizziness and weakness   Immunizations Administered    Name Date Dose VIS Date Route   Pfizer COVID-19 Vaccine 07/13/2020  1:47 PM 0.3 mL 11/22/2018 Intramuscular   Manufacturer: [employer], [employer]   Lot: [id number]   NDC: [address]-4

## 2020-07-23 ENCOUNTER — Encounter: Payer: Self-pay | Admitting: Cardiology

## 2020-07-23 ENCOUNTER — Ambulatory Visit: Payer: Medicaid Other | Admitting: Cardiology

## 2020-07-23 ENCOUNTER — Other Ambulatory Visit: Payer: Self-pay

## 2020-07-23 VITALS — BP 122/58 | HR 74 | Ht 72.0 in | Wt 218.0 lb

## 2020-07-23 DIAGNOSIS — I5022 Chronic systolic (congestive) heart failure: Secondary | ICD-10-CM

## 2020-07-23 NOTE — Patient Instructions (Signed)
Medication Instructions:  Your physician recommends that you continue on your current medications as directed. Please refer to the Current Medication list given to you today.  *If you need a refill on your cardiac medications before your next appointment, please call your pharmacy*   Lab Work: None ordered If you have labs (blood work) drawn today and your tests are completely normal, you will receive your results only by:  MyChart Message (if you have MyChart) OR  A paper copy in the mail If you have any lab test that is abnormal or we need to change your treatment, we will call you to review the results.   Testing/Procedures: Your physician has recommended that you have a defibrillator inserted. An implantable cardioverter defibrillator (ICD) is a small device that is placed in your chest or, in rare cases, your abdomen. This device uses electrical pulses or shocks to help control life-threatening, irregular heartbeats that could lead the heart to suddenly stop beating (sudden cardiac arrest). Leads are attached to the ICD that goes into your heart. This is done in the hospital and usually requires an overnight stay. Please see the instructions below located under "other instructions".   The following dates are available (these are subject to change):  11/5, 11/10, 11/12, 11/24, 11/26, 12/1, 12/3, 12/8, 12/15  Please call the office when you are ready to schedule    Follow-Up: At [facility], you and your health needs are our priority.  As part of our continuing mission to provide you with exceptional heart care, we have created designated Provider Care Teams.  These Care Teams include your primary Cardiologist (physician) and Advanced Practice Providers (APPs -  Physician Assistants and Nurse Practitioners) who all work together to provide you with the care you need, when you need it.  We recommend signing up for the patient portal called "MyChart".  Sign up information is provided on  this After Visit Summary.  MyChart is used to connect with patients for Virtual Visits (Telemedicine).  Patients are able to view lab/test results, encounter notes, upcoming appointments, etc.  Non-urgent messages can be sent to your provider as well.   To learn more about what you can do with MyChart, go to [email or web address].    Your next appointment:   2 week(s) after your procedure on ___________  The format for your next appointment:   In Person  Provider:   device clinic for a wound check    Thank you for choosing [facility]!!    Dory Horn, RN [phone]   Other Instructions   Implantable Device Instructions  You are scheduled for: Implantable Cardiac Defibrillator on ____________ with [name].  1.   Pre procedure testing-             A.  LAB WORK--- On ______________ you are scheduled to have blood work at the [facility] (see address at the top of this letter) any time after 8:00 am.  You do not need to be fasting.               B. COVID TEST-- On ______________ @ ______________ - This is a Drive Up Visit at [address] [place], [place] [address].  Someone will direct you to the appropriate testing line. Stay in your car and someone will be with you shortly.   After you are tested please go home and self quarantine until the day of your procedure.    2. On the day of your procedure ___________  you will go to [facility] [phone]) at _____________.  You will go to the main entrance [address] [employer]) and enter where the [employer] parking staff are.  You will check in at ADMITTING.  You may have one support person come in to the hospital with you.  They will be asked to wait in the waiting room.   3.   Do not eat or drink after midnight prior to your procedure.   4.   On the morning of your procedure do NOT take any medication.  5.  The night before your procedure and the morning of your procedure scrub your neck/chest with  surgical scrub.  An instruction letter is included with this letter.     5.  Plan for an overnight stay.  If you use your phone frequently bring your phone charger.  When you are discharged you will need someone to drive you home.   6.  You will follow up with the [facility] Device clinic 10-14 days after your procedure. You will follow up with [name] 91 days after your procedure.  These appointments will be made for you.   * If you have ANY questions after you get home, please call the office [phone] and ask for Naythan Douthit RN or send a MyChart message.    Cardioverter Defibrillator Implantation  An implantable cardioverter defibrillator (ICD) is a small device that is placed under the skin in the chest or abdomen. An ICD consists of a battery, a small computer (pulse generator), and wires (leads) that go into the heart. An ICD is used to detect and correct two types of dangerous irregular heartbeats (arrhythmias):  A rapid heart rhythm (tachycardia).  An arrhythmia in which the lower chambers of the heart (ventricles) contract in an uncoordinated way (fibrillation). When an ICD detects tachycardia, it sends a low-energy shock to the heart to restore the heartbeat to normal (cardioversion). This signal is usually painless. If cardioversion does not work or if the ICD detects fibrillation, it delivers a high-energy shock to the heart (defibrillation) to restart the heart. This shock may feel like a strong jolt in the chest. Your health care provider may prescribe an ICD if:  You have had an arrhythmia that originated in the ventricles.  Your heart has been damaged by a disease or heart condition. Sometimes, ICDs are programmed to act as a device called a pacemaker. Pacemakers can be used to treat a slow heartbeat (bradycardia) or tachycardia by taking over the heart rate with electrical impulses. Tell a health care provider about:  Any allergies you have.  All medicines you are  taking, including vitamins, herbs, eye drops, creams, and over-the-counter medicines.  Any problems you or family members have had with anesthetic medicines.  Any blood disorders you have.  Any surgeries you have had.  Any medical conditions you have.  Whether you are pregnant or may be pregnant. What are the risks? Generally, this is a safe procedure. However, problems may occur, including:  Swelling, bleeding, or bruising.  Infection.  Blood clots.  Damage to other structures or organs, such as nerves, blood vessels, or the heart.  Allergic reactions to medicines used during the procedure. What happens before the procedure? Staying hydrated Follow instructions from your health care provider about hydration, which may include:  Up to 2 hours before the procedure - you may continue to drink clear liquids, such as water, clear fruit juice, black coffee, and plain tea. Eating  and drinking restrictions Follow instructions from your health care provider about eating and drinking, which may include:  8 hours before the procedure - stop eating heavy meals or foods such as meat, fried foods, or fatty foods.  6 hours before the procedure - stop eating light meals or foods, such as toast or cereal.  6 hours before the procedure - stop drinking milk or drinks that contain milk.  2 hours before the procedure - stop drinking clear liquids. Medicine Ask your health care provider about:  Changing or stopping your normal medicines. This is important if you take diabetes medicines or blood thinners.  Taking medicines such as aspirin and ibuprofen. These medicines can thin your blood. Do not take these medicines before your procedure if your doctor tells you not to. Tests  You may have blood tests.  You may have a test to check the electrical signals in your heart (electrocardiogram, ECG).  You may have imaging tests, such as a chest X-ray. General instructions  For 24 hours  before the procedure, stop using products that contain nicotine or tobacco, such as cigarettes and e-cigarettes. If you need help quitting, ask your health care provider.  Plan to have someone take you home from the hospital or clinic.  You may be asked to shower with a germ-killing soap. What happens during the procedure?  To reduce your risk of infection: ? Your health care team will wash or sanitize their hands. ? Your skin will be washed with soap. ? Hair may be removed from the surgical area.  Small monitors will be put on your body. They will be used to check your heart, blood pressure, and oxygen level.  An IV tube will be inserted into one of your veins.  You will be given one or more of the following: ? A medicine to help you relax (sedative). ? A medicine to numb the area (local anesthetic). ? A medicine to make you fall asleep (general anesthetic).  Leads will be guided through a blood vessel into your heart and attached to your heart muscles. Depending on the ICD, the leads may go into one ventricle or they may go into both ventricles and into an upper chamber of the heart. An X-ray machine (fluoroscope) will be usedto help guide the leads.  A small incision will be made to create a deep pocket under your skin.  The pulse generator will be placed into the pocket.  The ICD will be tested.  The incision will be closed with stitches (sutures), skin glue, or staples.  A bandage (dressing) will be placed over the incision. This procedure may vary among health care providers and hospitals. What happens after the procedure?  Your blood pressure, heart rate, breathing rate, and blood oxygen level will be monitored often until the medicines you were given have worn off.  A chest X-ray will be taken to check that the ICD is in the right place.  You will need to stay in the hospital for 1-2 days so your health care provider can make sure your ICD is working.  Do not drive  for 24 hours if you received a sedative. Ask your health care provider when it is safe for you to drive.  You may be given an identification card explaining that you have an ICD. Summary  An implantable cardioverter defibrillator (ICD) is a small device that is placed under the skin in the chest or abdomen. It is used to detect and correct dangerous irregular  heartbeats (arrhythmias).  An ICD consists of a battery, a small computer (pulse generator), and wires (leads) that go into the heart.  When an ICD detects rapid heart rhythm (tachycardia), it sends a low-energy shock to the heart to restore the heartbeat to normal (cardioversion). If cardioversion does not work or if the ICD detects uncoordinated heart contractions (fibrillation), it delivers a high-energy shock to the heart (defibrillation) to restart the heart.  You will need to stay in the hospital for 1-2 days to make sure your ICD is working. This information is not intended to replace advice given to you by your health care provider. Make sure you discuss any questions you have with your health care provider. Document Revised: 08/27/2017 Document Reviewed: 09/23/2016 Elsevier Patient Education  2020 [employer].

## 2020-07-23 NOTE — Progress Notes (Signed)
Electrophysiology Office Note   Date:  07/23/2020   ID:  Corey Brady, DOB May 19, 1956, MRN [id number]  PCP:  Marcine Matar, MD  Cardiologist:  Bensimhon Primary Electrophysiologist:  Syreeta Figler Jorja Loa, MD    Chief Complaint: atrial flutter   History of Present Illness: Corey Brady is a 64 y.o. male who is being seen today for the evaluation of atrial flutter at the request of Bensimhon. Presenting today for electrophysiology evaluation.  He has a history of alcohol abuse with possible alcoholic cardiomyopathy, hypertension, and atrial flutter.  He was also found to have single-vessel coronary artery disease and now status post single-vessel CABG.  He is status post atrial flutter ablation 03/15/2020.  He had a repeat echo after his atrial flutter ablation that showed an ejection fraction of 25 to 30%.   Today, denies symptoms of palpitations, chest pain, shortness of breath, orthopnea, PND, lower extremity edema, claudication, dizziness, presyncope, syncope, bleeding, or neurologic sequela. The patient is tolerating medications without difficulties.  He currently feels well.  He has no chest pain and only mild shortness of breath with exertion.   Past Medical History:  Diagnosis Date  . CHF (congestive heart failure) (HCC)   . Gout   . No blood products 01/27/2020   Past Surgical History:  Procedure Laterality Date  . A-FLUTTER ABLATION N/A 03/15/2020   Procedure: A-FLUTTER ABLATION;  Surgeon: Regan Lemming, MD;  Location: [facility] INVASIVE CV LAB;  Service: Cardiovascular;  Laterality: N/A;  . CARDIOVERSION N/A 02/06/2020   Procedure: CARDIOVERSION;  Surgeon: Dolores Patty, MD;  Location: [facility] ENDOSCOPY;  Service: Cardiovascular;  Laterality: N/A;  . CORONARY ARTERY BYPASS GRAFT N/A 01/30/2020   Procedure: OFF PUMP CORONARY ARTERY BYPASS GRAFTING (CABG) TIMES ONE;  Surgeon: Corliss Skains, MD;  Location: [facility] OR;  Service: Open Heart Surgery;  Laterality: N/A;  swan  only  . RIGHT/LEFT HEART CATH AND CORONARY ANGIOGRAPHY N/A 01/26/2020   Procedure: RIGHT/LEFT HEART CATH AND CORONARY ANGIOGRAPHY;  Surgeon: Dolores Patty, MD;  Location: [facility] INVASIVE CV LAB;  Service: Cardiovascular;  Laterality: N/A;  . TEE WITHOUT CARDIOVERSION N/A 01/30/2020   Procedure: TRANSESOPHAGEAL ECHOCARDIOGRAM (TEE);  Surgeon: Corliss Skains, MD;  Location: [facility] OR;  Service: Open Heart Surgery;  Laterality: N/A;  . TEE WITHOUT CARDIOVERSION N/A 02/06/2020   Procedure: TRANSESOPHAGEAL ECHOCARDIOGRAM (TEE);  Surgeon: Dolores Patty, MD;  Location: [facility] ENDOSCOPY;  Service: Cardiovascular;  Laterality: N/A;     Current Outpatient Medications  Medication Sig Dispense Refill  . aspirin 81 MG chewable tablet Chew 1 tablet (81 mg total) by mouth daily.    Marland Kitchen atorvastatin (LIPITOR) 20 MG tablet Take 1 tablet (20 mg total) by mouth daily. 90 tablet 3  . dapagliflozin propanediol (FARXIGA) 10 MG TABS tablet Take 1 tablet (10 mg total) by mouth daily before breakfast. 30 tablet 6  . furosemide (LASIX) 40 MG tablet Take 40 mg by mouth daily.    . hydrocerin (EUCERIN) CREA Apply 1 application topically 2 (two) times daily. 228 g 0  . losartan (COZAAR) 25 MG tablet Take 25 mg by mouth daily.    . metoprolol succinate (TOPROL XL) 25 MG 24 hr tablet Take 1 tablet (25 mg total) by mouth daily. 30 tablet 11  . sacubitril-valsartan (ENTRESTO) 97-103 MG Take 1 tablet by mouth 2 (two) times daily. 60 tablet 6  . spironolactone (ALDACTONE) 25 MG tablet Take 0.5 tablets (12.5 mg total) by mouth daily. 15 tablet 6  . triamcinolone  cream (KENALOG) 0.1 % Apply 1 application topically 2 (two) times daily. 45 g 2   No current facility-administered medications for this visit.    Allergies:   Other   Social History:  The patient  reports that he has never smoked. He has never used smokeless tobacco. He reports current alcohol use.   Family History:  The patient's family history includes Gout in  his sister.   ROS:  Please see the history of present illness.   Otherwise, review of systems is positive for none.   All other systems are reviewed and negative.   PHYSICAL EXAM: VS:  BP (!) 122/58   Pulse 74   Ht 6' (1.829 m)   Wt 218 lb (98.9 kg)   SpO2 97%   BMI 29.57 kg/m  , BMI Body mass index is 29.57 kg/m. GEN: Well nourished, well developed, in no acute distress  HEENT: normal  Neck: no JVD, carotid bruits, or masses Cardiac: RRR; no murmurs, rubs, or gallops,no edema  Respiratory:  clear to auscultation bilaterally, normal work of breathing GI: soft, nontender, nondistended, + BS MS: no deformity or atrophy  Skin: warm and dry Neuro:  Strength and sensation are intact Psych: euthymic mood, full affect  EKG:  EKG is not ordered today. Personal review of the ekg ordered 06/25/20 shows sinus rhythm, rate 87   Recent Labs: 03/12/2020: ALT 28; B Natriuretic Peptide 533.8; TSH 4.165 03/14/2020: Magnesium 2.1 03/29/2020: Hemoglobin 13.1; Platelets 215 06/25/2020: BUN 21; Creatinine, Ser 1.02; Potassium 4.2; Sodium 138    Lipid Panel     Component Value Date/Time   CHOL 169 06/25/2020 1156   TRIG 82 06/25/2020 1156   HDL 46 06/25/2020 1156   CHOLHDL 3.7 06/25/2020 1156   VLDL 16 06/25/2020 1156   LDLCALC 107 (H) 06/25/2020 1156     Wt Readings from Last 3 Encounters:  07/23/20 218 lb (98.9 kg)  06/25/20 220 lb 3.2 oz (99.9 kg)  05/31/20 221 lb 14.4 oz (100.7 kg)      Other studies Reviewed: Additional studies/ records that were reviewed today include: TTE 06/25/2020 Review of the above records today demonstrates:  1. Global hypokinesis worse in the septal myocardium. Left ventricular  ejection fraction, by estimation, is 25 to 30%. The left ventricle has  severely decreased function. The left ventricle demonstrates global  hypokinesis. Left ventricular diastolic  parameters are consistent with Grade I diastolic dysfunction (impaired  relaxation).  2. Right  ventricular systolic function is moderately reduced. The right  ventricular size is normal.  3. The mitral valve is normal in structure. No evidence of mitral valve  regurgitation. No evidence of mitral stenosis.  4. The aortic valve is tricuspid. Aortic valve regurgitation is not  visualized. No aortic stenosis is present.  5. The inferior vena cava is normal in size with greater than 50%  respiratory variability, suggesting right atrial pressure of 3 mmHg.    ASSESSMENT AND PLAN:  1.  Typical atrial flutter: Status post ablation 03/15/2020.  Currently not anticoagulated as he has had no recurrences.    2.  Nonischemic cardiomyopathy: Possibly due to alcoholic cardiomyopathy but did have coronary artery disease so could be ischemic in nature as well.  Currently on Entresto, Aldactone, Toprol-XL, Farxiga.  Repeat echo shows an ejection fraction of 25 to 30%.  He would benefit from an ICD.  He has had some junctional rhythm and thus a dual-chamber ICD would likely be beneficial.  Risks and benefits were discussed which  include bleeding, tamponade, infection, pneumothorax, lead dislodgment, inappropriate shocks.  The patient would like to further consider his options and Laurel Smeltz get back to [place].  He does feel that he Sohrab Keelan agree to the procedure.  3.  Coronary artery disease: Left heart cath with severe one-vessel disease.  Status post one-vessel CABG with LIMA to the LAD 01/30/2020.  Continue aspirin and atorvastatin.  Case discussed with primary cardiology  Current medicines are reviewed at length with the patient today.   The patient does not have concerns regarding his medicines.  The following changes were made today: None  Labs/ tests ordered today include:  No orders of the defined types were placed in this encounter.    Disposition:   FU with Infantof Villagomez 3 months  Signed, Zienna Ahlin Jorja Loa, MD  07/23/2020 9:46 AM     [facility] [address] [place] [address] [phone] (office) [phone] (fax)

## 2020-08-09 ENCOUNTER — Encounter: Payer: Self-pay | Admitting: *Deleted

## 2020-08-09 ENCOUNTER — Telehealth: Payer: Self-pay | Admitting: Cardiology

## 2020-08-09 DIAGNOSIS — Z01812 Encounter for preprocedural laboratory examination: Secondary | ICD-10-CM

## 2020-08-09 DIAGNOSIS — I5022 Chronic systolic (congestive) heart failure: Secondary | ICD-10-CM

## 2020-08-09 NOTE — Telephone Encounter (Signed)
Pt scheduled for ICD implant 12/28. Covid screening scheduled for day before (d/t holiday), pt aware to arrive by 8:30 for this blood work. Pt will pick up letter of instructions on 12/2, when he stops by the office for pre procedure labs. Aware office will call to arrange post procedure follow up. Patient verbalized understanding and agreeable to plan.

## 2020-08-09 NOTE — Telephone Encounter (Signed)
Patient is requesting to discuss going forward with having ICD implanted. Please call.

## 2020-08-10 ENCOUNTER — Ambulatory Visit: Payer: Medicaid Other | Attending: Internal Medicine

## 2020-08-10 DIAGNOSIS — Z23 Encounter for immunization: Secondary | ICD-10-CM

## 2020-08-10 NOTE — Progress Notes (Signed)
   Covid-19 Vaccination Clinic  Name:  Corey Brady    MRN: [id number] DOB: December 02, 1955  08/10/2020  [name] was observed post Covid-19 immunization for 15 minutes without incident. He was provided with Vaccine Information Sheet and instruction to access the V-Safe system.   [name] was instructed to call 911 with any severe reactions post vaccine: Marland Kitchen Difficulty breathing  . Swelling of face and throat  . A fast heartbeat  . A bad rash all over body  . Dizziness and weakness   Immunizations Administered    Name Date Dose VIS Date Route   Pfizer COVID-19 Vaccine 08/10/2020  1:24 PM 0.3 mL 07/17/2020 Intramuscular   Manufacturer: [employer], [employer]   Lot: [id number]   NDC: [address]-2

## 2020-08-29 ENCOUNTER — Other Ambulatory Visit: Payer: Self-pay

## 2020-08-29 ENCOUNTER — Ambulatory Visit (INDEPENDENT_AMBULATORY_CARE_PROVIDER_SITE_OTHER): Payer: Medicaid Other | Admitting: Vascular Surgery

## 2020-08-29 ENCOUNTER — Encounter: Payer: Self-pay | Admitting: Vascular Surgery

## 2020-08-29 ENCOUNTER — Other Ambulatory Visit: Payer: Medicaid Other

## 2020-08-29 VITALS — BP 112/62 | HR 70 | Temp 97.5°F | Resp 18 | Ht 72.0 in | Wt 223.0 lb

## 2020-08-29 DIAGNOSIS — I872 Venous insufficiency (chronic) (peripheral): Secondary | ICD-10-CM

## 2020-08-29 NOTE — Progress Notes (Signed)
REASON FOR VISIT:   42-month follow-up visit.  MEDICAL ISSUES:   CHRONIC VENOUS INSUFFICIENCY: This patient has evidence of CEAP C4a venous disease.  However, he does not have any significant symptoms or significant swelling at this point.  We have discussed the importance of intermittent leg elevation of the proper positioning for this.  In addition if his symptoms return I encouraged him to wear his thigh-high compression stockings with a gradient of 20 to 30 mmHg which she has.  I encouraged him to avoid prolonged sitting and standing.  We discussed importance of exercise specifically walking and water aerobics.  If his symptoms worsen in the future and the stockings are not helpful he could potentially be a candidate for staged laser ablation of both great saphenous veins.  However currently he is not having significant symptoms I would not pursue that at this time.  He will call if his symptoms or swelling worsen.  HPI:   Corey Brady is a pleasant 64 y.o. male who was seen by Graceann Congress, PA on 05/23/2020 with venous disease.  The patient was having problems with swelling and redness in the legs which have been going on for over 3 months.  The patient works standing in an [employer] and this aggravates his symptoms.  His symptoms were alleviated by elevation and compression.  He was not requiring pain medicine.  He had no previous history of DVT and he has had no previous venous procedures.  The results of his venous duplex scan at that time are discussed below.  Since his last visit his swelling has improved significantly.  He denies any significant aching pain or heaviness in his legs.  He has had no previous history of DVT and no previous venous procedures.  He has not really been wearing his compression stockings.  He does elevate his legs some.  The swelling is best after sleeping at night.  Past Medical History:  Diagnosis Date  . CHF (congestive heart failure) (HCC)   .  Gout   . No blood products 01/27/2020    Family History  Problem Relation Age of Onset  . Gout Sister     SOCIAL HISTORY: Social History   Tobacco Use  . Smoking status: Never Smoker  . Smokeless tobacco: Never Used  Substance Use Topics  . Alcohol use: Yes    Allergies  Allergen Reactions  . Other Other (See Comments)    No blood products    Current Outpatient Medications  Medication Sig Dispense Refill  . aspirin 81 MG chewable tablet Chew 1 tablet (81 mg total) by mouth daily.    Marland Kitchen atorvastatin (LIPITOR) 20 MG tablet Take 1 tablet (20 mg total) by mouth daily. 90 tablet 3  . dapagliflozin propanediol (FARXIGA) 10 MG TABS tablet Take 1 tablet (10 mg total) by mouth daily before breakfast. 30 tablet 6  . furosemide (LASIX) 40 MG tablet Take 40 mg by mouth daily.    . hydrocerin (EUCERIN) CREA Apply 1 application topically 2 (two) times daily. 228 g 0  . losartan (COZAAR) 25 MG tablet Take 25 mg by mouth daily.    . metoprolol succinate (TOPROL XL) 25 MG 24 hr tablet Take 1 tablet (25 mg total) by mouth daily. 30 tablet 11  . sacubitril-valsartan (ENTRESTO) 97-103 MG Take 1 tablet by mouth 2 (two) times daily. 60 tablet 6  . spironolactone (ALDACTONE) 25 MG tablet Take 0.5 tablets (12.5 mg total) by mouth daily. 15 tablet 6  .  triamcinolone cream (KENALOG) 0.1 % Apply 1 application topically 2 (two) times daily. 45 g 2   No current facility-administered medications for this visit.    REVIEW OF SYSTEMS:  [X]  denotes positive finding, [ ]  denotes negative finding Cardiac  Comments:  Chest pain or chest pressure:    Shortness of breath upon exertion:    Short of breath when lying flat:    Irregular heart rhythm:        Vascular    Pain in calf, thigh, or hip brought on by ambulation:    Pain in feet at night that wakes you up from your sleep:     Blood clot in your veins:    Leg swelling:         Pulmonary    Oxygen at home:    Productive cough:     Wheezing:          Neurologic    Sudden weakness in arms or legs:     Sudden numbness in arms or legs:     Sudden onset of difficulty speaking or slurred speech:    Temporary loss of vision in one eye:     Problems with dizziness:         Gastrointestinal    Blood in stool:     Vomited blood:         Genitourinary    Burning when urinating:     Blood in urine:        Psychiatric    Major depression:         Hematologic    Bleeding problems:    Problems with blood clotting too easily:        Skin    Rashes or ulcers:        Constitutional    Fever or chills:     PHYSICAL EXAM:   There were no vitals filed for this visit.  GENERAL: The patient is a well-nourished male, in no acute distress. The vital signs are documented above. CARDIAC: There is a regular rate and rhythm.  VASCULAR: I do not detect carotid bruits. He has palpable dorsalis pedis and posterior tibial pulses bilaterally. He has no significant swelling currently. He has hyperpigmentation bilaterally consistent with chronic venous insufficiency as documented in the photograph below.    PULMONARY: There is good air exchange bilaterally without wheezing or rales. ABDOMEN: Soft and non-tender with normal pitched bowel sounds.  MUSCULOSKELETAL: There are no major deformities or cyanosis. NEUROLOGIC: No focal weakness or paresthesias are detected. SKIN: There are no ulcers or rashes noted. PSYCHIATRIC: The patient has a normal affect.  DATA:    VENOUS DUPLEX: I have reviewed the venous duplex scan that was done on 05/23/2020.  On the right side there was no evidence of DVT or superficial venous thrombosis.  There is deep venous reflux involving the common femoral vein.  There was superficial venous reflux involving the right great saphenous vein from the saphenofemoral junction of the proximal calf.  Diameters of the vein ranged from 0.46-0.74 cm.  On the left side there was no evidence of DVT or superficial venous  thrombosis.  There was no deep venous reflux.  There was superficial venous reflux involving the left great saphenous vein.  Diameters of the vein ranged from 0.4-0.54 cm.  03-01-1988 [facility] [phone]

## 2020-09-15 ENCOUNTER — Other Ambulatory Visit (HOSPITAL_COMMUNITY): Payer: Self-pay | Admitting: Cardiology

## 2020-09-18 ENCOUNTER — Telehealth: Payer: Self-pay | Admitting: Cardiology

## 2020-09-18 NOTE — Telephone Encounter (Signed)
° ° °  Pt is calling, he would like to know if his insurance approved his upcoming procedure

## 2020-09-18 NOTE — Telephone Encounter (Signed)
Spoke to pt and his brother. Advised to call insurance to discuss if procedure covered.  Aware our office pre certifies with insurance if required. Discussed instructions for procedure. Answered questions related to procedure and why it is needed. Pt and brother verbalized understanding and agreeable to plan.

## 2020-09-23 ENCOUNTER — Other Ambulatory Visit: Payer: Medicaid Other

## 2020-09-23 ENCOUNTER — Other Ambulatory Visit (HOSPITAL_COMMUNITY)
Admission: RE | Admit: 2020-09-23 | Discharge: 2020-09-23 | Disposition: A | Discharge status: Home / Self Care | Payer: Medicaid Other | Source: Ambulatory Visit | Attending: Cardiology | Admitting: Cardiology

## 2020-09-23 ENCOUNTER — Telehealth: Payer: Self-pay | Admitting: Cardiology

## 2020-09-23 ENCOUNTER — Other Ambulatory Visit: Payer: Self-pay

## 2020-09-23 DIAGNOSIS — Z01812 Encounter for preprocedural laboratory examination: Secondary | ICD-10-CM | POA: Diagnosis not present

## 2020-09-23 DIAGNOSIS — Z20822 Contact with and (suspected) exposure to covid-19: Secondary | ICD-10-CM | POA: Insufficient documentation

## 2020-09-23 LAB — SARS CORONAVIRUS 2 (TAT 6-24 HRS): SARS Coronavirus 2: NEGATIVE

## 2020-09-23 MED ORDER — LOSARTAN POTASSIUM 25 MG PO TABS
25.0000 mg | ORAL_TABLET | Freq: Every day | ORAL | 2 refills | Status: DC
Start: 2020-09-23 — End: 2020-10-02

## 2020-09-23 NOTE — Progress Notes (Signed)
Called patient regarding instructions for procedure tomorrow.  Left voicemail with the following instructions, nothing to eat or drink after midnight,  No medications in the morning, need responsible adult to drive you home tomorrow and stay with you for 1st 24 hours.  Follow medication instructions given to you by Dr Elberta Fortis office staff

## 2020-09-23 NOTE — Telephone Encounter (Signed)
Followed up with brother (ok to speak with him per pt). Advised pt will not have enough blood loss to require "blood filtering machine".  brother appreciated the return call.  He will inform pt when he returns from blood work.

## 2020-09-23 NOTE — Telephone Encounter (Signed)
   Pt and his brother calling, they would like to speak with a nurse. They have question about procedure tomorrow. They're asking about machine to use to filter his blood

## 2020-09-24 ENCOUNTER — Ambulatory Visit (HOSPITAL_COMMUNITY)
Admission: RE | Admit: 2020-09-24 | Discharge: 2020-09-24 | Disposition: A | Discharge status: Home / Self Care | Payer: Medicaid Other | Attending: Cardiology | Admitting: Cardiology

## 2020-09-24 ENCOUNTER — Ambulatory Visit (HOSPITAL_COMMUNITY): Payer: Medicaid Other

## 2020-09-24 ENCOUNTER — Other Ambulatory Visit: Payer: Self-pay

## 2020-09-24 ENCOUNTER — Encounter (HOSPITAL_COMMUNITY)
Admission: RE | Disposition: A | Discharge status: Home / Self Care | Payer: Medicaid Other | Source: Home / Self Care | Attending: Cardiology

## 2020-09-24 DIAGNOSIS — I428 Other cardiomyopathies: Secondary | ICD-10-CM

## 2020-09-24 DIAGNOSIS — I2510 Atherosclerotic heart disease of native coronary artery without angina pectoris: Secondary | ICD-10-CM | POA: Diagnosis not present

## 2020-09-24 DIAGNOSIS — Z951 Presence of aortocoronary bypass graft: Secondary | ICD-10-CM | POA: Insufficient documentation

## 2020-09-24 DIAGNOSIS — I509 Heart failure, unspecified: Secondary | ICD-10-CM | POA: Insufficient documentation

## 2020-09-24 DIAGNOSIS — I483 Typical atrial flutter: Secondary | ICD-10-CM | POA: Diagnosis not present

## 2020-09-24 DIAGNOSIS — Z95818 Presence of other cardiac implants and grafts: Secondary | ICD-10-CM

## 2020-09-24 DIAGNOSIS — Z006 Encounter for examination for normal comparison and control in clinical research program: Secondary | ICD-10-CM | POA: Diagnosis not present

## 2020-09-24 HISTORY — PX: ICD IMPLANT: EP1208

## 2020-09-24 LAB — CBC
HCT: 44.6 % (ref 39.0–52.0)
Hemoglobin: 15.4 g/dL (ref 13.0–17.0)
MCH: 32.3 pg (ref 26.0–34.0)
MCHC: 34.5 g/dL (ref 30.0–36.0)
MCV: 93.5 fL (ref 80.0–100.0)
Platelets: 193 10*3/uL (ref 150–400)
RBC: 4.77 MIL/uL (ref 4.22–5.81)
RDW: 12.5 % (ref 11.5–15.5)
WBC: 7.1 10*3/uL (ref 4.0–10.5)
nRBC: 0 % (ref 0.0–0.2)

## 2020-09-24 LAB — BASIC METABOLIC PANEL
Anion gap: 9 (ref 5–15)
BUN: 17 mg/dL (ref 8–23)
CO2: 25 mmol/L (ref 22–32)
Calcium: 9.2 mg/dL (ref 8.9–10.3)
Chloride: 106 mmol/L (ref 98–111)
Creatinine, Ser: 1.18 mg/dL (ref 0.61–1.24)
GFR, Estimated: >60 mL/min (ref >60)
Glucose, Bld: 114 mg/dL — ABNORMAL HIGH (ref 70–99)
Potassium: 4.4 mmol/L (ref 3.5–5.1)
Sodium: 140 mmol/L (ref 135–145)

## 2020-09-24 LAB — NO BLOOD PRODUCTS

## 2020-09-24 SURGERY — ICD IMPLANT

## 2020-09-24 MED ORDER — FENTANYL CITRATE (PF) 100 MCG/2ML IJ SOLN
INTRAMUSCULAR | Status: DC | PRN
  Administered 2020-09-24: 25 ug via INTRAVENOUS

## 2020-09-24 MED ORDER — SODIUM CHLORIDE 0.9 % IV SOLN: INTRAVENOUS | Status: DC

## 2020-09-24 MED ORDER — LIDOCAINE HCL (PF) 1 % IJ SOLN
INTRAMUSCULAR | Status: DC | PRN
  Administered 2020-09-24: 60 mL

## 2020-09-24 MED ORDER — HEPARIN (PORCINE) IN NACL 1000-0.9 UT/500ML-% IV SOLN
INTRAVENOUS | Status: AC
  Filled 2020-09-24: qty 500

## 2020-09-24 MED ORDER — CHLORHEXIDINE GLUCONATE 4 % EX LIQD
4.0000 "application " | Freq: Once | CUTANEOUS | Status: DC
  Filled 2020-09-24: qty 60

## 2020-09-24 MED ORDER — ONDANSETRON HCL 4 MG/2ML IJ SOLN: 4.0000 mg | Freq: Four times a day (QID) | INTRAMUSCULAR | Status: DC | PRN

## 2020-09-24 MED ORDER — FENTANYL CITRATE (PF) 100 MCG/2ML IJ SOLN
INTRAMUSCULAR | Status: AC
  Filled 2020-09-24: qty 2

## 2020-09-24 MED ORDER — CEFAZOLIN SODIUM-DEXTROSE 1-4 GM/50ML-% IV SOLN
1.0000 g | Freq: Four times a day (QID) | INTRAVENOUS | Status: DC
  Filled 2020-09-24: qty 50

## 2020-09-24 MED ORDER — HEPARIN (PORCINE) IN NACL 1000-0.9 UT/500ML-% IV SOLN
INTRAVENOUS | Status: DC | PRN
  Administered 2020-09-24: 500 mL

## 2020-09-24 MED ORDER — ACETAMINOPHEN 325 MG PO TABS
325.0000 mg | ORAL_TABLET | ORAL | Status: DC | PRN
Start: 2020-09-24 — End: 2020-09-25

## 2020-09-24 MED ORDER — MIDAZOLAM HCL 5 MG/5ML IJ SOLN
INTRAMUSCULAR | Status: DC | PRN
  Administered 2020-09-24: 1 mg via INTRAVENOUS

## 2020-09-24 MED ORDER — SODIUM CHLORIDE 0.9 % IV SOLN
80.0000 mg | INTRAVENOUS | Status: AC
  Administered 2020-09-24: 15:00:00 80 mg

## 2020-09-24 MED ORDER — SODIUM CHLORIDE 0.9 % IV SOLN
INTRAVENOUS | Status: DC | PRN
  Administered 2020-09-24: 15:00:00 500 mL

## 2020-09-24 MED ORDER — CEFAZOLIN SODIUM-DEXTROSE 2-4 GM/100ML-% IV SOLN
2.0000 g | INTRAVENOUS | Status: AC
  Administered 2020-09-24: 14:00:00 2 g via INTRAVENOUS

## 2020-09-24 MED ORDER — SODIUM CHLORIDE 0.9 % IV SOLN
INTRAVENOUS | Status: AC
  Filled 2020-09-24: qty 2

## 2020-09-24 MED ORDER — CEFAZOLIN SODIUM-DEXTROSE 2-4 GM/100ML-% IV SOLN
INTRAVENOUS | Status: AC
  Filled 2020-09-24: qty 100

## 2020-09-24 MED ORDER — LIDOCAINE HCL (PF) 1 % IJ SOLN
INTRAMUSCULAR | Status: AC
  Filled 2020-09-24: qty 60

## 2020-09-24 MED ORDER — MIDAZOLAM HCL 5 MG/5ML IJ SOLN
INTRAMUSCULAR | Status: AC
  Filled 2020-09-24: qty 5

## 2020-09-24 SURGICAL SUPPLY — 8 items
CABLE SURGICAL S-101-97-12 (CABLE) ×3 IMPLANT
ICD VISIA MRI VR DVFB1D4 (ICD Generator) ×1 IMPLANT
LEAD SPRINT QUAT SEC 6935M-62 (Lead) ×3 IMPLANT
PAD PRO RADIOLUCENT 2001M-C (PAD) ×3 IMPLANT
SHEATH 9FR PRELUDE SNAP 13 (SHEATH) ×3 IMPLANT
SHEATH PROBE COVER 6X72 (BAG) ×3 IMPLANT
TRAY PACEMAKER INSERTION (PACKS) ×3 IMPLANT
VISIA MRI VR DVFB1D4 (ICD Generator) ×3 IMPLANT

## 2020-09-24 NOTE — Discharge Instructions (Signed)
After Your ICD (Implantable Cardiac Defibrillator)   . You have a [employer] ICD  ACTIVITY . Do not lift your arm above shoulder height for 1 week after your procedure. After 7 days, you may progress as below.  . You should remove your sling 24 hours after your procedure, unless otherwise instructed by your provider.     Tuesday October 01, 2020  Wednesday October 02, 2020 Thursday October 03, 2020 Friday October 04, 2020   . Do not lift, push, pull, or carry anything over 10 pounds with the affected arm until 6 weeks (Tuesday November 05, 2020 ) after your procedure.   . Do NOT DRIVE until you have been seen for your wound check, or as long as instructed by your healthcare provider.   . Ask your healthcare provider when you can go back to work   INCISION/Dressing . If you are on a blood thinner such as Coumadin, Xarelto, Eliquis, Plavix, or Pradaxa please confirm with your provider when this should be resumed. 09/24/2020  . Monitor your defibrillator site for redness, swelling, and drainage. Call the device clinic at [phone] if you experience these symptoms or fever/chills.  . If your incision is sealed with Steri-strips or staples, you may shower 10 days after your procedure or when told by your provider. Do not remove the steri-strips or let the shower hit directly on your site. You may wash around your site with soap and water.    Marland Kitchen Avoid lotions, ointments, or perfumes over your incision until it is well-healed.  . You may use a hot tub or a pool AFTER your wound check appointment if the incision is completely closed.  . Your ICD is designed to protect you from life threatening heart rhythms. Because of this, you may receive a shock.   o 1 shock with no symptoms:  Call the office during business hours. o 1 shock with symptoms (chest pain, chest pressure, dizziness, lightheadedness, shortness of breath, overall feeling unwell):  Call 911. o If you experience 2 or more shocks in  24 hours:  Call 911. o If you receive a shock, you should not drive for 6 months per the [place] DMV IF you receive appropriate therapy from your ICD.   . ICD Alerts:  Some alerts are vibratory and others beep. These are NOT emergencies. Please call our office to let us know. If this occurs at night or on weekends, it can wait until the next business day. Send a remote transmission.  . If your device is capable of reading fluid status (for heart failure), you will be offered monthly monitoring to review this with you.   DEVICE MANAGEMENT . Remote monitoring is used to monitor your ICD from home. This monitoring is scheduled every 91 days by our office. It allows [place] to keep an eye on the functioning of your device to ensure it is working properly. You will routinely see your Electrophysiologist annually (more often if necessary).   . You should receive your ID card for your new device in 4-8 weeks. Keep this card with you at all times once received. Consider wearing a medical alert bracelet or necklace.  . Your ICD  may be MRI compatible. This will be discussed at your next office visit/wound check.  You should avoid contact with strong electric or magnetic fields.    Do not use amateur (ham) radio equipment or electric (arc) welding torches. MP3 player headphones with magnets should not be used. Some devices are safe  to use if held at least 12 inches (30 cm) from your defibrillator. These include power tools, lawn mowers, and speakers. If you are unsure if something is safe to use, ask your health care provider.   When using your cell phone, hold it to the ear that is on the opposite side from the defibrillator. Do not leave your cell phone in a pocket over the defibrillator.   You may safely use electric blankets, heating pads, computers, and microwave ovens.  Call the office right away if:  You have chest pain.  You feel more than one shock.  You feel more short of breath than you have felt  before.  You feel more light-headed than you have felt before.  Your incision starts to open up.  This information is not intended to replace advice given to you by your health care provider. Make sure you discuss any questions you have with your health care provider.

## 2020-09-24 NOTE — H&P (Signed)
Electrophysiology Office Note   Date:  09/24/2020   ID:  Corey Brady, DOB [birth year], MRN [id number]  PCP:  Marcine Matar, MD  Cardiologist:  Bensimhon Primary Electrophysiologist:  Nysir Fergusson Jorja Loa, MD    Chief Complaint: atrial flutter   History of Present Illness: Corey Brady is a 64 y.o. male who is being seen today for the evaluation of atrial flutter at the request of Bensimhon. Presenting today for electrophysiology evaluation.  He has a history of alcohol abuse with possible alcoholic cardiomyopathy, hypertension, and atrial flutter.  He was also found to have single-vessel coronary artery disease and now status post single-vessel CABG.  He is status post atrial flutter ablation 03/15/2020.  He had a repeat echo after his atrial flutter ablation that showed an ejection fraction of 25 to 30%.  Today, denies symptoms of palpitations, chest pain, shortness of breath, orthopnea, PND, lower extremity edema, claudication, dizziness, presyncope, syncope, bleeding, or neurologic sequela. The patient is tolerating medications without difficulties. Plan ICD today.    Past Medical History:  Diagnosis Date  . CHF (congestive heart failure) (HCC)   . Gout   . No blood products 01/27/2020   Past Surgical History:  Procedure Laterality Date  . A-FLUTTER ABLATION N/A 03/15/2020   Procedure: A-FLUTTER ABLATION;  Surgeon: Regan Lemming, MD;  Location: [facility] INVASIVE CV LAB;  Service: Cardiovascular;  Laterality: N/A;  . CARDIOVERSION N/A 02/06/2020   Procedure: CARDIOVERSION;  Surgeon: Dolores Patty, MD;  Location: [facility] ENDOSCOPY;  Service: Cardiovascular;  Laterality: N/A;  . CORONARY ARTERY BYPASS GRAFT N/A 01/30/2020   Procedure: OFF PUMP CORONARY ARTERY BYPASS GRAFTING (CABG) TIMES ONE;  Surgeon: Corliss Skains, MD;  Location: [facility] OR;  Service: Open Heart Surgery;  Laterality: N/A;  swan only  . RIGHT/LEFT HEART CATH AND CORONARY ANGIOGRAPHY N/A 01/26/2020   Procedure:  RIGHT/LEFT HEART CATH AND CORONARY ANGIOGRAPHY;  Surgeon: Dolores Patty, MD;  Location: [facility] INVASIVE CV LAB;  Service: Cardiovascular;  Laterality: N/A;  . TEE WITHOUT CARDIOVERSION N/A 01/30/2020   Procedure: TRANSESOPHAGEAL ECHOCARDIOGRAM (TEE);  Surgeon: Corliss Skains, MD;  Location: [facility] OR;  Service: Open Heart Surgery;  Laterality: N/A;  . TEE WITHOUT CARDIOVERSION N/A 02/06/2020   Procedure: TRANSESOPHAGEAL ECHOCARDIOGRAM (TEE);  Surgeon: Dolores Patty, MD;  Location: [facility] ENDOSCOPY;  Service: Cardiovascular;  Laterality: N/A;     Current Facility-Administered Medications  Medication Dose Route Frequency Provider Last Rate Last Admin  . 0.9 %  sodium chloride infusion   Intravenous Continuous Regan Lemming, MD 50 mL/hr at 09/24/20 1146 New Bag at 09/24/20 1146  . ceFAZolin (ANCEF) IVPB 2g/100 mL premix  2 g Intravenous To Cath Royann Wildasin Daphine Deutscher, MD      . chlorhexidine (HIBICLENS) 4 % liquid 4 application  4 application Topical Once Alondria Mousseau Daphine Deutscher, MD      . gentamicin (GARAMYCIN) 80 mg in sodium chloride 0.9 % 500 mL irrigation  80 mg Irrigation To Cath Evvie Behrmann, Andree Coss, MD        Allergies:   Other   Social History:  The patient  reports that he has never smoked. He has never used smokeless tobacco. He reports current alcohol use.   Family History:  The patient's family history includes Gout in his sister.   ROS:  Please see the history of present illness.   Otherwise, review of systems is positive for none.   All other systems are reviewed and negative.   PHYSICAL EXAM: VS:  BP 116/84   Pulse 88   Temp (!) 97.5 F (36.4 C) (Oral)   Resp 16   Ht 6' (1.829 m)   Wt 100.2 kg   SpO2 99%   BMI 29.97 kg/m  , BMI Body mass index is 29.97 kg/m. GEN: Well nourished, well developed, in no acute distress  HEENT: normal  Neck: no JVD, carotid bruits, or masses Cardiac: RRR; no murmurs, rubs, or gallops,no edema  Respiratory:  clear to auscultation  bilaterally, normal work of breathing GI: soft, nontender, nondistended, + BS MS: no deformity or atrophy  Skin: warm and dry Neuro:  Strength and sensation are intact Psych: euthymic mood, full affect    Recent Labs: 03/12/2020: ALT 28; B Natriuretic Peptide 533.8; TSH 4.165 03/14/2020: Magnesium 2.1 09/24/2020: BUN 17; Creatinine, Ser 1.18; Hemoglobin 15.4; Platelets 193; Potassium 4.4; Sodium 140    Lipid Panel     Component Value Date/Time   CHOL 169 06/25/2020 1156   TRIG 82 06/25/2020 1156   HDL 46 06/25/2020 1156   CHOLHDL 3.7 06/25/2020 1156   VLDL 16 06/25/2020 1156   LDLCALC 107 (H) 06/25/2020 1156     Wt Readings from Last 3 Encounters:  09/24/20 100.2 kg  08/29/20 101.2 kg  07/23/20 98.9 kg      Other studies Reviewed: Additional studies/ records that were reviewed today include: TTE 06/25/2020 Review of the above records today demonstrates:  1. Global hypokinesis worse in the septal myocardium. Left ventricular  ejection fraction, by estimation, is 25 to 30%. The left ventricle has  severely decreased function. The left ventricle demonstrates global  hypokinesis. Left ventricular diastolic  parameters are consistent with Grade I diastolic dysfunction (impaired  relaxation).  2. Right ventricular systolic function is moderately reduced. The right  ventricular size is normal.  3. The mitral valve is normal in structure. No evidence of mitral valve  regurgitation. No evidence of mitral stenosis.  4. The aortic valve is tricuspid. Aortic valve regurgitation is not  visualized. No aortic stenosis is present.  5. The inferior vena cava is normal in size with greater than 50%  respiratory variability, suggesting right atrial pressure of 3 mmHg.    ASSESSMENT AND PLAN:  1.  Typical atrial flutter: Status post ablation 03/15/2020.  Currently not anticoagulated as he has had no recurrences.    2.  Nonischemic cardiomyopathy:  ICD Criteria  Current  LVEF:33%. Within 12 months prior to implant: Yes   Heart failure history: Yes, Class II  Cardiomyopathy history: Yes, Non-Ischemic Cardiomyopathy.  Atrial Fibrillation/Atrial Flutter: No.  Ventricular tachycardia history: No.  Cardiac arrest history: No.  History of syndromes with risk of sudden death: No.  Previous ICD: No.  Current ICD indication: Primary  PPM indication: No.  Class I or II Bradycardia indication present: No  Beta Blocker therapy for 3 or more months: Yes, prescribed.   Ace Inhibitor/ARB therapy for 3 or more months: Yes, prescribed.    I have seen Corey Brady is a 64 y.o. malepre-procedural and has been referred by Bensomhon for consideration of ICD implant for primary prevention of sudden death.  The patient's chart has been reviewed and they meet criteria for ICD implant.  I have had a thorough discussion with the patient reviewing options.  The patient and their family (if available) have had opportunities to ask questions and have them answered. The patient and I have decided together through the [facility] Heart Care Share Decision Support Tool to implant ICD at this time.  Risks, benefits, alternatives to ICD implantation were discussed in detail with the patient today. The patient  understands that the risks include but are not limited to bleeding, infection, pneumothorax, perforation, tamponade, vascular damage, renal failure, MI, stroke, death, inappropriate shocks, and lead dislodgement and  wishes to proceed.

## 2020-09-24 NOTE — Progress Notes (Signed)
Patient was given discharge instructions. He verbalized understanding. 

## 2020-09-25 ENCOUNTER — Telehealth: Payer: Self-pay

## 2020-09-25 ENCOUNTER — Encounter (HOSPITAL_COMMUNITY): Payer: Self-pay | Admitting: Cardiology

## 2020-09-25 NOTE — Telephone Encounter (Signed)
Follow-up after same day discharge: Implant date: 09/24/20 MD: Loman Brooklyn, MD Device: ICD Location: Left chest    Wound check visit: 10/08/20 90 day MD follow-up: 01/02/21  Remote Transmission received:Today  Dressing removed: Not yet, patient educated to remove outer dressing today, leave steri strips in tact.  S/s of infection to monitor for and keep steri strips dry until wound check.

## 2020-10-01 NOTE — Progress Notes (Signed)
Advanced Heart Failure Clinic Note   Referring Physician: PCP: Marcine Matar, MD HF Cardiologist:  [name]   HPI:  Mr. Mckone 65 y/o male w/ CAD, severe systolic HF and PAFL. Underwent CABG 5/21. Post-op course c/b low output and PAFL requiring several DC-CV. Was placed on amiodarone.    Readmitted for HF on 6/21 for a/c CHF w/ volume overload, recurrent AFL and bilateral LE cellulitis.    He was loaded on IV amiodarone, started on IV lasix for CHF and started on IV vanc for cellulitis. No signs of systemic infection. WBC remained normal and he remained AF. Abx later changed to PO doxycycline. Had good diuresis w/ IV Lasix and changed back to PO diuretics. EP was consulted for AFL and he underwent  AFL ablation6/19/21. Post ablation had periods of junctional rhythm. Dig and amio held and junctional rhythm resolved. EP recommended avoidance of rate control medications. No indication for PPM at this time. Was discharged home on 6/21. D/c wt was 215 lb. He is being followed by paramedicine.   He had EP f/u w/ [name] 04/25/20 and was in NSR. Eliquis was discontinued and low dose metoprolol was added. He has done well since and has been followed closely and meds optimized.   He underwent ICD implant on 09/24/20  He returns today for f/u. He is feeling well. No CP, dizziness, palpitations, edema or PND/orthopnea. No increasing SOB, able to get out of the house and rake leaves. Taking lasix daily now. Weights stable at home~218 lbs. Taking all medications. HR at home ~60-110 bpm.    Past Medical History:  Diagnosis Date  . CHF (congestive heart failure) (HCC)   . Gout   . No blood products 01/27/2020    Current Outpatient Medications  Medication Sig Dispense Refill  . apixaban (ELIQUIS) 5 MG TABS tablet Take 5 mg by mouth 2 (two) times daily.    Marland Kitchen aspirin 81 MG chewable tablet Chew 1 tablet (81 mg total) by mouth daily.    Marland Kitchen atorvastatin (LIPITOR) 20 MG tablet Take 1  tablet (20 mg total) by mouth daily. 90 tablet 3  . dapagliflozin propanediol (FARXIGA) 10 MG TABS tablet Take 1 tablet (10 mg total) by mouth daily before breakfast. 30 tablet 6  . furosemide (LASIX) 40 MG tablet Take 40 mg by mouth daily.    Marland Kitchen losartan (COZAAR) 25 MG tablet Take 1 tablet (25 mg total) by mouth daily. 90 tablet 2  . metoprolol succinate (TOPROL XL) 25 MG 24 hr tablet Take 1 tablet (25 mg total) by mouth daily. 30 tablet 11  . sacubitril-valsartan (ENTRESTO) 97-103 MG Take 1 tablet by mouth 2 (two) times daily. 60 tablet 6  . spironolactone (ALDACTONE) 25 MG tablet Take 0.5 tablets (12.5 mg total) by mouth daily. 15 tablet 6   No current facility-administered medications for this encounter.    Allergies  Allergen Reactions  . Other Other (See Comments)    No blood products      Social History   Socioeconomic History  . Marital status: Single    Spouse name: Not on file  . Number of children: Not on file  . Years of education: Not on file  . Highest education level: Not on file  Occupational History  . Not on file  Tobacco Use  . Smoking status: Never Smoker  . Smokeless tobacco: Never Used  Substance and Sexual Activity  . Alcohol use: Yes  . Drug use: Not on file  .  Sexual activity: Not on file  Other Topics Concern  . Not on file  Social History Narrative  . Not on file   Social Determinants of Health   Financial Resource Strain: Low Risk   . Difficulty of Paying Living Expenses: Not very hard  Food Insecurity: No Food Insecurity  . Worried About Programme researcher, broadcasting/film/video in the Last Year: Never true  . Ran Out of Food in the Last Year: Never true  Transportation Needs: No Transportation Needs  . Lack of Transportation (Medical): No  . Lack of Transportation (Non-Medical): No  Physical Activity: Not on file  Stress: Not on file  Social Connections: Not on file  Intimate Partner Violence: Not on file     Family History  Problem Relation Age of  Onset  . Gout Sister     Vitals:   10/02/20 1345  Pulse: (!) 117  SpO2: 96%  Weight: 100.5 kg    Wt Readings from Last 3 Encounters:  10/02/20 100.5 kg  09/24/20 100.2 kg  08/29/20 101.2 kg    PHYSICAL EXAM: General:  Well appearing. No resp difficulty HEENT: normal Neck: supple. no JVD. Carotids 2+ bilat; no bruits. No lymphadenopathy or thryomegaly appreciated. Cor: PMI nondisplaced. Regular rate & rhythm. No rubs, gallops or murmurs. Hematoma to ICD site.  Lungs: clear Abdomen: soft, nontender, nondistended. No hepatosplenomegaly. No bruits or masses. Good bowel sounds. Extremities: no cyanosis, clubbing, rash, edema Neuro: alert & oriented x 3, cranial nerves grossly intact. moves all 4 extremities w/o difficulty. Affect pleasant   ECG: ST 118 bpm (personally reviewed). ICD interrogation: recently placed. no VT (personally reviewed). Personally reviewed   ASSESSMENT & PLAN: 1. Chronic Systolic Heart Failure - ECHO 5/46 EF 15% with severe global HK, and moderate RV dysfunction. Etiology possible ETOH versus HTN (denies severe ETOH use). TSH ok. HIV NR  - 4/28 LHC with severe 1v CAD. RHC with preserved cardiac output.  - S/p CABG x 1 w/ LIMA-LAD 01/30/20 - Echo 6/21, LVEF 25-30%, RV mildly reduced.  - Echo 9/21 EF 25-30%  - s/p ICD 12/21. - NYHAII,euvolemic on exam.  - Continue Entresto 97-103 mg bid.  - Continue Farxiga 10 mg daily.  - Continue spiro 12.5 mg daily.  - Continue lasix 40 mg daily. - Continue Toprol XL 25 mg daily (started by EP, tolerating ok). - Patient has been taking losartan w/ Entresto. Stop losartan. - No digoxin given recent junctional bradycardia.  - Recent labs stable.  2. CAD:  - LHC 12/2019 with severe 1V CAD. - S/p CABG x 1 w/ LIMA-LAD 01/30/20. - Stable w/o anginal symptoms.  - on ASA 81 mg and atorva 20. - continue Toprol XL 25 mg daily.   3. H/o Atrial Flutter  - s/p ablation 6/21 by [name]. Periods of junctional rhythm post  ablation- off amio and dig -> improved. Tolerating low dose  blocker ok. Now s/p ICD - Maintaining SR on EKG today. HR fast 118 bpm.  - Eliquis discontinued by [name] 7/21. Patient currently taking Eliquis. - Place Zio patch for 7 days for HR variability and to make sure he is not having Aflutter episodes. - Continue Eliquis for now until results of Zio  4. Hematoma of ICD site - Hold Eliquis for 3 days. - Follow up with EP, has wound check on 10/08/20.   Arvilla Meres, MD 10/02/20

## 2020-10-02 ENCOUNTER — Other Ambulatory Visit (HOSPITAL_COMMUNITY): Payer: Self-pay | Admitting: Internal Medicine

## 2020-10-02 ENCOUNTER — Other Ambulatory Visit: Payer: Self-pay

## 2020-10-02 ENCOUNTER — Encounter (HOSPITAL_COMMUNITY): Payer: Self-pay | Admitting: Internal Medicine

## 2020-10-02 ENCOUNTER — Ambulatory Visit (HOSPITAL_COMMUNITY)
Admission: RE | Admit: 2020-10-02 | Discharge: 2020-10-02 | Disposition: A | Discharge status: Home / Self Care | Payer: Medicaid Other | Source: Ambulatory Visit | Attending: Internal Medicine | Admitting: Internal Medicine

## 2020-10-02 VITALS — BP 110/70 | HR 117 | Wt 221.6 lb

## 2020-10-02 DIAGNOSIS — Z7984 Long term (current) use of oral hypoglycemic drugs: Secondary | ICD-10-CM | POA: Diagnosis not present

## 2020-10-02 DIAGNOSIS — Z7982 Long term (current) use of aspirin: Secondary | ICD-10-CM | POA: Insufficient documentation

## 2020-10-02 DIAGNOSIS — Z7901 Long term (current) use of anticoagulants: Secondary | ICD-10-CM | POA: Diagnosis not present

## 2020-10-02 DIAGNOSIS — I4892 Unspecified atrial flutter: Secondary | ICD-10-CM | POA: Insufficient documentation

## 2020-10-02 DIAGNOSIS — Z95810 Presence of automatic (implantable) cardiac defibrillator: Secondary | ICD-10-CM | POA: Insufficient documentation

## 2020-10-02 DIAGNOSIS — R002 Palpitations: Secondary | ICD-10-CM

## 2020-10-02 DIAGNOSIS — Y838 Other surgical procedures as the cause of abnormal reaction of the patient, or of later complication, without mention of misadventure at the time of the procedure: Secondary | ICD-10-CM | POA: Diagnosis not present

## 2020-10-02 DIAGNOSIS — I2510 Atherosclerotic heart disease of native coronary artery without angina pectoris: Secondary | ICD-10-CM | POA: Diagnosis not present

## 2020-10-02 DIAGNOSIS — Z79899 Other long term (current) drug therapy: Secondary | ICD-10-CM | POA: Diagnosis not present

## 2020-10-02 DIAGNOSIS — I5022 Chronic systolic (congestive) heart failure: Secondary | ICD-10-CM | POA: Insufficient documentation

## 2020-10-02 DIAGNOSIS — Z951 Presence of aortocoronary bypass graft: Secondary | ICD-10-CM | POA: Diagnosis not present

## 2020-10-02 DIAGNOSIS — L7632 Postprocedural hematoma of skin and subcutaneous tissue following other procedure: Secondary | ICD-10-CM | POA: Diagnosis not present

## 2020-10-02 NOTE — Progress Notes (Signed)
Zio patch placed onto patient.  All instructions and information reviewed with patient, they verbalize understanding with no questions. 

## 2020-10-02 NOTE — Patient Instructions (Signed)
STOP Losartan  Ok to hold Eliquis for 3 days  Your provider has recommended that  you wear a Zio Patch for 7 days.  This monitor will record your heart rhythm for our review.  IF you have any symptoms while wearing the monitor please press the button.  If you have any issues with the patch or you notice a red or orange light on it please call the company at [phone].  Once you remove the patch please mail it back to the company as soon as possible so we can get the results.  Your physician recommends that you schedule a follow-up appointment in: 4 months  If you have any questions or concerns before your next appointment please send [place] a message through [place] or call our office at [phone].    TO LEAVE A MESSAGE FOR THE NURSE SELECT OPTION 2, PLEASE LEAVE A MESSAGE INCLUDING: . YOUR NAME . DATE OF BIRTH . CALL BACK NUMBER . REASON FOR CALL**this is important as we prioritize the call backs  YOU WILL RECEIVE A CALL BACK THE SAME DAY AS LONG AS YOU CALL BEFORE 4:00 PM  At the [facility], you and your health needs are our priority. As part of our continuing mission to provide you with exceptional heart care, we have created designated Provider Care Teams. These Care Teams include your primary Cardiologist (physician) and Advanced Practice Providers (APPs- Physician Assistants and Nurse Practitioners) who all work together to provide you with the care you need, when you need it.   You may see any of the following providers on your designated Care Team at your next follow up: Marland Kitchen Dr Arvilla Meres . Dr Marca Ancona . Tonye Becket, NP . Robbie Lis, PA . Karle Plumber, PharmD   Please be sure to bring in all your medications bottles to every appointment.

## 2020-10-05 ENCOUNTER — Other Ambulatory Visit (HOSPITAL_COMMUNITY): Payer: Self-pay | Admitting: Cardiology

## 2020-10-07 ENCOUNTER — Telehealth: Payer: Self-pay | Admitting: Internal Medicine

## 2020-10-07 ENCOUNTER — Telehealth: Payer: Self-pay | Admitting: Cardiology

## 2020-10-07 ENCOUNTER — Other Ambulatory Visit (HOSPITAL_COMMUNITY): Payer: Self-pay | Admitting: *Deleted

## 2020-10-07 NOTE — Telephone Encounter (Signed)
Pt c/o medication issue:  1. Name of Medication: sacubitril-valsartan (ENTRESTO) 97-103 MG  2. How are you currently taking this medication (dosage and times per day)? Question regarding this  3. Are you having a reaction (difficulty breathing--STAT)? no  4. What is your medication issue? Per Brother, Patient was discharged from the hospital and the dosage on this medication was 25mg . He wants to know if his brother should be taking this beings that it is a lower dosage. Please advise.

## 2020-10-07 NOTE — Telephone Encounter (Signed)
Called and spoke with patient. Pt had medication filled at a different pharmacy and they filled entresto 24/26mg . Pt is supposed to take entresto 91/103mg  bid. Pt aware and will pick up correct script.

## 2020-10-08 ENCOUNTER — Other Ambulatory Visit: Payer: Self-pay

## 2020-10-08 ENCOUNTER — Ambulatory Visit (INDEPENDENT_AMBULATORY_CARE_PROVIDER_SITE_OTHER): Payer: Medicaid Other | Admitting: Emergency Medicine

## 2020-10-08 DIAGNOSIS — I428 Other cardiomyopathies: Secondary | ICD-10-CM | POA: Diagnosis not present

## 2020-10-08 LAB — CUP PACEART INCLINIC DEVICE CHECK
Battery Remaining Longevity: 137 mo
Battery Voltage: 3.16 V
Brady Statistic RV Percent Paced: 0 %
Date Time Interrogation Session: 20220111143045
HighPow Impedance: 60 Ohm
Implantable Lead Implant Date: 20211228
Implantable Lead Location: 753860
Implantable Pulse Generator Implant Date: 20211228
Lead Channel Impedance Value: 494 Ohm
Lead Channel Impedance Value: 570 Ohm
Lead Channel Pacing Threshold Amplitude: 0.5 V
Lead Channel Pacing Threshold Pulse Width: 0.4 ms
Lead Channel Sensing Intrinsic Amplitude: 31.625 mV
Lead Channel Setting Pacing Amplitude: 3.5 V
Lead Channel Setting Pacing Pulse Width: 0.4 ms
Lead Channel Setting Sensing Sensitivity: 0.3 mV

## 2020-10-08 NOTE — Progress Notes (Signed)
Wound check appointment. Steri-strips removed. Wound without redness. Hematoma noted at ICD site. Firm to palpitate, no warmth or drainage noted. [name] in to assess, VO to hold Eliquis for 5 days then resume. Verbal instructions given to patient with verbal understanding.  Advised to use ice as well to help decrease swelling, use barrier between skin. Incision edges approximated, wound well healed. Normal device function. Thresholds, sensing, and impedances consistent with implant measurements. Device programmed at 3.5V for extra safety margin until 3 month visit. Histogram distribution appropriate for patient and level of activity. No ventricular arrhythmias noted. Patient educated about wound care, arm mobility, lifting restrictions, shock plan. ROV in 3 months with implanting physician. Next home remote 12/25/20.

## 2020-10-08 NOTE — Patient Instructions (Signed)
Please continue to monitor for any signs of infection such as increased swelling, drainage, fever or chills. Please call the device clinic if you see any.  Device Clinic [phone]  Please do not take your Eliquis for the next 5 days. You may start back on 10/14/20

## 2020-10-21 ENCOUNTER — Other Ambulatory Visit (HOSPITAL_COMMUNITY): Payer: Self-pay | Admitting: Cardiology

## 2020-10-24 ENCOUNTER — Other Ambulatory Visit (HOSPITAL_COMMUNITY): Payer: Self-pay | Admitting: Cardiology

## 2020-10-24 ENCOUNTER — Ambulatory Visit: Payer: Medicaid Other | Admitting: Cardiology

## 2020-10-26 ENCOUNTER — Telehealth: Payer: Self-pay | Admitting: Physician Assistant

## 2020-10-26 MED ORDER — FUROSEMIDE 40 MG PO TABS: 40.0000 mg | ORAL_TABLET | Freq: Every day | ORAL | 0 refills | Status: DC

## 2020-10-26 NOTE — Telephone Encounter (Signed)
   Pt called to request Lasix refill, has accidentally run out for the last 2 days. Doing well otherwise. He reports he is on Lasix 40mg  daily which corroborates with MAR, confirmed at last OV. His usual pharmacy is not open today so will send 30 day fill to his requested alternative [facility] at [facility]. He was appreciative of call back.  Will forward to CHF nurse to address further refills from their team.  [facility] PA-C

## 2020-10-28 ENCOUNTER — Other Ambulatory Visit (HOSPITAL_COMMUNITY): Payer: Self-pay | Admitting: *Deleted

## 2020-10-28 MED ORDER — FUROSEMIDE 40 MG PO TABS: 40.0000 mg | ORAL_TABLET | Freq: Every day | ORAL | 6 refills | Status: DC

## 2020-11-26 ENCOUNTER — Other Ambulatory Visit (HOSPITAL_COMMUNITY): Payer: Self-pay | Admitting: Internal Medicine

## 2020-11-27 ENCOUNTER — Other Ambulatory Visit: Payer: Self-pay | Admitting: Physician Assistant

## 2020-11-28 ENCOUNTER — Other Ambulatory Visit (HOSPITAL_COMMUNITY): Payer: Self-pay | Admitting: *Deleted

## 2020-11-28 MED ORDER — APIXABAN 5 MG PO TABS: 5.0000 mg | ORAL_TABLET | Freq: Two times a day (BID) | ORAL | 3 refills | Status: DC

## 2020-11-28 MED ORDER — SPIRONOLACTONE 25 MG PO TABS: ORAL_TABLET | ORAL | 3 refills | Status: DC

## 2020-11-28 MED ORDER — ATORVASTATIN CALCIUM 20 MG PO TABS: 20.0000 mg | ORAL_TABLET | Freq: Every day | ORAL | 3 refills | Status: DC

## 2020-11-28 MED ORDER — SACUBITRIL-VALSARTAN 97-103 MG PO TABS: 1.0000 | ORAL_TABLET | Freq: Two times a day (BID) | ORAL | 6 refills | Status: DC

## 2020-11-28 MED ORDER — METOPROLOL SUCCINATE ER 25 MG PO TB24: 25.0000 mg | ORAL_TABLET | Freq: Every day | ORAL | 11 refills | Status: DC

## 2020-12-25 ENCOUNTER — Ambulatory Visit (INDEPENDENT_AMBULATORY_CARE_PROVIDER_SITE_OTHER): Payer: Medicaid Other

## 2020-12-25 DIAGNOSIS — I428 Other cardiomyopathies: Secondary | ICD-10-CM

## 2020-12-25 LAB — CUP PACEART REMOTE DEVICE CHECK
Battery Remaining Longevity: 136 mo
Battery Voltage: 3.15 V
Brady Statistic RV Percent Paced: 0.01 %
Date Time Interrogation Session: 20220330043823
HighPow Impedance: 54 Ohm
Implantable Lead Implant Date: 20211228
Implantable Lead Location: 753860
Implantable Pulse Generator Implant Date: 20211228
Lead Channel Impedance Value: 437 Ohm
Lead Channel Impedance Value: 494 Ohm
Lead Channel Pacing Threshold Amplitude: 0.5 V
Lead Channel Pacing Threshold Pulse Width: 0.4 ms
Lead Channel Sensing Intrinsic Amplitude: 24.625 mV
Lead Channel Sensing Intrinsic Amplitude: 24.625 mV
Lead Channel Setting Pacing Amplitude: 2 V
Lead Channel Setting Pacing Pulse Width: 0.4 ms
Lead Channel Setting Sensing Sensitivity: 0.3 mV

## 2021-01-02 ENCOUNTER — Encounter: Payer: Self-pay | Admitting: Cardiology

## 2021-01-02 ENCOUNTER — Ambulatory Visit (INDEPENDENT_AMBULATORY_CARE_PROVIDER_SITE_OTHER): Payer: Medicaid Other | Admitting: Cardiology

## 2021-01-02 ENCOUNTER — Other Ambulatory Visit: Payer: Self-pay

## 2021-01-02 VITALS — BP 112/76 | HR 76 | Ht 72.0 in | Wt 231.2 lb

## 2021-01-02 DIAGNOSIS — I428 Other cardiomyopathies: Secondary | ICD-10-CM

## 2021-01-02 DIAGNOSIS — I2510 Atherosclerotic heart disease of native coronary artery without angina pectoris: Secondary | ICD-10-CM | POA: Diagnosis not present

## 2021-01-02 DIAGNOSIS — I483 Typical atrial flutter: Secondary | ICD-10-CM | POA: Diagnosis not present

## 2021-01-02 NOTE — Progress Notes (Signed)
Electrophysiology Office Note   Date:  01/02/2021   ID:  Corey Brady, DOB Mar 12, 1956, MRN [id number]  PCP:  Marcine Matar, MD  Cardiologist:  Bensimhon Primary Electrophysiologist:  [name], MD    Chief Complaint: atrial flutter   History of Present Illness: Corey Brady is a 65 y.o. male who is being seen today for the evaluation of atrial flutter at the request of Bensimhon. Presenting today for electrophysiology evaluation.  He has a history significant for alcohol abuse with possible alcoholic cardiomyopathy, hypertension, atrial flutter, and coronary artery disease.  He is status post single-vessel CABG.  He is status post atrial flutter ablation 03/15/2020.  He had a Medtronic ICD implanted 09/24/2020.  Today, denies symptoms of palpitations, chest pain, shortness of breath, orthopnea, PND, lower extremity edema, claudication, dizziness, presyncope, syncope, bleeding, or neurologic sequela. The patient is tolerating medications without difficulties.  He is currently feeling well.  He has no chest pain or shortness of breath.  He is able do all of his daily activities and is without restriction.   Past Medical History:  Diagnosis Date  . CHF (congestive heart failure) (HCC)   . Gout   . No blood products 01/27/2020   Past Surgical History:  Procedure Laterality Date  . A-FLUTTER ABLATION N/A 03/15/2020   Procedure: A-FLUTTER ABLATION;  Surgeon: Regan Lemming, MD;  Location: [facility] INVASIVE CV LAB;  Service: Cardiovascular;  Laterality: N/A;  . CARDIOVERSION N/A 02/06/2020   Procedure: CARDIOVERSION;  Surgeon: Dolores Patty, MD;  Location: [facility] ENDOSCOPY;  Service: Cardiovascular;  Laterality: N/A;  . CORONARY ARTERY BYPASS GRAFT N/A 01/30/2020   Procedure: OFF PUMP CORONARY ARTERY BYPASS GRAFTING (CABG) TIMES ONE;  Surgeon: Corliss Skains, MD;  Location: [facility] OR;  Service: Open Heart Surgery;  Laterality: N/A;  swan only  . ICD IMPLANT N/A 09/24/2020    Procedure: ICD IMPLANT;  Surgeon: Regan Lemming, MD;  Location: [facility] INVASIVE CV LAB;  Service: Cardiovascular;  Laterality: N/A;  . RIGHT/LEFT HEART CATH AND CORONARY ANGIOGRAPHY N/A 01/26/2020   Procedure: RIGHT/LEFT HEART CATH AND CORONARY ANGIOGRAPHY;  Surgeon: Dolores Patty, MD;  Location: [facility] INVASIVE CV LAB;  Service: Cardiovascular;  Laterality: N/A;  . TEE WITHOUT CARDIOVERSION N/A 01/30/2020   Procedure: TRANSESOPHAGEAL ECHOCARDIOGRAM (TEE);  Surgeon: Corliss Skains, MD;  Location: [facility] OR;  Service: Open Heart Surgery;  Laterality: N/A;  . TEE WITHOUT CARDIOVERSION N/A 02/06/2020   Procedure: TRANSESOPHAGEAL ECHOCARDIOGRAM (TEE);  Surgeon: Dolores Patty, MD;  Location: [facility] ENDOSCOPY;  Service: Cardiovascular;  Laterality: N/A;     Current Outpatient Medications  Medication Sig Dispense Refill  . apixaban (ELIQUIS) 5 MG TABS tablet Take 1 tablet (5 mg total) by mouth 2 (two) times daily. 60 tablet 3  . aspirin 81 MG chewable tablet Chew 1 tablet (81 mg total) by mouth daily.    Marland Kitchen atorvastatin (LIPITOR) 20 MG tablet Take 1 tablet (20 mg total) by mouth daily. 90 tablet 3  . dapagliflozin propanediol (FARXIGA) 10 MG TABS tablet Take 1 tablet (10 mg total) by mouth daily before breakfast. 30 tablet 6  . furosemide (LASIX) 40 MG tablet TAKE 1 TABLET(40 MG) BY MOUTH DAILY 30 tablet 6  . metoprolol succinate (TOPROL XL) 25 MG 24 hr tablet Take 1 tablet (25 mg total) by mouth daily. 30 tablet 11  . sacubitril-valsartan (ENTRESTO) 97-103 MG Take 1 tablet by mouth 2 (two) times daily. 60 tablet 6  . spironolactone (ALDACTONE) 25 MG tablet  TAKE 1/2 TABLET(12.5 MG) BY MOUTH DAILY 45 tablet 3   No current facility-administered medications for this visit.    Allergies:   Other   Social History:  The patient  reports that he has never smoked. He has never used smokeless tobacco. He reports current alcohol use.   Family History:  The patient's family history includes Gout in his  sister.   ROS:  Please see the history of present illness.   Otherwise, review of systems is positive for none.   All other systems are reviewed and negative.   PHYSICAL EXAM: VS:  BP 112/76   Pulse 76   Ht 6' (1.829 m)   Wt 231 lb 3.2 oz (104.9 kg)   SpO2 97%   BMI 31.36 kg/m  , BMI Body mass index is 31.36 kg/m. GEN: Well nourished, well developed, in no acute distress  HEENT: normal  Neck: no JVD, carotid bruits, or masses Cardiac: RRR; no murmurs, rubs, or gallops,no edema  Respiratory:  clear to auscultation bilaterally, normal work of breathing GI: soft, nontender, nondistended, + BS MS: no deformity or atrophy  Skin: warm and dry, device site well healed Neuro:  Strength and sensation are intact Psych: euthymic mood, full affect  EKG:  EKG is ordered today. Personal review of the ekg ordered shows sinus rhythm, rate 76  Personal review of the device interrogation today. Results in [facility]   Recent Labs: 03/12/2020: ALT 28; B Natriuretic Peptide 533.8; TSH 4.165 03/14/2020: Magnesium 2.1 09/24/2020: BUN 17; Creatinine, Ser 1.18; Hemoglobin 15.4; Platelets 193; Potassium 4.4; Sodium 140    Lipid Panel     Component Value Date/Time   CHOL 169 06/25/2020 1156   TRIG 82 06/25/2020 1156   HDL 46 06/25/2020 1156   CHOLHDL 3.7 06/25/2020 1156   VLDL 16 06/25/2020 1156   LDLCALC 107 (H) 06/25/2020 1156     Wt Readings from Last 3 Encounters:  01/02/21 231 lb 3.2 oz (104.9 kg)  10/02/20 221 lb 9.6 oz (100.5 kg)  09/24/20 221 lb (100.2 kg)      Other studies Reviewed: Additional studies/ records that were reviewed today include: TTE 06/25/2020 Review of the above records today demonstrates:  1. Global hypokinesis worse in the septal myocardium. Left ventricular  ejection fraction, by estimation, is 25 to 30%. The left ventricle has  severely decreased function. The left ventricle demonstrates global  hypokinesis. Left ventricular diastolic  parameters are  consistent with Grade I diastolic dysfunction (impaired  relaxation).  2. Right ventricular systolic function is moderately reduced. The right  ventricular size is normal.  3. The mitral valve is normal in structure. No evidence of mitral valve  regurgitation. No evidence of mitral stenosis.  4. The aortic valve is tricuspid. Aortic valve regurgitation is not  visualized. No aortic stenosis is present.  5. The inferior vena cava is normal in size with greater than 50%  respiratory variability, suggesting right atrial pressure of 3 mmHg.   Cardiac monitor 11/02/2020 personally reviewed 1. Sinus rhythm - Patient had a min HR of 57 bpm, max HR of 207 bpm, and avg HR of 95 bpm.  2. Eight runs of SVT occurred, the run with the fastest interval lasting 6 beats with a max rate of 207 bpm, the longest lasting 15 beats with an avg rate of 135 bpm. 3. Isolated PACs were frequent (5.0%, 49318) 4. PACs were rare (< 1.0%)   ASSESSMENT AND PLAN:  1.  Typical atrial flutter: Status post ablation  03/15/2020.  Currently not anticoagulated.  No recurrence.  2.  Nonischemic cardiomyopathy: Possibly due to alcoholic cardiomyopathy, but does have one-vessel coronary artery disease so could possibly be ischemic.  Currently on Entresto, Aldactone, Toprol-XL, Farxiga.  Is now status post Medtronic ICD implanted 09/24/2020.  Device functioning appropriately.  No changes.   3.  Coronary artery disease: Left heart catheterization with severe one-vessel disease.  Status post one-vessel CABG with a LIMA to the LAD.  Continue aspirin and atorvastatin per primary cardiology.     Current medicines are reviewed at length with the patient today.   The patient does not have concerns regarding his medicines.  The following changes were made today: none  Labs/ tests ordered today include:  Orders Placed This Encounter  Procedures  . EKG 12-Lead     Disposition:   FU with Hebah Bogosian 9 months  Signed, Delyle Weider  Jorja Loa, MD  01/02/2021 2:05 PM     [facility] [address] [place] [address] [phone] (office) [phone] (fax)

## 2021-01-02 NOTE — Patient Instructions (Signed)
Medication Instructions:  Your physician recommends that you continue on your current medications as directed. Please refer to the Current Medication list given to you today.  Labwork: None ordered.  Testing/Procedures: None ordered.  Follow-Up: Your physician wants you to follow-up in: 9 months with Loman Brooklyn, MD or one of the following Advanced Practice Providers on your designated Care Team:    Gypsy Balsam, NP  Francis Dowse, PA-C  Casimiro Needle "[place]" [place], [place]   You will receive a reminder letter in the mail two months in advance. If you don't receive a letter, please call our office to schedule the follow-up appointment.  Remote monitoring is used to monitor your ICD from home. This monitoring reduces the number of office visits required to check your device to one time per year. It allows [place] to keep an eye on the functioning of your device to ensure it is working properly. You are scheduled for a device check from home on 03/26/21. You may send your transmission at any time that day. If you have a wireless device, the transmission will be sent automatically. After your physician reviews your transmission, you will receive a postcard with your next transmission date.  Any Other Special Instructions Will Be Listed Below (If Applicable).  If you need a refill on your cardiac medications before your next appointment, please call your pharmacy.

## 2021-01-06 ENCOUNTER — Other Ambulatory Visit (HOSPITAL_COMMUNITY): Payer: Self-pay | Admitting: Cardiology

## 2021-01-07 NOTE — Progress Notes (Signed)
Remote ICD transmission.   

## 2021-01-20 ENCOUNTER — Other Ambulatory Visit (HOSPITAL_COMMUNITY): Payer: Self-pay | Admitting: Cardiology

## 2021-02-07 ENCOUNTER — Other Ambulatory Visit (HOSPITAL_COMMUNITY): Payer: Self-pay

## 2021-02-07 MED ORDER — APIXABAN 5 MG PO TABS: 5.0000 mg | ORAL_TABLET | Freq: Two times a day (BID) | ORAL | 11 refills | Status: DC

## 2021-02-12 ENCOUNTER — Other Ambulatory Visit: Payer: Self-pay

## 2021-02-12 ENCOUNTER — Ambulatory Visit (HOSPITAL_COMMUNITY)
Admission: RE | Admit: 2021-02-12 | Discharge: 2021-02-12 | Disposition: A | Discharge status: Home / Self Care | Payer: Medicaid Other | Source: Ambulatory Visit | Attending: Internal Medicine | Admitting: Internal Medicine

## 2021-02-12 ENCOUNTER — Encounter (HOSPITAL_COMMUNITY): Payer: Self-pay | Admitting: Internal Medicine

## 2021-02-12 VITALS — BP 110/78 | HR 80 | Wt 230.8 lb

## 2021-02-12 DIAGNOSIS — Z7901 Long term (current) use of anticoagulants: Secondary | ICD-10-CM | POA: Insufficient documentation

## 2021-02-12 DIAGNOSIS — Z95810 Presence of automatic (implantable) cardiac defibrillator: Secondary | ICD-10-CM | POA: Insufficient documentation

## 2021-02-12 DIAGNOSIS — I5022 Chronic systolic (congestive) heart failure: Secondary | ICD-10-CM | POA: Diagnosis present

## 2021-02-12 DIAGNOSIS — Z951 Presence of aortocoronary bypass graft: Secondary | ICD-10-CM | POA: Diagnosis not present

## 2021-02-12 DIAGNOSIS — I5023 Acute on chronic systolic (congestive) heart failure: Secondary | ICD-10-CM

## 2021-02-12 DIAGNOSIS — Z79899 Other long term (current) drug therapy: Secondary | ICD-10-CM | POA: Diagnosis not present

## 2021-02-12 DIAGNOSIS — I2510 Atherosclerotic heart disease of native coronary artery without angina pectoris: Secondary | ICD-10-CM | POA: Diagnosis not present

## 2021-02-12 DIAGNOSIS — Z7982 Long term (current) use of aspirin: Secondary | ICD-10-CM | POA: Insufficient documentation

## 2021-02-12 DIAGNOSIS — I4892 Unspecified atrial flutter: Secondary | ICD-10-CM | POA: Insufficient documentation

## 2021-02-12 LAB — LIPID PANEL
Cholesterol: 128 mg/dL (ref 0–200)
HDL: 41 mg/dL (ref >40)
LDL Cholesterol: 59 mg/dL (ref 0–99)
Total CHOL/HDL Ratio: 3.1 RATIO
Triglycerides: 139 mg/dL (ref <150)
VLDL: 28 mg/dL (ref 0–40)

## 2021-02-12 LAB — CBC
HCT: 48.1 % (ref 39.0–52.0)
Hemoglobin: 16.3 g/dL (ref 13.0–17.0)
MCH: 31.1 pg (ref 26.0–34.0)
MCHC: 33.9 g/dL (ref 30.0–36.0)
MCV: 91.8 fL (ref 80.0–100.0)
Platelets: 197 10*3/uL (ref 150–400)
RBC: 5.24 MIL/uL (ref 4.22–5.81)
RDW: 12.8 % (ref 11.5–15.5)
WBC: 6.7 10*3/uL (ref 4.0–10.5)
nRBC: 0 % (ref 0.0–0.2)

## 2021-02-12 LAB — BRAIN NATRIURETIC PEPTIDE: B Natriuretic Peptide: 81.5 pg/mL (ref 0.0–100.0)

## 2021-02-12 MED ORDER — METOPROLOL SUCCINATE ER 25 MG PO TB24: 50.0000 mg | ORAL_TABLET | Freq: Every day | ORAL | 11 refills | Status: DC

## 2021-02-12 NOTE — Addendum Note (Signed)
Encounter addended by: Crissie Figures, RN on: 02/12/2021 11:10 AM  Actions taken: Medication long-term status modified, Visit diagnoses modified, Order list changed, Diagnosis association updated

## 2021-02-12 NOTE — Patient Instructions (Signed)
  Good to see you! Labs today.  We will call with any abnormal values if needed. Increase Toprol to 50 mg daily Stop taking Eliquis Please Call in 6 month for follow-up with an Echocardiogram (November)

## 2021-02-12 NOTE — Progress Notes (Signed)
Advanced Heart Failure Clinic Note   Referring Physician: PCP: Marcine Matar, MD HF Cardiologist:  [name]   HPI:  Mr. Digilio is a 65 y/o male w/ CAD, severe systolic HF and PAFL. Underwent CABG 5/21. Post-op course c/b low output and PAFL requiring several DC-CV. Was placed on amiodarone.    Readmitted for HF 6/21 for a/c CHF w/ volume overload, recurrent AFL and bilateral LE cellulitis.    He was loaded on IV amiodarone, started on IV lasix for CHF and started on IV vanc for cellulitis. EP was consulted for AFL and he underwent  AFL ablation6/19/21. Post ablation had periods of junctional rhythm. Dig and amio held and junctional rhythm resolved.  He had EP f/u w/ [name] 04/25/20 and was in NSR. Eliquis was discontinued and low dose metoprolol was added. He has done well since and has been followed closely and meds optimized.   He underwent ICD implant on 09/24/20  He returns today for f/u. Says he feels good. No CP, SOB, edema or dizziness. No problems with his meds.     Past Medical History:  Diagnosis Date  . CHF (congestive heart failure) (HCC)   . Gout   . No blood products 01/27/2020    Current Outpatient Medications  Medication Sig Dispense Refill  . ENTRESTO 97-103 MG TAKE 1 TABLET BY MOUTH TWICE DAILY 60 tablet 8  . FARXIGA 10 MG TABS tablet TAKE 1 TABLET(10 MG) BY MOUTH DAILY BEFORE BREAKFAST 30 tablet 6  . apixaban (ELIQUIS) 5 MG TABS tablet Take 1 tablet (5 mg total) by mouth 2 (two) times daily. 60 tablet 11  . aspirin 81 MG chewable tablet Chew 1 tablet (81 mg total) by mouth daily.    Marland Kitchen atorvastatin (LIPITOR) 20 MG tablet Take 1 tablet (20 mg total) by mouth daily. 90 tablet 3  . furosemide (LASIX) 40 MG tablet TAKE 1 TABLET(40 MG) BY MOUTH DAILY 30 tablet 6  . metoprolol succinate (TOPROL XL) 25 MG 24 hr tablet Take 1 tablet (25 mg total) by mouth daily. 30 tablet 11  . spironolactone (ALDACTONE) 25 MG tablet TAKE 1/2 TABLET(12.5 MG) BY MOUTH  DAILY 45 tablet 3   No current facility-administered medications for this encounter.    Allergies  Allergen Reactions  . Other Other (See Comments)    No blood products      Social History   Socioeconomic History  . Marital status: Single    Spouse name: Not on file  . Number of children: Not on file  . Years of education: Not on file  . Highest education level: Not on file  Occupational History  . Not on file  Tobacco Use  . Smoking status: Never Smoker  . Smokeless tobacco: Never Used  Substance and Sexual Activity  . Alcohol use: Yes  . Drug use: Not on file  . Sexual activity: Not on file  Other Topics Concern  . Not on file  Social History Narrative  . Not on file   Social Determinants of Health   Financial Resource Strain: Not on file  Food Insecurity: Not on file  Transportation Needs: Not on file  Physical Activity: Not on file  Stress: Not on file  Social Connections: Not on file  Intimate Partner Violence: Not on file     Family History  Problem Relation Age of Onset  . Gout Sister     Vitals:   02/12/21 1019  BP: 110/78  Pulse: 80  SpO2:  97%  Weight: 104.7 kg (230 lb 12.8 oz)    Wt Readings from Last 3 Encounters:  01/02/21 104.9 kg (231 lb 3.2 oz)  10/02/20 100.5 kg (221 lb 9.6 oz)  09/24/20 100.2 kg (221 lb)    PHYSICAL EXAM: General:  Well appearing. No resp difficulty HEENT: normal Neck: supple. no JVD. Carotids 2+ bilat; no bruits. No lymphadenopathy or thryomegaly appreciated. Cor: PMI nondisplaced. Regular rate & rhythm. No rubs, gallops or murmurs. Lungs: clear Abdomen: soft, nontender, nondistended. No hepatosplenomegaly. No bruits or masses. Good bowel sounds. Extremities: no cyanosis, clubbing, rash, edema Neuro: alert & orientedx3, cranial nerves grossly intact. moves all 4 extremities w/o difficulty. Affect pleasant   ICD interrogation: No VT or AF. Volume ok. Activity level 4hr/day Personally reviewed    ASSESSMENT  & PLAN: 1. Chronic Systolic Heart Failure - ECHO 0/86 EF 15% with severe global HK, and moderate RV dysfunction. Etiology possible ETOH versus HTN (denies severe ETOH use). TSH ok. HIV NR  - 4/28 LHC with severe 1v CAD. RHC with preserved cardiac output.  - S/p CABG x 1 w/ LIMA-LAD 01/30/20 - Echo 6/21, LVEF 25-30%, RV mildly reduced.  - Echo 9/21 EF 25-30%  - s/p ICD 12/21. ICD interrogation as above.  - Stable NYHA II. Volume ok  - Continue Entresto 97-103 mg bid.  - Continue Farxiga 10 mg daily.  - Continue spiro 12.5 mg daily.  - Continue lasix 40 mg daily. - Increase Toprol XL 25 -> 50 mg daily  - RTC in 6 months with echo   2. CAD:  - LHC 12/2019 with severe 1V CAD. - S/p CABG x 1 w/ LIMA-LAD 01/30/20. - No s/s angina - on ASA 81 mg and atorva 20. - continue Toprol XL 25 mg daily.  - Check lipids today  3. H/o Atrial Flutter  - s/p ablation 6/21 by [name]. Periods of junctional rhythm post ablation- off amio and dig -> improved. Tolerating low dose  blocker ok. Now s/p ICD - Maintaining SR on EKG today. HR fast 118 bpm.  - Eliquis discontinued by [name] 7/21. Patient currently taking Eliquis. - Zio 1/21 No AFL - > can stop Eliquis     Arvilla Meres, MD 02/12/21

## 2021-03-06 ENCOUNTER — Emergency Department (HOSPITAL_COMMUNITY): Payer: Medicaid Other

## 2021-03-06 ENCOUNTER — Emergency Department (HOSPITAL_COMMUNITY)
Admission: EM | Admit: 2021-03-06 | Discharge: 2021-03-06 | Disposition: A | Discharge status: Home / Self Care | Payer: Medicaid Other | Attending: Emergency Medicine | Admitting: Emergency Medicine

## 2021-03-06 ENCOUNTER — Telehealth (HOSPITAL_COMMUNITY): Payer: Self-pay | Admitting: Pharmacy Technician

## 2021-03-06 ENCOUNTER — Other Ambulatory Visit (HOSPITAL_COMMUNITY): Payer: Self-pay

## 2021-03-06 ENCOUNTER — Other Ambulatory Visit: Payer: Self-pay

## 2021-03-06 ENCOUNTER — Encounter (HOSPITAL_COMMUNITY): Payer: Self-pay | Admitting: Student

## 2021-03-06 DIAGNOSIS — I110 Hypertensive heart disease with heart failure: Secondary | ICD-10-CM | POA: Insufficient documentation

## 2021-03-06 DIAGNOSIS — I2510 Atherosclerotic heart disease of native coronary artery without angina pectoris: Secondary | ICD-10-CM | POA: Insufficient documentation

## 2021-03-06 DIAGNOSIS — R000 Tachycardia, unspecified: Secondary | ICD-10-CM | POA: Diagnosis not present

## 2021-03-06 DIAGNOSIS — Z79899 Other long term (current) drug therapy: Secondary | ICD-10-CM | POA: Insufficient documentation

## 2021-03-06 DIAGNOSIS — I5023 Acute on chronic systolic (congestive) heart failure: Secondary | ICD-10-CM | POA: Diagnosis not present

## 2021-03-06 DIAGNOSIS — I5022 Chronic systolic (congestive) heart failure: Secondary | ICD-10-CM

## 2021-03-06 DIAGNOSIS — Z7982 Long term (current) use of aspirin: Secondary | ICD-10-CM | POA: Diagnosis not present

## 2021-03-06 DIAGNOSIS — Z951 Presence of aortocoronary bypass graft: Secondary | ICD-10-CM | POA: Insufficient documentation

## 2021-03-06 DIAGNOSIS — I480 Paroxysmal atrial fibrillation: Secondary | ICD-10-CM | POA: Diagnosis not present

## 2021-03-06 LAB — BASIC METABOLIC PANEL
Anion gap: 10 (ref 5–15)
BUN: 17 mg/dL (ref 8–23)
CO2: 23 mmol/L (ref 22–32)
Calcium: 8.9 mg/dL (ref 8.9–10.3)
Chloride: 106 mmol/L (ref 98–111)
Creatinine, Ser: 1.19 mg/dL (ref 0.61–1.24)
GFR, Estimated: >60 mL/min (ref >60)
Glucose, Bld: 131 mg/dL — ABNORMAL HIGH (ref 70–99)
Potassium: 4.2 mmol/L (ref 3.5–5.1)
Sodium: 139 mmol/L (ref 135–145)

## 2021-03-06 LAB — TSH: TSH: 1.145 u[IU]/mL (ref 0.350–4.500)

## 2021-03-06 LAB — CBC WITH DIFFERENTIAL/PLATELET
Abs Immature Granulocytes: 0.02 10*3/uL (ref 0.00–0.07)
Basophils Absolute: 0.1 10*3/uL (ref 0.0–0.1)
Basophils Relative: 1 %
Eosinophils Absolute: 0.1 10*3/uL (ref 0.0–0.5)
Eosinophils Relative: 2 %
HCT: 48.5 % (ref 39.0–52.0)
Hemoglobin: 16.2 g/dL (ref 13.0–17.0)
Immature Granulocytes: 0 %
Lymphocytes Relative: 24 %
Lymphs Abs: 1.7 10*3/uL (ref 0.7–4.0)
MCH: 31.4 pg (ref 26.0–34.0)
MCHC: 33.4 g/dL (ref 30.0–36.0)
MCV: 94 fL (ref 80.0–100.0)
Monocytes Absolute: 0.6 10*3/uL (ref 0.1–1.0)
Monocytes Relative: 9 %
Neutro Abs: 4.5 10*3/uL (ref 1.7–7.7)
Neutrophils Relative %: 64 %
Platelets: 199 10*3/uL (ref 150–400)
RBC: 5.16 MIL/uL (ref 4.22–5.81)
RDW: 12.8 % (ref 11.5–15.5)
WBC: 7 10*3/uL (ref 4.0–10.5)
nRBC: 0 % (ref 0.0–0.2)

## 2021-03-06 LAB — BRAIN NATRIURETIC PEPTIDE: B Natriuretic Peptide: 177.6 pg/mL — ABNORMAL HIGH (ref 0.0–100.0)

## 2021-03-06 LAB — TROPONIN I (HIGH SENSITIVITY)
Troponin I (High Sensitivity): 18 ng/L — ABNORMAL HIGH (ref <18)
Troponin I (High Sensitivity): 18 ng/L — ABNORMAL HIGH (ref <18)

## 2021-03-06 LAB — D-DIMER, QUANTITATIVE: D-Dimer, Quant: 0.98 ug/mL-FEU — ABNORMAL HIGH (ref 0.00–0.50)

## 2021-03-06 LAB — MAGNESIUM: Magnesium: 2.1 mg/dL (ref 1.7–2.4)

## 2021-03-06 NOTE — Discharge Instructions (Addendum)
Corey Brady, it was a pleasure taking care of you. From your testing, it seems that you are dehydrated. Please hold your Lasix as cardiology advised, and follow up with them next week in clinic.

## 2021-03-06 NOTE — Telephone Encounter (Signed)
Advanced Heart Failure Patient Advocate Encounter  I received a Statistician for this patient's Entresto.   Upon investigation, patient currently has a managed Medicaid plan. Current 30 day co-pay is $0. Will not seek renewal at this time. Looks like the RX has already been sent to the patient's pharmacy.  Archer Asa, CPhT

## 2021-03-06 NOTE — ED Provider Notes (Signed)
Emergency Medicine Provider Triage Evaluation Note  Corey Brady , a 65 y.o. male  was evaluated in triage.  Pt complains of SOB, tachycardia. Forgot to take home meds last night. No PND, Orthopnea. No LE edema. Hx of A flutter No anticoagulation per prior cards note, Hx of ICD. On beta blocker. No back pain, CP, Cough  Review of Systems  Positive: Palpitations, SOB Negative: CP, LE edema  Physical Exam  Temp 98.7 F (37.1 C) (Oral)  Gen:   Awake, no distress   Resp:  Normal effort  MSK:   Moves extremities without difficulty  Other:    Medical Decision Making  Medically screening exam initiated at 2:21 PM.  Appropriate orders placed.  Ranen Doolin was informed that the remainder of the evaluation will be completed by another provider, this initial triage assessment does not replace that evaluation, and the importance of remaining in the ED until their evaluation is complete.  Tachycardia     Maia Handa A, PA-C 03/06/21 1426    Wynetta Fines, MD 03/09/21 1119

## 2021-03-06 NOTE — ED Provider Notes (Signed)
[facility] EMERGENCY DEPARTMENT Provider Note   CSN: [id number] Arrival date & time: 03/06/21  1407     History Chief Complaint  Patient presents with   Tachycardia   Corey Brady is a 65 y.o. male with hx of A flutter, CAD s/p CABG, CHF, alcohol use disorder, HTN who presents with tachycardia seen on blood pressure machine at [facility]. He is totally asymptomatic. He checks his BP and heart rate in this way once in a while, no symptoms prompted him to do so today. Reports heart rate normally in 80s-90s. Prior to this was in his usual state of health, has not felt ill or had any unusual events in the past few days. No recent long immobilization, surgery, or trauma. He did miss his dose of evening medications last night. He takes 2 at night and one is [name], unsure what the other is but could be his Toprol. He then took his morning medications today which should include Toprol. (This is prescribed as 2 tablets daily, he thinks he might be splitting this up and taking 1 tablet BID). He had 3-4 beers last night, has cut back alcohol use in the past year. Now drinks 1-2 drinks every 2-3 days.  He follows with [name] of EP for A flutter and [name] for his CAD. A flutter developed in May 2021 after CABG. Had ablation on 03/15/20 without known recurrence until now. Had cardiac monitoring for 7 days in January which demonstrated some runs of SVT, rare PACs. ICD in place. Currently on Toprol. He may have missed a dose of Toprol last night, but took his medications normally this morning. States he was taken off Eliquis by his cardiologist a few weeks ago.   Past Medical History:  Diagnosis Date   CHF (congestive heart failure) (HCC)    Gout    No blood products 01/27/2020    Patient Active Problem List   Diagnosis Date Noted   Venous stasis of both lower extremities 05/24/2020   History of atrial flutter 05/24/2020   Acute on chronic systolic (congestive) heart  failure (HCC) 03/12/2020   S/P CABG (coronary artery bypass graft) 01/30/2020    Past Surgical History:  Procedure Laterality Date   A-FLUTTER ABLATION N/A 03/15/2020   Procedure: A-FLUTTER ABLATION;  Surgeon: Regan Lemming, MD;  Location: [facility] INVASIVE CV LAB;  Service: Cardiovascular;  Laterality: N/A;   CARDIOVERSION N/A 02/06/2020   Procedure: CARDIOVERSION;  Surgeon: Dolores Patty, MD;  Location: [facility] ENDOSCOPY;  Service: Cardiovascular;  Laterality: N/A;   CORONARY ARTERY BYPASS GRAFT N/A 01/30/2020   Procedure: OFF PUMP CORONARY ARTERY BYPASS GRAFTING (CABG) TIMES ONE;  Surgeon: Corliss Skains, MD;  Location: [facility] OR;  Service: Open Heart Surgery;  Laterality: N/A;  swan only   ICD IMPLANT N/A 09/24/2020   Procedure: ICD IMPLANT;  Surgeon: Regan Lemming, MD;  Location: [facility] INVASIVE CV LAB;  Service: Cardiovascular;  Laterality: N/A;   RIGHT/LEFT HEART CATH AND CORONARY ANGIOGRAPHY N/A 01/26/2020   Procedure: RIGHT/LEFT HEART CATH AND CORONARY ANGIOGRAPHY;  Surgeon: Dolores Patty, MD;  Location: [facility] INVASIVE CV LAB;  Service: Cardiovascular;  Laterality: N/A;   TEE WITHOUT CARDIOVERSION N/A 01/30/2020   Procedure: TRANSESOPHAGEAL ECHOCARDIOGRAM (TEE);  Surgeon: Corliss Skains, MD;  Location: [facility] OR;  Service: Open Heart Surgery;  Laterality: N/A;   TEE WITHOUT CARDIOVERSION N/A 02/06/2020   Procedure: TRANSESOPHAGEAL ECHOCARDIOGRAM (TEE);  Surgeon: Dolores Patty, MD;  Location: [facility] ENDOSCOPY;  Service: Cardiovascular;  Laterality: N/A;       Family History  Problem Relation Age of Onset   Gout Sister     Social History   Tobacco Use   Smoking status: Never   Smokeless tobacco: Never  Substance Use Topics   Alcohol use: Yes    Comment: has 1-2 drinks every 2-3 days    Home Medications Prior to Admission medications   Medication Sig Start Date End Date Taking? Authorizing Provider  aspirin 81 MG chewable tablet Chew 1 tablet (81 mg total) by  mouth daily. 02/09/20   Gold, Wayne E, PA-C  atorvastatin (LIPITOR) 20 MG tablet Take 1 tablet (20 mg total) by mouth daily. 11/28/20 02/26/21  Bensimhon, Bevelyn Buckles, MD  ENTRESTO 97-103 MG TAKE 1 TABLET BY MOUTH TWICE DAILY 01/06/21   Camnitz, Andree Coss, MD  FARXIGA 10 MG TABS tablet TAKE 1 TABLET(10 MG) BY MOUTH DAILY BEFORE BREAKFAST 01/20/21   Robbie Lis M, PA-C  furosemide (LASIX) 40 MG tablet TAKE 1 TABLET(40 MG) BY MOUTH DAILY 11/29/20   Bensimhon, Bevelyn Buckles, MD  metoprolol succinate (TOPROL XL) 25 MG 24 hr tablet Take 2 tablets (50 mg total) by mouth daily. 02/12/21   Bensimhon, Bevelyn Buckles, MD  spironolactone (ALDACTONE) 25 MG tablet TAKE 1/2 TABLET(12.5 MG) BY MOUTH DAILY 11/28/20   Bensimhon, Bevelyn Buckles, MD    Allergies    Other  Review of Systems   Review of Systems  Constitutional:  Negative for chills, fatigue and fever.  HENT:  Negative for congestion and sore throat.   Eyes:  Negative for visual disturbance.  Respiratory:  Negative for cough and shortness of breath.   Cardiovascular:  Negative for chest pain, palpitations and leg swelling.  Gastrointestinal:  Negative for abdominal pain, diarrhea, nausea and vomiting.  Neurological:  Negative for dizziness, syncope and light-headedness.  All other systems reviewed and are negative.  Physical Exam Updated Vital Signs BP 104/79   Pulse (!) 108   Temp 98.7 F (37.1 C) (Oral)   Resp 17   Ht 6' (1.829 m)   Wt 102.1 kg   SpO2 96%   BMI 30.52 kg/m   Physical Exam Vitals and nursing note reviewed.  Constitutional:      Appearance: Normal appearance.  HENT:     Head: Normocephalic and atraumatic.     Right Ear: External ear normal.     Left Ear: External ear normal.     Nose: Nose normal.     Mouth/Throat:     Mouth: Mucous membranes are moist.  Eyes:     Extraocular Movements: Extraocular movements intact.  Cardiovascular:     Rate and Rhythm: Regular rhythm. Tachycardia present.     Heart sounds: Normal heart sounds.  No murmur heard.   No friction rub. No gallop.  Pulmonary:     Effort: Pulmonary effort is normal. No respiratory distress.     Breath sounds: No wheezing, rhonchi or rales.  Abdominal:     General: Abdomen is flat.     Tenderness: There is no abdominal tenderness.  Musculoskeletal:        General: No swelling, tenderness or deformity. Normal range of motion.     Cervical back: Normal range of motion and neck supple.     Right lower leg: No edema.     Left lower leg: No edema.     Comments: Negative Homan's sign  Skin:    General: Skin is warm and dry.  Neurological:  General: No focal deficit present.     Mental Status: He is alert and oriented to person, place, and time.  Psychiatric:        Mood and Affect: Mood normal.        Behavior: Behavior normal.    ED Results / Procedures / Treatments   Labs (all labs ordered are listed, but only abnormal results are displayed) Labs Reviewed  BASIC METABOLIC PANEL - Abnormal; Notable for the following components:      Result Value   Glucose, Bld 131 (*)    All other components within normal limits  BRAIN NATRIURETIC PEPTIDE - Abnormal; Notable for the following components:   B Natriuretic Peptide 177.6 (*)    All other components within normal limits  TROPONIN I (HIGH SENSITIVITY) - Abnormal; Notable for the following components:   Troponin I (High Sensitivity) 18 (*)    All other components within normal limits  TROPONIN I (HIGH SENSITIVITY) - Abnormal; Notable for the following components:   Troponin I (High Sensitivity) 18 (*)    All other components within normal limits  CBC WITH DIFFERENTIAL/PLATELET  MAGNESIUM  TSH    EKG EKG Interpretation  Date/Time:  Thursday March 06 2021 17:23:57 EDT Ventricular Rate:  110 PR Interval:  172 QRS Duration: 119 QT Interval:  343 QTC Calculation: 464 R Axis:   -29 Text Interpretation: Sinus tachycardia LVH with IVCD and secondary repol abnrm Confirmed by Marianna Fuss  [phone]) on 03/06/2021 5:53:32 PM  Radiology DG Chest 2 View  Result Date: 03/06/2021 CLINICAL DATA:  Shortness of breath. EXAM: CHEST - 2 VIEW COMPARISON:  September 24, 2020. FINDINGS: The heart size and mediastinal contours are within normal limits. Sternotomy wires are noted. Left-sided pacemaker is unchanged in position. Both lungs are clear. The visualized skeletal structures are unremarkable. IMPRESSION: No active cardiopulmonary disease. Electronically Signed   By: Lupita Raider M.D.   On: 03/06/2021 15:12    Procedures Procedures   Medications Ordered in ED Medications - No data to display  ED Course  I have reviewed the triage vital signs and the nursing notes.  Pertinent labs & imaging results that were available during my care of the patient were reviewed by me and considered in my medical decision making (see chart for details).    MDM Rules/Calculators/A&P                          65 y/o gentleman with hx of A flutter, CAD, CABG, ICD placement who presents with asymptomatic tachycardia noticed upon checking his BP and pulse at [facility]. EKG and telemetry demonstrate sinus tachycardia. Has been in his usual state of health with the exception of possible missing a dose of Toprol last night, thinks that he took a dose this morning. BP is WNL, patient is well-appearing. Ddx includes ACS, electrolyte abnormality, anemia, VTE. Will follow up troponin, BMP, Mg, CBC. Repeat EKG. Interrogate ICD (Medtronic Visia).   Initial workup significant for high sensitivity troponin of 18. Electrolytes, CBC, CXR are within normal limits. Pending repeat EKG, repeat troponin. Cardiology consulted.   Second troponin 18 as well. Repeat EKG with sinus tachycardia again.   Appreciate cardiology consultation. Believe symptoms are due to dehydration. Instructed patient to stop Lasix and hydrate. F/u TSH outpatient. Appointment made with cardiology in 1 week.  Final Clinical Impression(s) / ED  Diagnoses Final diagnoses:  Sinus tachycardia       Remo Lipps, MD 03/06/21  3709    Milagros Loll, MD 03/09/21 2315

## 2021-03-06 NOTE — ED Notes (Signed)
EKG completed. It was given to provider.

## 2021-03-06 NOTE — ED Notes (Signed)
Patient transported to X-ray 

## 2021-03-06 NOTE — ED Triage Notes (Signed)
Patient here with complaint of feeling like his heart is racing a few hours ago. Patient alert, oriented, ambulatory and in no apparent distress at this time. HR 116 in triage.

## 2021-03-06 NOTE — Consult Note (Addendum)
Advanced Heart Failure Team Consult Note   Primary Physician: Marcine Matar, MD PCP-Cardiologist:  None  Reason for Consultation:Dr Scott  HPI:    Corey Brady is seen today for evaluation of heart failure at the request of Dr Lorin Picket.   Corey Brady is a 65 year old with history of  CAD, severe systolic HF and PAFL. Underwent CABG 5/21. Post-op course c/b low output and PAFL requiring several DC-CV. Was placed on amiodarone.  Had Medtronic ICd placed    Readmitted for 02/2020 for a/c CHF w/ volume overload, recurrent AFL and bilateral LE cellulitis. He was loaded on IV amiodarone, started on IV lasix for CHF and started on IV vanc for cellulitis. EP was consulted for AFL and he underwent  AFL ablation 03/16/20. Post ablation had periods of junctional rhythm. Dig and amio held and junctional rhythm resolved.  Eliquis was stopped in July 2021.   Echo- 05/2020 EF 25-30% RV moderately reduced.   Saw Dr Leory Plowman May 2022 and was instructed to increase Toprol XL to 25 mg twice a day.    Recently working out in his yard and sweating a lot. Missed last nights dose of metoprolol. He fills his own pill box and says that was the first time he missed his evening dose of metoprolol. Yesterday had 4 beers in the evening. Says he normally drinks 2 beers a day. Denies SOB/ chest pain. Weight has been stable at home.    Presented to [facility] with fast heart rate. EKG on arrival showed ST.CXR- no acute findings. Labs BNP 177, Creatinine 1.2, Hgb 16.2, and WBC 7, Mag 2.1, HS Trop 18. Currently with no complaints.   Medtronic ICD: Single Chamber. No A Fib, no VT. Fluid index low. Imdepance ok. Sinus Tach rate 110-120s for the last 30 days. Activity > 3.5 hours.   Review of Systems: [y] = yes, [ ]  = no   General: Weight gain [ ] ; Weight loss [ ] ; Anorexia [ ] ; Fatigue [ ] ; Fever [ ] ; Chills [ ] ; Weakness [ ]   Cardiac: Chest pain/pressure [ ] ; Resting SOB [ ] ; Exertional SOB [ ] ; Orthopnea [ ] ; Pedal Edema [ ] ;  Palpitations [ Y]; Syncope [ ] ; Presyncope [ ] ; Paroxysmal nocturnal dyspnea[ ]   Pulmonary: Cough [ ] ; Wheezing[ ] ; Hemoptysis[ ] ; Sputum [ ] ; Snoring [ ]   GI: Vomiting[ ] ; Dysphagia[ ] ; Melena[ ] ; Hematochezia [ ] ; Heartburn[ ] ; Abdominal pain [ ] ; Constipation [ ] ; Diarrhea [ ] ; BRBPR [ ]   GU: Hematuria[ ] ; Dysuria [ ] ; Nocturia[ ]   Vascular: Pain in legs with walking [ ] ; Pain in feet with lying flat [ ] ; Non-healing sores [ ] ; Stroke [ ] ; TIA [ ] ; Slurred speech [ ] ;  Neuro: Headaches[ ] ; Vertigo[ ] ; Seizures[ ] ; Paresthesias[ ] ;Blurred vision [ ] ; Diplopia [ ] ; Vision changes [ ]   Ortho/Skin: Arthritis [ y]; Joint pain [ y]; Muscle pain [ ] ; Joint swelling [ ] ; Back Pain [ Y]; Rash [ ]   Psych: Depression[ ] ; Anxiety[ ]   Heme: Bleeding problems [ ] ; Clotting disorders [ ] ; Anemia [ ]   Endocrine: Diabetes [ ] ; Thyroid dysfunction[ ]   Home Medications Prior to Admission medications   Medication Sig Start Date End Date Taking? Authorizing Provider  aspirin 81 MG chewable tablet Chew 1 tablet (81 mg total) by mouth daily. 02/09/20   Gold, Wayne E, PA-C  atorvastatin (LIPITOR) 20 MG tablet Take 1 tablet (20 mg total) by mouth daily. 11/28/20 02/26/21  Willis Holquin,  Bevelyn Buckles, MD  ENTRESTO 97-103 MG TAKE 1 TABLET BY MOUTH TWICE DAILY 01/06/21   Camnitz, Andree Coss, MD  FARXIGA 10 MG TABS tablet TAKE 1 TABLET(10 MG) BY MOUTH DAILY BEFORE BREAKFAST 01/20/21   Robbie Lis M, PA-C  furosemide (LASIX) 40 MG tablet TAKE 1 TABLET(40 MG) BY MOUTH DAILY 11/29/20   Silveria Botz, Bevelyn Buckles, MD  metoprolol succinate (TOPROL XL) 25 MG 24 hr tablet Take 2 tablets (50 mg total) by mouth daily. 02/12/21   Candie Gintz, Bevelyn Buckles, MD  spironolactone (ALDACTONE) 25 MG tablet TAKE 1/2 TABLET(12.5 MG) BY MOUTH DAILY 11/28/20   Theone Bowell, Bevelyn Buckles, MD    Past Medical History: Past Medical History:  Diagnosis Date   CHF (congestive heart failure) (HCC)    Gout    No blood products 01/27/2020    Past Surgical History: Past  Surgical History:  Procedure Laterality Date   A-FLUTTER ABLATION N/A 03/15/2020   Procedure: A-FLUTTER ABLATION;  Surgeon: Regan Lemming, MD;  Location: [facility] INVASIVE CV LAB;  Service: Cardiovascular;  Laterality: N/A;   CARDIOVERSION N/A 02/06/2020   Procedure: CARDIOVERSION;  Surgeon: Dolores Patty, MD;  Location: [facility] ENDOSCOPY;  Service: Cardiovascular;  Laterality: N/A;   CORONARY ARTERY BYPASS GRAFT N/A 01/30/2020   Procedure: OFF PUMP CORONARY ARTERY BYPASS GRAFTING (CABG) TIMES ONE;  Surgeon: Corliss Skains, MD;  Location: [facility] OR;  Service: Open Heart Surgery;  Laterality: N/A;  swan only   ICD IMPLANT N/A 09/24/2020   Procedure: ICD IMPLANT;  Surgeon: Regan Lemming, MD;  Location: [facility] INVASIVE CV LAB;  Service: Cardiovascular;  Laterality: N/A;   RIGHT/LEFT HEART CATH AND CORONARY ANGIOGRAPHY N/A 01/26/2020   Procedure: RIGHT/LEFT HEART CATH AND CORONARY ANGIOGRAPHY;  Surgeon: Dolores Patty, MD;  Location: [facility] INVASIVE CV LAB;  Service: Cardiovascular;  Laterality: N/A;   TEE WITHOUT CARDIOVERSION N/A 01/30/2020   Procedure: TRANSESOPHAGEAL ECHOCARDIOGRAM (TEE);  Surgeon: Corliss Skains, MD;  Location: [facility] OR;  Service: Open Heart Surgery;  Laterality: N/A;   TEE WITHOUT CARDIOVERSION N/A 02/06/2020   Procedure: TRANSESOPHAGEAL ECHOCARDIOGRAM (TEE);  Surgeon: Dolores Patty, MD;  Location: [facility] ENDOSCOPY;  Service: Cardiovascular;  Laterality: N/A;    Family History: Family History  Problem Relation Age of Onset   Gout Sister     Social History: Social History   Socioeconomic History   Marital status: Single    Spouse name: Not on file   Number of children: Not on file   Years of education: Not on file   Highest education level: Not on file  Occupational History   Not on file  Tobacco Use   Smoking status: Never   Smokeless tobacco: Never  Substance and Sexual Activity   Alcohol use: Yes    Comment: has 1-2 drinks every 2-3 days   Drug use:  Not on file   Sexual activity: Not on file  Other Topics Concern   Not on file  Social History Narrative   Not on file   Social Determinants of Health   Financial Resource Strain: Not on file  Food Insecurity: Not on file  Transportation Needs: Not on file  Physical Activity: Not on file  Stress: Not on file  Social Connections: Not on file    Allergies:  Allergies  Allergen Reactions   Other Other (See Comments)    No blood products    Objective:    Vital Signs:   Temp:  [98.7 F (37.1 C)] 98.7 F (37.1 C) (06/09  1421) Pulse Rate:  [106-129] 107 (06/09 1630) Resp:  [16-20] 16 (06/09 1630) BP: (105-114)/(83-91) 108/86 (06/09 1630) SpO2:  [96 %-98 %] 97 % (06/09 1630) Weight:  [102.1 kg] 102.1 kg (06/09 1620)    Weight change: Filed Weights   03/06/21 1620  Weight: 102.1 kg    Intake/Output:  No intake or output data in the 24 hours ending 03/06/21 1653    Physical Exam    General:  . No resp difficulty HEENT: normal Neck: supple. JVP flat . Carotids 2+ bilat; no bruits. No lymphadenopathy or thyromegaly appreciated. Cor: PMI nondisplaced. Tachy Regular rate & rhythm. No rubs, gallops or murmurs. Lungs: clear Abdomen: soft, nontender, nondistended. No hepatosplenomegaly. No bruits or masses. Good bowel sounds. Extremities: no cyanosis, clubbing, rash, edema Neuro: alert & orientedx3, cranial nerves grossly intact. moves all 4 extremities w/o difficulty. Affect pleasant   Telemetry  Sinus Tach 100-110  EKG   Sinsu Tach 118 bpmQTc 583 at 1430  Sinus Tach 110 bpm QTc 464 at 1723  Labs   Basic Metabolic Panel: Recent Labs  Lab 03/06/21 1422  NA 139  K 4.2  CL 106  CO2 23  GLUCOSE 131*  BUN 17  CREATININE 1.19  CALCIUM 8.9  MG 2.1    Liver Function Tests: No results for input(s): AST, ALT, ALKPHOS, BILITOT, PROT, ALBUMIN in the last 168 hours. No results for input(s): LIPASE, AMYLASE in the last 168 hours. No results for input(s):  AMMONIA in the last 168 hours.  CBC: Recent Labs  Lab 03/06/21 1422  WBC 7.0  NEUTROABS 4.5  HGB 16.2  HCT 48.5  MCV 94.0  PLT 199    Cardiac Enzymes: No results for input(s): CKTOTAL, CKMB, CKMBINDEX, TROPONINI in the last 168 hours.  BNP: BNP (last 3 results) Recent Labs    03/12/20 1127 02/12/21 1046 03/06/21 1422  BNP 533.8* 81.5 177.6*    ProBNP (last 3 results) No results for input(s): PROBNP in the last 8760 hours.   CBG: No results for input(s): GLUCAP in the last 168 hours.  Coagulation Studies: No results for input(s): LABPROT, INR in the last 72 hours.   Imaging   DG Chest 2 View  Result Date: 03/06/2021 CLINICAL DATA:  Shortness of breath. EXAM: CHEST - 2 VIEW COMPARISON:  September 24, 2020. FINDINGS: The heart size and mediastinal contours are within normal limits. Sternotomy wires are noted. Left-sided pacemaker is unchanged in position. Both lungs are clear. The visualized skeletal structures are unremarkable. IMPRESSION: No active cardiopulmonary disease. Electronically Signed   By: Lupita Raider M.D.   On: 03/06/2021 15:12     Medications:     Current Medications:   Infusions:     Assessment/Plan   Sinus Tach  Medtronic Interrogation - Sinus Tach Rate 110-120s over the last 30 days. Appears dry on exam. HS Trop 18.  Suspect volume depletion in the setting of diuretics, ETOH use, and volume loss due to sweating.  -Stop lasix.  - Continue Toprol XL 25 mg twice a day.  2. Chronic Systolic HF  05/2020 EF 30-35.% due to iCM. S/p CABG 5/21  POCUS today --> IVC small.  -Medtronic ICD - Fluid index low. Not suggestive of fluid overload.  -BNP low. Suspect volume depletion as noted above. Stop lasix.  -Tonight hold entresto.  - Tomorrow can resume HF meds except will stop lasix.    3. PAF -H/O A flutter and had an ablation in 2021. -Has been in SR over  the last several months. No Afib on device interrogation.  -No longer on eliquis.    ETOH  Needs to avoid alcohol intake   5. CAD LHC 12/2019 with severe 1V CAD. - S/p CABG x 1 w/ LIMA-LAD 01/30/20. -No chest pain. HS 18.  -EKG ok.    HF follow up set up next week 03/11/21 at 1100.   Length of Stay: 0  Tonye Becket, NP  03/06/2021, 4:53 PM  Advanced Heart Failure Team Pager [phone] (M-F; 7a - 5p)  Please contact [facility] for night-coverage after hours (4p -7a ) and weekends on [email or web address]  Patient seen and examined with the above-signed Advanced Practice Provider and/or Housestaff. I personally reviewed laboratory data, imaging studies and relevant notes. I independently examined the patient and formulated the important aspects of the plan. I have edited the note to reflect any of my changes or salient points. I have personally discussed the plan with the patient and/or family.  65 y/o male with h/o CAD s/p CABG 5/21 and chronic systolic HF EF 30-35% referred to the ED for asymptomatic tachycardia.   Says he has been feeling quite well. Working in his yard without symptoms. Has noted over past few weeks resting HR has gone up slowly.   Came to ER today and found to be in sinus tach HR 110-118. Reports missing his toprol yesterday and drinking 4 beers.   Labs ok. Denies f/c/diarrhea/n/v/bleeding  ICD interrogated. No arrhythmias. Volume low. Resting HR has trended up from 90-100 -> 110-115.   General:  Well appearing. No resp difficulty HEENT: normal Neck: supple. no JVD. Carotids 2+ bilat; no bruits. No lymphadenopathy or thryomegaly appreciated. Cor: PMI nondisplaced. Regular tachy. No rubs, gallops or murmurs. Lungs: clear Abdomen: soft, nontender, nondistended. No hepatosplenomegaly. No bruits or masses. Good bowel sounds. Extremities: no cyanosis, clubbing, rash, edema Neuro: alert & orientedx3, cranial nerves grossly intact. moves all 4 extremities w/o difficulty. Affect pleasant  Suspect sinus tach due to volume depletion. ICD interrogation  reassuring. Labs ok.   Can go home. Will stop lasix. Increase po intake. Will see back in HF Clinic next week. D/w EDP.   Arvilla Meres, MD  10:07 PM

## 2021-03-10 ENCOUNTER — Other Ambulatory Visit (HOSPITAL_COMMUNITY): Payer: Self-pay

## 2021-03-10 ENCOUNTER — Telehealth (HOSPITAL_COMMUNITY): Payer: Self-pay | Admitting: Pharmacy Technician

## 2021-03-10 ENCOUNTER — Other Ambulatory Visit (HOSPITAL_COMMUNITY): Payer: Self-pay | Admitting: *Deleted

## 2021-03-10 MED ORDER — SPIRONOLACTONE 25 MG PO TABS: ORAL_TABLET | ORAL | 3 refills | Status: DC

## 2021-03-10 MED ORDER — FUROSEMIDE 40 MG PO TABS: ORAL_TABLET | ORAL | 3 refills | Status: DC

## 2021-03-10 MED ORDER — DAPAGLIFLOZIN PROPANEDIOL 10 MG PO TABS: 10.0000 mg | ORAL_TABLET | Freq: Every day | ORAL | 3 refills | Status: DC

## 2021-03-10 MED ORDER — METOPROLOL SUCCINATE ER 25 MG PO TB24: 50.0000 mg | ORAL_TABLET | Freq: Every day | ORAL | 3 refills | Status: DC

## 2021-03-10 MED ORDER — ENTRESTO 97-103 MG PO TABS: 1.0000 | ORAL_TABLET | Freq: Two times a day (BID) | ORAL | 3 refills | Status: DC

## 2021-03-10 MED ORDER — ATORVASTATIN CALCIUM 20 MG PO TABS
20.0000 mg | ORAL_TABLET | Freq: Every day | ORAL | 3 refills | Status: DC
Start: 2021-03-10 — End: 2022-03-13

## 2021-03-10 NOTE — Telephone Encounter (Signed)
Advanced Heart Failure Patient Advocate Encounter  Received a renewal notification from [employer] for [place] assistance. The patient currently has a managed Medicaid plan, 90 day co-pay is $0. Marcelline Deist has the same 90 day co-pay.   Will not seek renewal at this time. Asked Jasmine, (CMA) to send 90 day supplies of all the patient's medications, to [facility] on [address].  Archer Asa, CPhT

## 2021-03-10 NOTE — Progress Notes (Signed)
Advanced Heart Failure Clinic Note   PCP: [name] EP: [name] HF Cardiologist:  [name]   HPI:  Corey Brady is a 65 y/o male w/ CAD, severe systolic HF and PAFL. Underwent CABG 5/21. Post-op course c/b low output and PAFL requiring several DC-CV. Was placed on amiodarone.     Readmitted for HF 6/21 for a/c CHF w/ volume overload, recurrent AFL and bilateral LE cellulitis.     He was loaded on IV amiodarone, started on IV lasix for CHF and started on IV vanc for cellulitis. EP was consulted for AFL and he underwent  AFL ablation 03/16/20. Post ablation had periods of junctional rhythm. Dig and amio held and junctional rhythm resolved.  He had EP f/u w/ [name] 04/25/20 and was in NSR. Eliquis was discontinued and low dose metoprolol was added. He has done well since and has been followed closely and meds optimized.   He underwent ICD implant on 09/24/20  Saw Dr Leory Plowman May 2022 and was instructed to increase Toprol XL to 25 mg bid.    Presented to [facility] with fast heart rate (03/06/21). Worked in yard, sweating a lot, missed previous night's metoprolol dose and drank 4 beers. ICD interrogation reassuring. Lasix stopped and instructed to increase PO intake.  Today he returns for HF follow up. Overall feeling fine. Denies increasing SOB, CP, dizziness, edema, or PND/Orthopnea. Appetite ok. No fever or chills. Weight at home 224 pounds. Taking all medications. Off lasix. Drinks 2-3 beers/day.  Cardiac Studies:  - Echo (4/21) EF 15% with severe global HK, and moderate RV dysfunction.  Etiology possible ETOH versus HTN (denies severe ETOH use). TSH ok. HIV NR  - LHC (4/21) w/ severe 1v CAD. RHC with preserved CO. - CABG (5/21) x 1 w/ LIMA-LAD.  - Echo (6/21): EF 25-30%, RV mildly reduced.  - Echo (9/21): EF 25-30%  - ICD (12/21)  Past Medical History:  Diagnosis Date   CHF (congestive heart failure) (HCC)    Gout    No blood products 01/27/2020    Current  Outpatient Medications  Medication Sig Dispense Refill   aspirin 81 MG chewable tablet Chew 1 tablet (81 mg total) by mouth daily.     atorvastatin (LIPITOR) 20 MG tablet Take 1 tablet (20 mg total) by mouth daily. 90 tablet 3   dapagliflozin propanediol (FARXIGA) 10 MG TABS tablet Take 1 tablet (10 mg total) by mouth daily. 90 tablet 3   metoprolol succinate (TOPROL XL) 25 MG 24 hr tablet Take 2 tablets (50 mg total) by mouth daily. 180 tablet 3   sacubitril-valsartan (ENTRESTO) 97-103 MG Take 1 tablet by mouth 2 (two) times daily. 180 tablet 3   spironolactone (ALDACTONE) 25 MG tablet TAKE 1/2 TABLET(12.5 MG) BY MOUTH DAILY 45 tablet 3   furosemide (LASIX) 40 MG tablet TAKE 1 TABLET(40 MG) BY MOUTH DAILY (Patient not taking: Reported on 03/11/2021) 90 tablet 3   No current facility-administered medications for this encounter.    Allergies  Allergen Reactions   Other Other (See Comments)    No blood products      Social History   Socioeconomic History   Marital status: Single    Spouse name: Not on file   Number of children: Not on file   Years of education: Not on file   Highest education level: Not on file  Occupational History   Not on file  Tobacco Use   Smoking status: Never   Smokeless tobacco:  Never  Substance and Sexual Activity   Alcohol use: Yes    Comment: has 1-2 drinks every 2-3 days   Drug use: Not on file   Sexual activity: Not on file  Other Topics Concern   Not on file  Social History Narrative   Not on file   Social Determinants of Health   Financial Resource Strain: Not on file  Food Insecurity: Not on file  Transportation Needs: Not on file  Physical Activity: Not on file  Stress: Not on file  Social Connections: Not on file  Intimate Partner Violence: Not on file     Family History  Problem Relation Age of Onset   Gout Sister     Vitals:   03/11/21 1055  BP: (!) 122/98  Pulse: 93  SpO2: 98%  Weight: 103.4 kg (228 lb)     Wt  Readings from Last 3 Encounters:  03/11/21 103.4 kg (228 lb)  03/06/21 102.1 kg (225 lb)  02/12/21 104.7 kg (230 lb 12.8 oz)    PHYSICAL EXAM: General:  NAD. No resp difficulty HEENT: Normal Neck: Supple. No JVD. Carotids 2+ bilat; no bruits. No lymphadenopathy or thryomegaly appreciated. Cor: PMI nondisplaced. Regular rate & rhythm. No rubs, gallops or murmurs. Lungs: Clear Abdomen: Soft, nontender, nondistended. No hepatosplenomegaly. No bruits or masses. Good bowel sounds. Extremities: No cyanosis, clubbing, rash, edema Neuro: alert & oriented x 3, cranial nerves grossly intact. Moves all 4 extremities w/o difficulty. Affect pleasant.  ICD interrogation: No VT or AF. Fluid index below threshold. Activity level 4hr/day (Personally reviewed).  ECG:  SR 90 bpm (personally reviewed).  ASSESSMENT & PLAN: 1. Chronic Systolic Heart Failure - Echo (4/21) EF 15% with severe global HK, and moderate RV dysfunction.  Etiology possible ETOH versus HTN (denies severe ETOH use). TSH ok. HIV NR  - LHC (4/28) w/ severe 1v CAD. RHC with preserved CO. - s/p CABG (5/21) x 1 w/ LIMA-LAD.  - Echo (6/21): EF 25-30%, RV mildly reduced.  - Echo (9/21): EF 25-30%  - s/p ICD (12/21) - Stable NYHA II. Volume ok. - Continue Entresto 97-103 mg bid.  - Continue Farxiga 10 mg daily.  - Continue spiro 12.5 mg daily.  - Continue Toprol XL 25 mg bid. - Lasix stopped due to volume depletion. - BMET today. - RTC in 5 months with echo.   2. CAD:  - LHC (4/21) w/ severe 1V CAD. - S/p CABG x 1 (5/21) w/ LIMA-LAD. - No s/s angina. - Continue ASA 81 mg daily. - Continue atorva 20.mg daily. - Continue Toprol XL 25 mg bid.  - Lipids (5/22) ok.  3. H/o Atrial Flutter  - s/p ablation 6/21 by [name]. Periods of junctional rhythm post ablation- off amio and dig -> improved.  - Now s/p ICD. - Maintaining SR on EKG today.  - No longer on Eliquis.  4. Sinus Tach  - Resolved. - Continue Toprol XL 25 mg  bid.  5. ETOH - Counseled on cessation/cutting back.  - Consider stopping Farxiga if ETOH consumption increases. - Check Mag today.   Follow up with [name] in 5 months with echo.   Anderson Malta Whitehouse, FNP-BC 03/11/21

## 2021-03-11 ENCOUNTER — Ambulatory Visit (HOSPITAL_COMMUNITY)
Admission: RE | Admit: 2021-03-11 | Discharge: 2021-03-11 | Disposition: A | Discharge status: Home / Self Care | Payer: Medicaid Other | Source: Ambulatory Visit | Attending: Family Medicine | Admitting: Family Medicine

## 2021-03-11 ENCOUNTER — Encounter (HOSPITAL_COMMUNITY): Payer: Self-pay

## 2021-03-11 ENCOUNTER — Other Ambulatory Visit: Payer: Self-pay

## 2021-03-11 VITALS — BP 122/98 | HR 93 | Wt 228.0 lb

## 2021-03-11 DIAGNOSIS — I5022 Chronic systolic (congestive) heart failure: Secondary | ICD-10-CM | POA: Diagnosis not present

## 2021-03-11 DIAGNOSIS — Z7289 Other problems related to lifestyle: Secondary | ICD-10-CM

## 2021-03-11 DIAGNOSIS — Z7982 Long term (current) use of aspirin: Secondary | ICD-10-CM | POA: Insufficient documentation

## 2021-03-11 DIAGNOSIS — Z951 Presence of aortocoronary bypass graft: Secondary | ICD-10-CM | POA: Insufficient documentation

## 2021-03-11 DIAGNOSIS — R000 Tachycardia, unspecified: Secondary | ICD-10-CM

## 2021-03-11 DIAGNOSIS — I2510 Atherosclerotic heart disease of native coronary artery without angina pectoris: Secondary | ICD-10-CM

## 2021-03-11 DIAGNOSIS — Z789 Other specified health status: Secondary | ICD-10-CM

## 2021-03-11 DIAGNOSIS — Z95810 Presence of automatic (implantable) cardiac defibrillator: Secondary | ICD-10-CM | POA: Insufficient documentation

## 2021-03-11 DIAGNOSIS — Z79899 Other long term (current) drug therapy: Secondary | ICD-10-CM | POA: Insufficient documentation

## 2021-03-11 DIAGNOSIS — I4892 Unspecified atrial flutter: Secondary | ICD-10-CM | POA: Diagnosis not present

## 2021-03-11 DIAGNOSIS — F1090 Alcohol use, unspecified, uncomplicated: Secondary | ICD-10-CM

## 2021-03-11 LAB — BASIC METABOLIC PANEL
Anion gap: 7 (ref 5–15)
BUN: 15 mg/dL (ref 8–23)
CO2: 24 mmol/L (ref 22–32)
Calcium: 9.2 mg/dL (ref 8.9–10.3)
Chloride: 106 mmol/L (ref 98–111)
Creatinine, Ser: 1.06 mg/dL (ref 0.61–1.24)
GFR, Estimated: >60 mL/min (ref >60)
Glucose, Bld: 107 mg/dL — ABNORMAL HIGH (ref 70–99)
Potassium: 4.1 mmol/L (ref 3.5–5.1)
Sodium: 137 mmol/L (ref 135–145)

## 2021-03-11 LAB — MAGNESIUM: Magnesium: 2.2 mg/dL (ref 1.7–2.4)

## 2021-03-11 NOTE — Patient Instructions (Signed)
It was great to see you today! No medication changes are needed at this time.  Labs today We will only contact you if something comes back abnormal or we need to make some changes. Otherwise no news is good news!  Your physician recommends that you schedule a follow-up appointment in: 5-6 months with Dr Gala Romney and echo  Your physician has requested that you have an echocardiogram. Echocardiography is a painless test that uses sound waves to create images of your heart. It provides your doctor with information about the size and shape of your heart and how well your heart's chambers and valves are working. This procedure takes approximately one hour. There are no restrictions for this procedure.  Do the following things EVERYDAY: Weigh yourself in the morning before breakfast. Write it down and keep it in a log. Take your medicines as prescribed Eat low salt foods--Limit salt (sodium) to 2000 mg per day.  Stay as active as you can everyday Limit all fluids for the day to less than 2 liters  At the [facility], you and your health needs are our priority. As part of our continuing mission to provide you with exceptional heart care, we have created designated Provider Care Teams. These Care Teams include your primary Cardiologist (physician) and Advanced Practice Providers (APPs- Physician Assistants and Nurse Practitioners) who all work together to provide you with the care you need, when you need it.   You may see any of the following providers on your designated Care Team at your next follow up: Dr Arvilla Meres Dr Marca Ancona Dr Brandon Melnick, NP Robbie Lis, [place] Mikki Santee Karle Plumber, PharmD   Please be sure to bring in all your medications bottles to every appointment.

## 2021-03-26 ENCOUNTER — Ambulatory Visit (INDEPENDENT_AMBULATORY_CARE_PROVIDER_SITE_OTHER): Payer: Medicaid Other

## 2021-03-26 DIAGNOSIS — I428 Other cardiomyopathies: Secondary | ICD-10-CM

## 2021-03-26 DIAGNOSIS — I5022 Chronic systolic (congestive) heart failure: Secondary | ICD-10-CM | POA: Diagnosis not present

## 2021-03-27 LAB — CUP PACEART REMOTE DEVICE CHECK
Battery Remaining Longevity: 135 mo
Battery Voltage: 3.1 V
Brady Statistic RV Percent Paced: 0.02 %
Date Time Interrogation Session: 20220630114740
HighPow Impedance: 64 Ohm
Implantable Lead Implant Date: 20211228
Implantable Lead Location: 753860
Implantable Pulse Generator Implant Date: 20211228
Lead Channel Impedance Value: 437 Ohm
Lead Channel Impedance Value: 551 Ohm
Lead Channel Pacing Threshold Amplitude: 0.5 V
Lead Channel Pacing Threshold Pulse Width: 0.4 ms
Lead Channel Sensing Intrinsic Amplitude: 23.5 mV
Lead Channel Sensing Intrinsic Amplitude: 23.5 mV
Lead Channel Setting Pacing Amplitude: 2 V
Lead Channel Setting Pacing Pulse Width: 0.4 ms
Lead Channel Setting Sensing Sensitivity: 0.3 mV

## 2021-04-01 ENCOUNTER — Other Ambulatory Visit (HOSPITAL_COMMUNITY): Payer: Self-pay | Admitting: Internal Medicine

## 2021-04-09 NOTE — Progress Notes (Signed)
Remote ICD transmission.   

## 2021-06-25 ENCOUNTER — Ambulatory Visit (INDEPENDENT_AMBULATORY_CARE_PROVIDER_SITE_OTHER): Payer: Medicaid Other

## 2021-06-25 DIAGNOSIS — I428 Other cardiomyopathies: Secondary | ICD-10-CM

## 2021-06-26 LAB — CUP PACEART REMOTE DEVICE CHECK
Battery Remaining Longevity: 133 mo
Battery Voltage: 3.06 V
Brady Statistic RV Percent Paced: 0.01 %
Date Time Interrogation Session: 20220928132636
HighPow Impedance: 59 Ohm
Implantable Lead Implant Date: 20211228
Implantable Lead Location: 753860
Implantable Pulse Generator Implant Date: 20211228
Lead Channel Impedance Value: 399 Ohm
Lead Channel Impedance Value: 494 Ohm
Lead Channel Pacing Threshold Amplitude: 0.5 V
Lead Channel Pacing Threshold Pulse Width: 0.4 ms
Lead Channel Sensing Intrinsic Amplitude: 25.375 mV
Lead Channel Sensing Intrinsic Amplitude: 25.375 mV
Lead Channel Setting Pacing Amplitude: 2 V
Lead Channel Setting Pacing Pulse Width: 0.4 ms
Lead Channel Setting Sensing Sensitivity: 0.3 mV

## 2021-07-02 NOTE — Progress Notes (Signed)
Remote ICD transmission.   

## 2021-07-06 IMAGING — CR DG CHEST 2V
2 series · 2 of 2 positions shown · non-contrast
Comparison: September 24, 2020.

CLINICAL DATA: Shortness of breath.

EXAM:
CHEST - 2 VIEW

[chest pa]
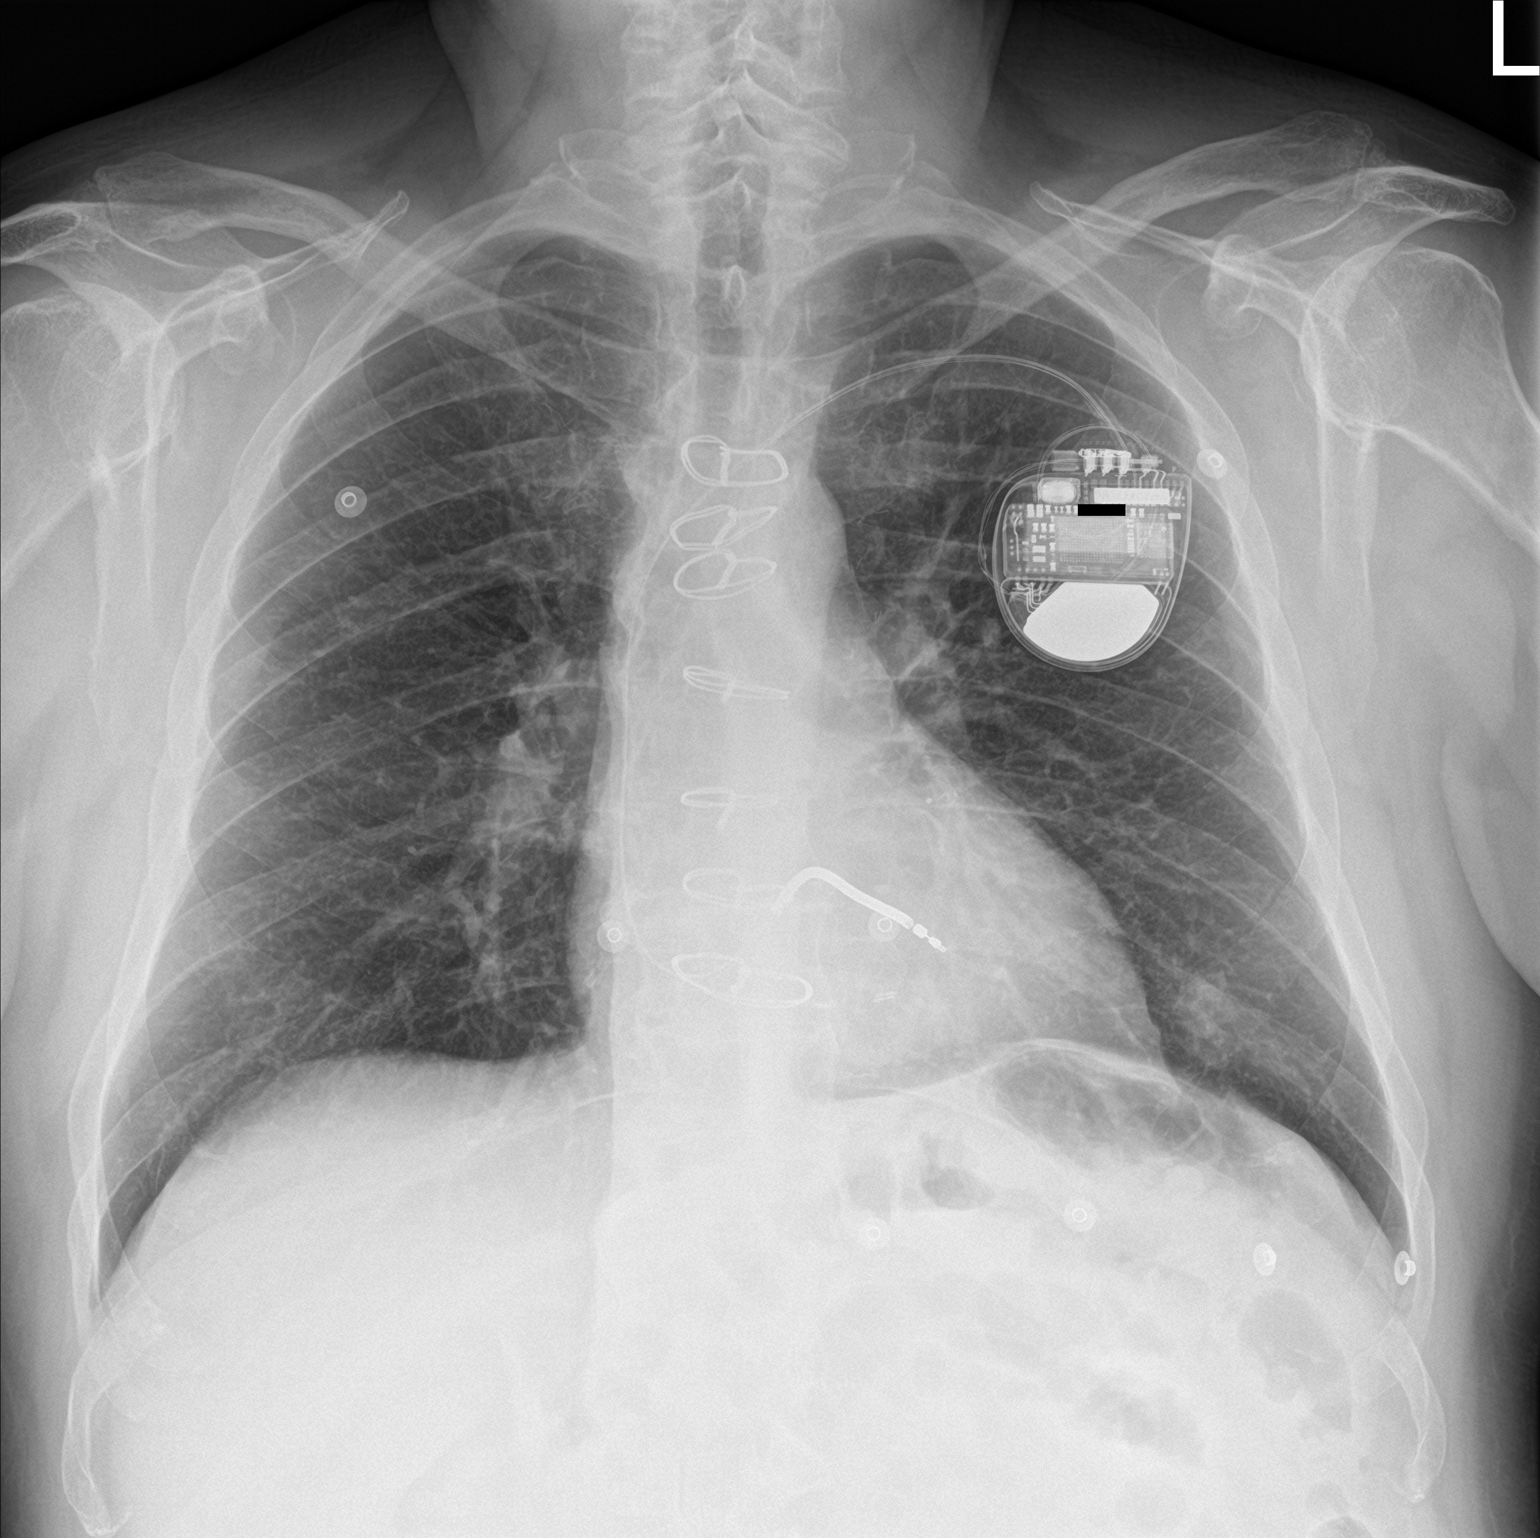

[chest lat]
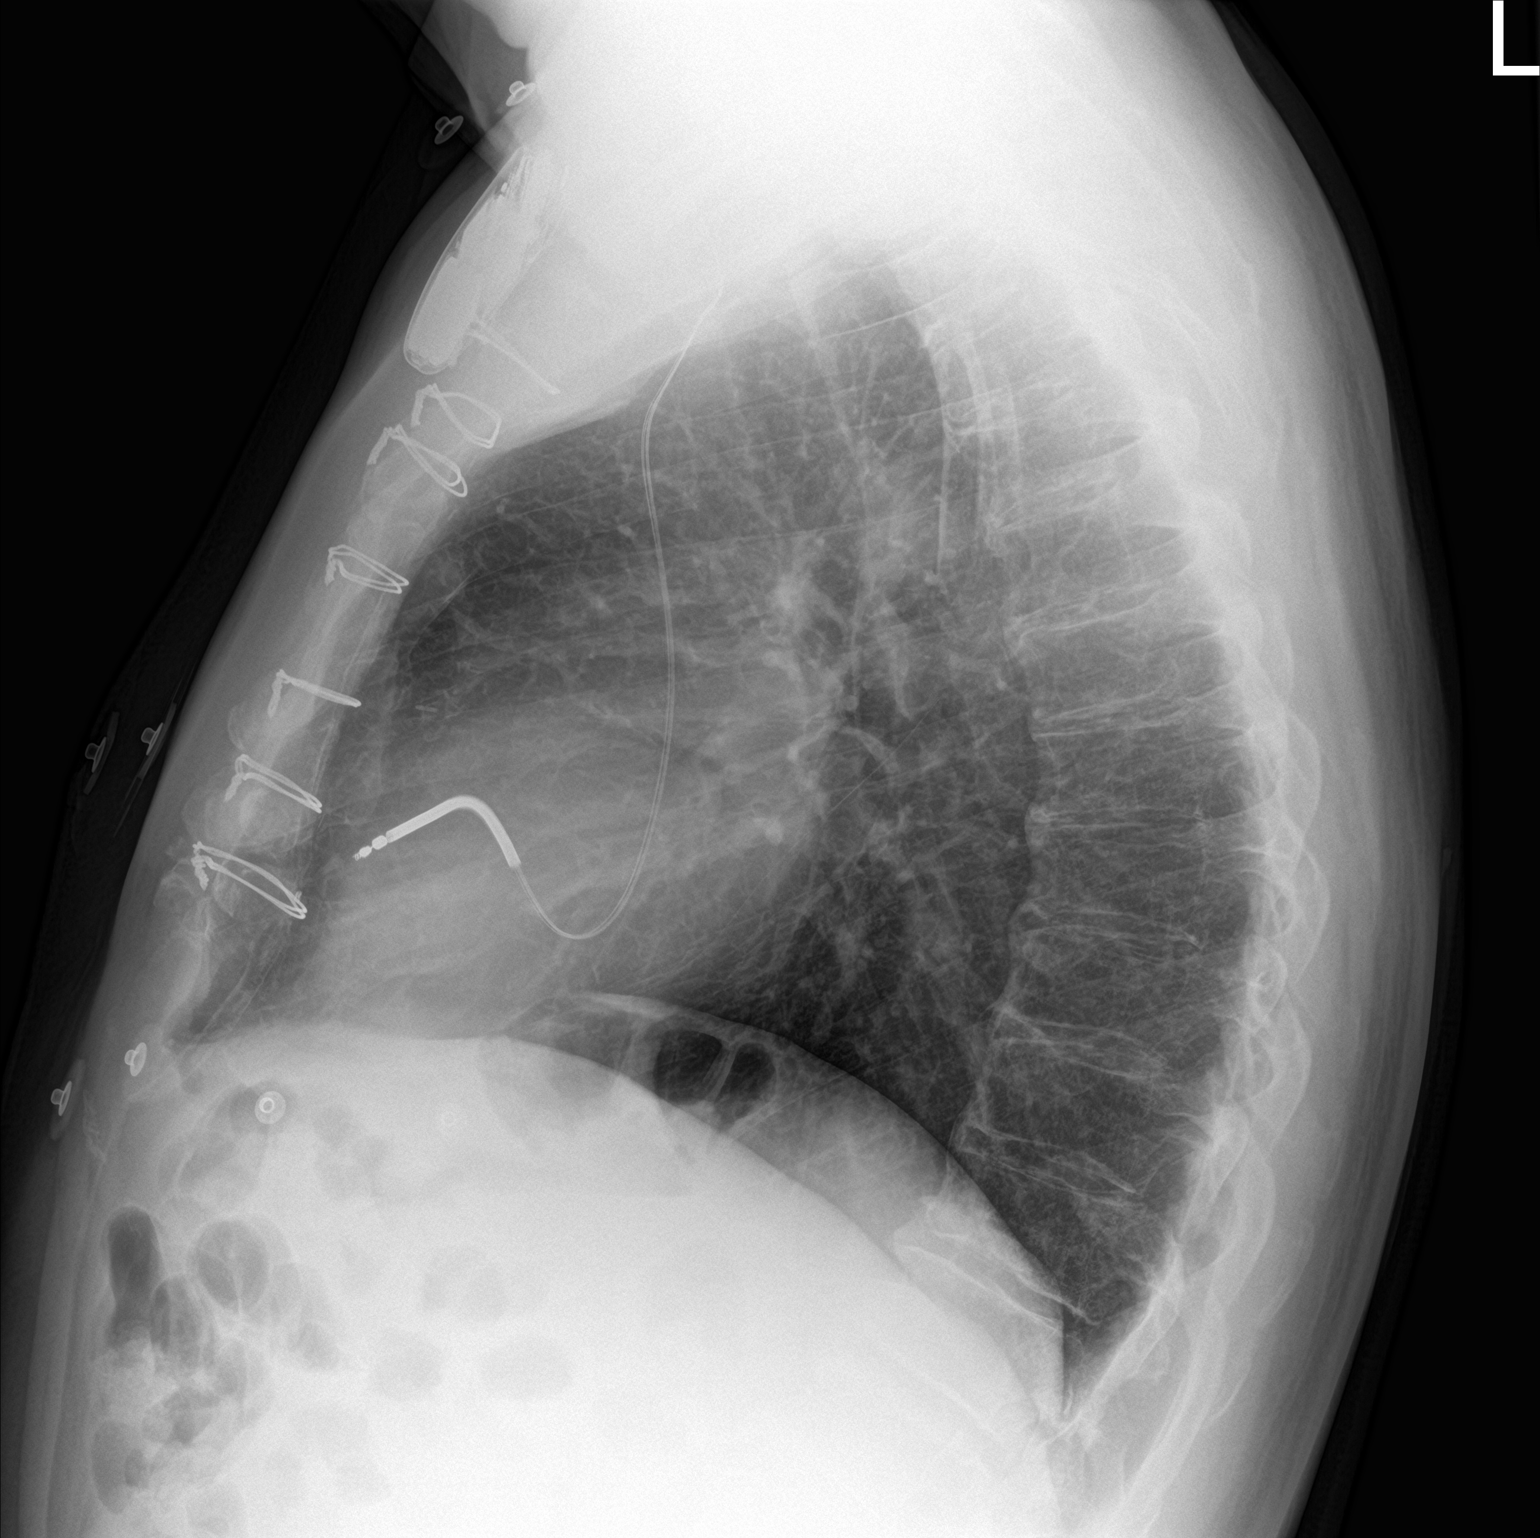

[2 of 2 positions shown; findings below may reference images not displayed]

FINDINGS: The heart size and mediastinal contours are within normal limits.
Sternotomy wires are noted. Left-sided pacemaker is unchanged in
position. Both lungs are clear. The visualized skeletal structures
are unremarkable.
IMPRESSION: No active cardiopulmonary disease.

## 2021-07-08 ENCOUNTER — Other Ambulatory Visit (HOSPITAL_COMMUNITY): Payer: Self-pay

## 2021-07-08 ENCOUNTER — Other Ambulatory Visit (HOSPITAL_COMMUNITY): Payer: Self-pay | Admitting: Internal Medicine

## 2021-09-24 ENCOUNTER — Ambulatory Visit (INDEPENDENT_AMBULATORY_CARE_PROVIDER_SITE_OTHER): Payer: Medicare Other

## 2021-09-24 ENCOUNTER — Other Ambulatory Visit (HOSPITAL_COMMUNITY): Payer: Self-pay

## 2021-09-24 ENCOUNTER — Telehealth (HOSPITAL_COMMUNITY): Payer: Self-pay | Admitting: Pharmacist

## 2021-09-24 DIAGNOSIS — I428 Other cardiomyopathies: Secondary | ICD-10-CM | POA: Diagnosis not present

## 2021-09-24 DIAGNOSIS — I5022 Chronic systolic (congestive) heart failure: Secondary | ICD-10-CM

## 2021-09-24 LAB — CUP PACEART REMOTE DEVICE CHECK
Battery Remaining Longevity: 132 mo
Battery Voltage: 3.04 V
Brady Statistic RV Percent Paced: 0.01 %
Date Time Interrogation Session: 20221228123633
HighPow Impedance: 67 Ohm
Implantable Lead Implant Date: 20211228
Implantable Lead Location: 753860
Implantable Pulse Generator Implant Date: 20211228
Lead Channel Impedance Value: 437 Ohm
Lead Channel Impedance Value: 494 Ohm
Lead Channel Pacing Threshold Amplitude: 0.5 V
Lead Channel Pacing Threshold Pulse Width: 0.4 ms
Lead Channel Sensing Intrinsic Amplitude: 27 mV
Lead Channel Sensing Intrinsic Amplitude: 27 mV
Lead Channel Setting Pacing Amplitude: 2 V
Lead Channel Setting Pacing Pulse Width: 0.4 ms
Lead Channel Setting Sensing Sensitivity: 0.3 mV

## 2021-09-24 NOTE — Telephone Encounter (Signed)
Advanced Heart Failure Patient Advocate Encounter  Prior Authorization/nonformulary exception for Marcelline Deist has been approved through [employer].    PA# [id number] Effective dates: 08/25/21 through 09/24/22  Karle Plumber, PharmD, BCPS, BCCP, CPP Heart Failure Clinic Pharmacist [phone]

## 2021-10-06 NOTE — Progress Notes (Signed)
Remote ICD transmission.   

## 2021-12-01 ENCOUNTER — Other Ambulatory Visit: Payer: Self-pay

## 2021-12-01 MED ORDER — SPIRONOLACTONE 25 MG PO TABS: ORAL_TABLET | ORAL | 3 refills | Status: DC

## 2021-12-24 ENCOUNTER — Ambulatory Visit (INDEPENDENT_AMBULATORY_CARE_PROVIDER_SITE_OTHER): Payer: Medicare Other

## 2021-12-24 DIAGNOSIS — I428 Other cardiomyopathies: Secondary | ICD-10-CM

## 2021-12-25 LAB — CUP PACEART REMOTE DEVICE CHECK
Battery Remaining Longevity: 130 mo
Battery Voltage: 3.03 V
Brady Statistic RV Percent Paced: 0.01 %
Date Time Interrogation Session: 20230330072532
HighPow Impedance: 65 Ohm
Implantable Lead Implant Date: 20211228
Implantable Lead Location: 753860
Implantable Pulse Generator Implant Date: 20211228
Lead Channel Impedance Value: 437 Ohm
Lead Channel Impedance Value: 513 Ohm
Lead Channel Pacing Threshold Amplitude: 0.5 V
Lead Channel Pacing Threshold Pulse Width: 0.4 ms
Lead Channel Sensing Intrinsic Amplitude: 25.875 mV
Lead Channel Sensing Intrinsic Amplitude: 25.875 mV
Lead Channel Setting Pacing Amplitude: 2 V
Lead Channel Setting Pacing Pulse Width: 0.4 ms
Lead Channel Setting Sensing Sensitivity: 0.3 mV

## 2022-01-06 NOTE — Progress Notes (Signed)
Remote ICD transmission.   

## 2022-03-12 ENCOUNTER — Ambulatory Visit (INDEPENDENT_AMBULATORY_CARE_PROVIDER_SITE_OTHER): Payer: Medicare Other | Admitting: Physician Assistant

## 2022-03-12 ENCOUNTER — Encounter: Payer: Self-pay | Admitting: Physician Assistant

## 2022-03-12 VITALS — BP 100/72 | HR 99 | Ht 72.0 in | Wt 217.0 lb

## 2022-03-12 DIAGNOSIS — I255 Ischemic cardiomyopathy: Secondary | ICD-10-CM

## 2022-03-12 DIAGNOSIS — Z95810 Presence of automatic (implantable) cardiac defibrillator: Secondary | ICD-10-CM

## 2022-03-12 DIAGNOSIS — I5022 Chronic systolic (congestive) heart failure: Secondary | ICD-10-CM | POA: Diagnosis not present

## 2022-03-12 DIAGNOSIS — I5023 Acute on chronic systolic (congestive) heart failure: Secondary | ICD-10-CM

## 2022-03-12 DIAGNOSIS — I2510 Atherosclerotic heart disease of native coronary artery without angina pectoris: Secondary | ICD-10-CM

## 2022-03-12 DIAGNOSIS — I484 Atypical atrial flutter: Secondary | ICD-10-CM

## 2022-03-12 LAB — CUP PACEART INCLINIC DEVICE CHECK
Battery Remaining Longevity: 129 mo
Battery Voltage: 3.02 V
Brady Statistic RV Percent Paced: 0.01 %
Date Time Interrogation Session: 20230615170659
HighPow Impedance: 65 Ohm
Implantable Lead Implant Date: 20211228
Implantable Lead Location: 753860
Implantable Pulse Generator Implant Date: 20211228
Lead Channel Impedance Value: 437 Ohm
Lead Channel Impedance Value: 513 Ohm
Lead Channel Pacing Threshold Amplitude: 0.5 V
Lead Channel Pacing Threshold Pulse Width: 0.4 ms
Lead Channel Sensing Intrinsic Amplitude: 25.875 mV
Lead Channel Sensing Intrinsic Amplitude: 29.625 mV
Lead Channel Setting Pacing Amplitude: 2 V
Lead Channel Setting Pacing Pulse Width: 0.4 ms
Lead Channel Setting Sensing Sensitivity: 0.3 mV

## 2022-03-12 NOTE — Patient Instructions (Signed)
Medication Instructions:   Your physician recommends that you continue on your current medications as directed. Please refer to the Current Medication list given to you today.  *If you need a refill on your cardiac medications before your next appointment, please call your pharmacy*   Lab Work:  NONE ORDERED  TODAY   If you have labs (blood work) drawn today and your tests are completely normal, you will receive your results only by: MyChart Message (if you have MyChart) OR A paper copy in the mail If you have any lab test that is abnormal or we need to change your treatment, we will call you to review the results.   Testing/Procedures: NONE ORDERED  TODAY    Follow-Up: At [facility], you and your health needs are our priority.  As part of our continuing mission to provide you with exceptional heart care, we have created designated Provider Care Teams.  These Care Teams include your primary Cardiologist (physician) and Advanced Practice Providers (APPs -  Physician Assistants and Nurse Practitioners) who all work together to provide you with the care you need, when you need it.  We recommend signing up for the patient portal called "MyChart".  Sign up information is provided on this After Visit Summary.  MyChart is used to connect with patients for Virtual Visits (Telemedicine).  Patients are able to view lab/test results, encounter notes, upcoming appointments, etc.  Non-urgent messages can be sent to your provider as well.   To learn more about what you can do with MyChart, go to [email or web address].    Your next appointment:   1 year(s)  The format for your next appointment:   In Person  Provider:   You may see Will Martin Camnitz, MD or one of the following Advanced Practice Providers on your designated Care Team:   Renee Ursuy, PA-C Michael "Andy" Tillery, PA-C{    Other Instructions   Important Information About Sugar       

## 2022-03-12 NOTE — Progress Notes (Signed)
Cardiology Office Note Date:  03/12/2022  Patient ID:  Corey Brady, Corey Brady 1956-09-15, MRN [id number] PCP:  Marcine Matar, MD  Cardiologist:  [name] Electrophysiologist: [name]   Chief Complaint  over ue  History of Present Illness: Corey Brady is a 66 y.o. male with history of HTN, CAD (single vessel CABG), NICM (?ETOH induced vs ischemic), AFlutter (ablated 03/05/20), ICD, chronic CHF (systolic).  He comes in today to be seen for [name], last seen by him April 2022, doing well, no changes were made.  He was last seen by HF team June 2022 by Everlean Alstrom, NP, had a recent ER Visit than with palpitations, sounds like probably volume contracted after working in the yard, drinking beer and had missed a few days of meds (metoprolol), lasix stopped. Off a/c post flutter ablation No changes were made.   TODAY He is doing very well 4 mo ago started to exercise, mostly weights, gets his HR up as well, eating healthier also. No CP, palpitations or cardiac awareness, no SOB. No near syncope or syncope. No shocks  He just had labs done via his PMD has an appointment for physical and results coming up.   Device information MDT single chamber ICD implanted 09/24/2020  AAD hx Amiodarone fairly brief for AFlutter prior to ablation 2021  Past Medical History:  Diagnosis Date   CHF (congestive heart failure) (HCC)    Gout    No blood products 01/27/2020    Past Surgical History:  Procedure Laterality Date   A-FLUTTER ABLATION N/A 03/15/2020   Procedure: A-FLUTTER ABLATION;  Surgeon: Regan Lemming, MD;  Location: [facility] INVASIVE CV LAB;  Service: Cardiovascular;  Laterality: N/A;   CARDIOVERSION N/A 02/06/2020   Procedure: CARDIOVERSION;  Surgeon: Dolores Patty, MD;  Location: [facility] ENDOSCOPY;  Service: Cardiovascular;  Laterality: N/A;   CORONARY ARTERY BYPASS GRAFT N/A 01/30/2020   Procedure: OFF PUMP CORONARY ARTERY BYPASS GRAFTING (CABG) TIMES ONE;  Surgeon:  Corliss Skains, MD;  Location: [facility] OR;  Service: Open Heart Surgery;  Laterality: N/A;  swan only   ICD IMPLANT N/A 09/24/2020   Procedure: ICD IMPLANT;  Surgeon: Regan Lemming, MD;  Location: [facility] INVASIVE CV LAB;  Service: Cardiovascular;  Laterality: N/A;   RIGHT/LEFT HEART CATH AND CORONARY ANGIOGRAPHY N/A 01/26/2020   Procedure: RIGHT/LEFT HEART CATH AND CORONARY ANGIOGRAPHY;  Surgeon: Dolores Patty, MD;  Location: [facility] INVASIVE CV LAB;  Service: Cardiovascular;  Laterality: N/A;   TEE WITHOUT CARDIOVERSION N/A 01/30/2020   Procedure: TRANSESOPHAGEAL ECHOCARDIOGRAM (TEE);  Surgeon: Corliss Skains, MD;  [facility] OR;  Service: Open Heart Surgery;  Laterality: N/A;   TEE WITHOUT CARDIOVERSION N/A 02/06/2020   Procedure: TRANSESOPHAGEAL ECHOCARDIOGRAM (TEE);  Surgeon: Dolores Patty, MD;  Location: [facility] ENDOSCOPY;  Service: Cardiovascular;  Laterality: N/A;    Current Outpatient Medications  Medication Sig Dispense Refill   aspirin 81 MG chewable tablet Chew 1 tablet (81 mg total) by mouth daily.     dapagliflozin propanediol (FARXIGA) 10 MG TABS tablet Take 1 tablet (10 mg total) by mouth daily. 90 tablet 3   ENTRESTO 97-103 MG TAKE 1 TABLET BY MOUTH TWICE DAILY 60 tablet 11   metoprolol succinate (TOPROL XL) 25 MG 24 hr tablet Take 2 tablets (50 mg total) by mouth daily. 180 tablet 3   spironolactone (ALDACTONE) 25 MG tablet TAKE 1/2 TABLET(12.5 MG) BY MOUTH DAILY, NEEDS APPOINTMENT FOR ANYMORE REFILLS 45 tablet 3   atorvastatin (LIPITOR) 20 MG  tablet Take 1 tablet (20 mg total) by mouth daily. 90 tablet 3   furosemide (LASIX) 40 MG tablet TAKE 1 TABLET(40 MG) BY MOUTH DAILY (Patient not taking: Reported on 03/12/2022) 90 tablet 3   No current facility-administered medications for this visit.    Allergies:   Other   Social History:  The patient  reports that he has never smoked. He has never used smokeless tobacco. He reports current alcohol use.   Family History:   The patient's family history includes Gout in his sister.  ROS:  Please see the history of present illness.    All other systems are reviewed and otherwise negative.   PHYSICAL EXAM:  VS:  BP 100/72   Pulse 99   Ht 6' (1.829 m)   Wt 217 lb (98.4 kg)   SpO2 95%   BMI 29.43 kg/m  BMI: Body mass index is 29.43 kg/m. Well nourished, well developed, in no acute distress HEENT: normocephalic, atraumatic Neck: no JVD, carotid bruits or masses Cardiac:  RRR; no significant murmurs, no rubs, or gallops Lungs:  CTA b/l, no wheezing, rhonchi or rales Abd: soft, nontender MS: no deformity or atrophy Ext: no edema Skin: warm and dry, no rash Neuro:  No gross deficits appreciated Psych: euthymic mood, full affect  ICD site is stable, no tethering or discomfort   EKG:  Done today and reviewed by myself shows  SR 99bpm, no changes  Device interrogation done today and reviewed by myself:  Battery and lead measurements are good One event labeled NSVT by morphology was supraventricular and < 1 second   06/05/2020: TTE  1. Global hypokinesis worse in the septal myocardium. Left ventricular  ejection fraction, by estimation, is 25 to 30%. The left ventricle has  severely decreased function. The left ventricle demonstrates global  hypokinesis. Left ventricular diastolic  parameters are consistent with Grade I diastolic dysfunction (impaired  relaxation).   2. Right ventricular systolic function is moderately reduced. The right  ventricular size is normal.   3. The mitral valve is normal in structure. No evidence of mitral valve  regurgitation. No evidence of mitral stenosis.   4. The aortic valve is tricuspid. Aortic valve regurgitation is not  visualized. No aortic stenosis is present.   5. The inferior vena cava is normal in size with greater than 50%  respiratory variability, suggesting right atrial pressure of 3 mmHg  01/26/2020: R/LHC Ao = 101/72 (84) LV = 101/16 RA = 7 RV =  44/10 PA = 44/22 (31) PCW = 13 Fick cardiac output/index = 6.0/2.6 PVR = 3.0 WU FA sat = 99% PA sat = 73%, 74%   Assessment: 1. Severe 1v CAD with ostially occlusion of LAD with prominent R->L collaterals and good target 2. Ischemic CM with EF 20%  3. Well preserved hemodynamics   01/23/2020: TTE 1. Severe global reduction in LV systolic function; restrictive filling;  mild LVE; biatrial enlargement; moderate RV dysfunction; mild MR.   2. Left ventricular ejection fraction, by estimation, is <20%. The left  ventricle has severely decreased function. The left ventricle demonstrates  global hypokinesis. The left ventricular internal cavity size was mildly  dilated. Left ventricular  diastolic parameters are consistent with Grade III diastolic dysfunction  (restrictive).   3. Right ventricular systolic function is moderately reduced. The right  ventricular size is normal. There is moderately elevated pulmonary artery  systolic pressure.   4. Left atrial size was severely dilated.   5. Right atrial size was  mildly dilated.   6. The mitral valve is normal in structure. Mild mitral valve  regurgitation. No evidence of mitral stenosis.   7. The aortic valve is tricuspid. Aortic valve regurgitation is not  visualized. Mild aortic valve sclerosis is present, with no evidence of  aortic valve stenosis.   8. The inferior vena cava is dilated in size with <50% respiratory  variability, suggesting right atrial pressure of 15 mmHg.   Recent Labs: No results found for requested labs within last 365 days.  No results found for requested labs within last 365 days.   CrCl cannot be calculated (Patient's most recent lab result is older than the maximum 21 days allowed.).   Wt Readings from Last 3 Encounters:  03/12/22 217 lb (98.4 kg)  03/11/21 228 lb (103.4 kg)  03/06/21 225 lb (102.1 kg)     Other studies reviewed: Additional studies/records reviewed today include: summarized  above  ASSESSMENT AND PLAN:  ICD Intact function, no changes made  CM (? Ischemic) Chronic CHF (systolic) No symptoms or exam findings of volume OL OptiVol looks good On BB, Entresto, spironolactone Over due to see HF team  CAD No symptoms On ASA, statin, BB  AFlutter Ablated, off a/c no AF  Disposition: F/u with remotes as usual, in clinic with EP in a year, sooner if needed.  Current medicines are reviewed at length with the patient today.  The patient did not have any concerns regarding medicines.  Venetia Night, PA-C 03/12/2022 3:05 PM     [address]  [place] [phone] (office)  [phone] (fax)

## 2022-03-13 ENCOUNTER — Other Ambulatory Visit: Payer: Self-pay

## 2022-03-13 ENCOUNTER — Other Ambulatory Visit (HOSPITAL_COMMUNITY): Payer: Self-pay | Admitting: Internal Medicine

## 2022-03-13 MED ORDER — ATORVASTATIN CALCIUM 20 MG PO TABS: 20.0000 mg | ORAL_TABLET | Freq: Every day | ORAL | 3 refills | Status: DC

## 2022-03-25 ENCOUNTER — Ambulatory Visit (INDEPENDENT_AMBULATORY_CARE_PROVIDER_SITE_OTHER): Payer: Medicare Other

## 2022-03-25 DIAGNOSIS — I255 Ischemic cardiomyopathy: Secondary | ICD-10-CM

## 2022-03-25 LAB — CUP PACEART REMOTE DEVICE CHECK
Battery Remaining Longevity: 128 mo
Battery Voltage: 3.03 V
Brady Statistic RV Percent Paced: 0 %
Date Time Interrogation Session: 20230628033524
HighPow Impedance: 64 Ohm
Implantable Lead Implant Date: 20211228
Implantable Lead Location: 753860
Implantable Pulse Generator Implant Date: 20211228
Lead Channel Impedance Value: 380 Ohm
Lead Channel Impedance Value: 456 Ohm
Lead Channel Pacing Threshold Amplitude: 0.5 V
Lead Channel Pacing Threshold Pulse Width: 0.4 ms
Lead Channel Sensing Intrinsic Amplitude: 21.625 mV
Lead Channel Sensing Intrinsic Amplitude: 21.625 mV
Lead Channel Setting Pacing Amplitude: 2 V
Lead Channel Setting Pacing Pulse Width: 0.4 ms
Lead Channel Setting Sensing Sensitivity: 0.3 mV

## 2022-04-14 NOTE — Progress Notes (Signed)
Remote ICD transmission.   

## 2022-06-15 ENCOUNTER — Other Ambulatory Visit: Payer: Self-pay | Admitting: *Deleted

## 2022-06-15 MED ORDER — DAPAGLIFLOZIN PROPANEDIOL 10 MG PO TABS: 10.0000 mg | ORAL_TABLET | Freq: Every day | ORAL | 0 refills | Status: DC

## 2022-06-15 MED ORDER — METOPROLOL SUCCINATE ER 25 MG PO TB24: 50.0000 mg | ORAL_TABLET | Freq: Every day | ORAL | 0 refills | Status: DC

## 2022-06-15 MED ORDER — FUROSEMIDE 40 MG PO TABS: ORAL_TABLET | ORAL | 0 refills | Status: DC

## 2022-06-24 ENCOUNTER — Ambulatory Visit (INDEPENDENT_AMBULATORY_CARE_PROVIDER_SITE_OTHER): Payer: Medicare Other

## 2022-06-24 DIAGNOSIS — I255 Ischemic cardiomyopathy: Secondary | ICD-10-CM

## 2022-06-24 LAB — CUP PACEART REMOTE DEVICE CHECK
Battery Remaining Longevity: 126 mo
Battery Voltage: 3.03 V
Brady Statistic RV Percent Paced: 0.01 %
Date Time Interrogation Session: 20230927033423
HighPow Impedance: 69 Ohm
Implantable Lead Implant Date: 20211228
Implantable Lead Location: 753860
Implantable Pulse Generator Implant Date: 20211228
Lead Channel Impedance Value: 399 Ohm
Lead Channel Impedance Value: 494 Ohm
Lead Channel Pacing Threshold Amplitude: 0.5 V
Lead Channel Pacing Threshold Pulse Width: 0.4 ms
Lead Channel Sensing Intrinsic Amplitude: 23.25 mV
Lead Channel Sensing Intrinsic Amplitude: 23.25 mV
Lead Channel Setting Pacing Amplitude: 2 V
Lead Channel Setting Pacing Pulse Width: 0.4 ms
Lead Channel Setting Sensing Sensitivity: 0.3 mV

## 2022-07-01 ENCOUNTER — Other Ambulatory Visit (HOSPITAL_COMMUNITY): Payer: Self-pay

## 2022-07-01 MED ORDER — ENTRESTO 97-103 MG PO TABS: 1.0000 | ORAL_TABLET | Freq: Two times a day (BID) | ORAL | 0 refills | Status: DC

## 2022-07-02 NOTE — Progress Notes (Signed)
Remote ICD transmission.   

## 2022-07-21 ENCOUNTER — Encounter (HOSPITAL_COMMUNITY): Payer: Self-pay

## 2022-07-21 ENCOUNTER — Ambulatory Visit (HOSPITAL_COMMUNITY)
Admission: RE | Admit: 2022-07-21 | Discharge: 2022-07-21 | Disposition: A | Discharge status: Home / Self Care | Payer: Medicare Other | Source: Ambulatory Visit | Attending: Family Medicine | Admitting: Family Medicine

## 2022-07-21 VITALS — BP 100/74 | HR 90 | Resp 96 | Wt 226.0 lb

## 2022-07-21 DIAGNOSIS — Z951 Presence of aortocoronary bypass graft: Secondary | ICD-10-CM | POA: Diagnosis not present

## 2022-07-21 DIAGNOSIS — Z7901 Long term (current) use of anticoagulants: Secondary | ICD-10-CM | POA: Insufficient documentation

## 2022-07-21 DIAGNOSIS — I2510 Atherosclerotic heart disease of native coronary artery without angina pectoris: Secondary | ICD-10-CM | POA: Diagnosis not present

## 2022-07-21 DIAGNOSIS — Z79899 Other long term (current) drug therapy: Secondary | ICD-10-CM | POA: Diagnosis not present

## 2022-07-21 DIAGNOSIS — Z7984 Long term (current) use of oral hypoglycemic drugs: Secondary | ICD-10-CM | POA: Diagnosis not present

## 2022-07-21 DIAGNOSIS — Z7982 Long term (current) use of aspirin: Secondary | ICD-10-CM | POA: Insufficient documentation

## 2022-07-21 DIAGNOSIS — I5022 Chronic systolic (congestive) heart failure: Secondary | ICD-10-CM | POA: Diagnosis present

## 2022-07-21 DIAGNOSIS — Z789 Other specified health status: Secondary | ICD-10-CM

## 2022-07-21 DIAGNOSIS — I484 Atypical atrial flutter: Secondary | ICD-10-CM

## 2022-07-21 DIAGNOSIS — R000 Tachycardia, unspecified: Secondary | ICD-10-CM

## 2022-07-21 LAB — BASIC METABOLIC PANEL
Anion gap: 7 (ref 5–15)
BUN: 11 mg/dL (ref 8–23)
CO2: 24 mmol/L (ref 22–32)
Calcium: 9.3 mg/dL (ref 8.9–10.3)
Chloride: 106 mmol/L (ref 98–111)
Creatinine, Ser: 1.08 mg/dL (ref 0.61–1.24)
GFR, Estimated: >60 mL/min (ref >60)
Glucose, Bld: 103 mg/dL — ABNORMAL HIGH (ref 70–99)
Potassium: 4.5 mmol/L (ref 3.5–5.1)
Sodium: 137 mmol/L (ref 135–145)

## 2022-07-21 LAB — BRAIN NATRIURETIC PEPTIDE: B Natriuretic Peptide: 57.2 pg/mL (ref 0.0–100.0)

## 2022-07-21 MED ORDER — ATORVASTATIN CALCIUM 20 MG PO TABS: 20.0000 mg | ORAL_TABLET | Freq: Every day | ORAL | 3 refills | Status: DC

## 2022-07-21 MED ORDER — DAPAGLIFLOZIN PROPANEDIOL 10 MG PO TABS: 10.0000 mg | ORAL_TABLET | Freq: Every day | ORAL | 3 refills | Status: DC

## 2022-07-21 MED ORDER — ENTRESTO 97-103 MG PO TABS: 1.0000 | ORAL_TABLET | Freq: Two times a day (BID) | ORAL | 3 refills | Status: DC

## 2022-07-21 MED ORDER — METOPROLOL SUCCINATE ER 50 MG PO TB24: 50.0000 mg | ORAL_TABLET | Freq: Every day | ORAL | 3 refills | Status: DC

## 2022-07-21 MED ORDER — SPIRONOLACTONE 25 MG PO TABS: 25.0000 mg | ORAL_TABLET | Freq: Every day | ORAL | 3 refills | Status: DC

## 2022-07-21 NOTE — Patient Instructions (Signed)
It was great to see you today! No medication changes are needed at this time.  Labs today We will only contact you if something comes back abnormal or we need to make some changes. Otherwise no news is good news!  Please be sure to call your primary care provider, Dr Wynetta Emery, for an appointment   Your physician wants you to follow-up in: 3-4 months with Dr Haroldine Laws and ehco. You will receive a reminder letter in the mail two months in advance. If you don't receive a letter, please call our office to schedule the follow-up appointment.   Your physician has requested that you have an echocardiogram. Echocardiography is a painless test that uses sound waves to create images of your heart. It provides your doctor with information about the size and shape of your heart and how well your heart's chambers and valves are working. This procedure takes approximately one hour. There are no restrictions for this procedure. Please do NOT wear cologne, perfume, aftershave, or lotions (deodorant is allowed). Please arrive 15 minutes prior to your appointment time.  Do the following things EVERYDAY: Weigh yourself in the morning before breakfast. Write it down and keep it in a log. Take your medicines as prescribed Eat low salt foods--Limit salt (sodium) to 2000 mg per day.  Stay as active as you can everyday Limit all fluids for the day to less than 2 liters  At the [facility], you and your health needs are our priority. As part of our continuing mission to provide you with exceptional heart care, we have created designated Provider Care Teams. These Care Teams include your primary Cardiologist (physician) and Advanced Practice Providers (APPs- Physician Assistants and Nurse Practitioners) who all work together to provide you with the care you need, when you need it.   You may see any of the following providers on your designated Care Team at your next follow up: Dr Glori Bickers Dr Loralie Champagne Dr. Roxana Hires, NP Lyda Jester, [place] [facility] [place], [place] Forestine Na, NP Audry Riles, PharmD   Please be sure to bring in all your medications bottles to every appointment.

## 2022-07-21 NOTE — Addendum Note (Signed)
Encounter addended by: Kerry Dory, CMA on: 07/21/2022 3:14 PM  Actions taken: Alternative orders not taken and original order placed, Order list changed

## 2022-07-21 NOTE — Progress Notes (Signed)
Advanced Heart Failure Clinic Note   PCP: [name] EP: [name] HF Cardiologist:  Dr. Gala Brady   HPI:  Corey Brady is a 66 y.o. male w/ CAD, severe systolic HF and PAFL. Underwent CABG 5/21. Post-op course c/b low output and PAFL requiring several DC-CV. Was placed on amiodarone.     Readmitted for HF 6/21 for a/c CHF w/ volume overload, recurrent AFL and bilateral LE cellulitis.     He was loaded on IV amiodarone, started on IV lasix for CHF and started on IV vanc for cellulitis. EP was consulted for AFL and he underwent  AFL ablation 03/16/20. Post ablation had periods of junctional rhythm. Dig and amio held and junctional rhythm resolved.  He had EP f/u w/ [name] 04/25/20 and was in NSR. Eliquis was discontinued and low dose metoprolol was added. He has done well since and has been followed closely and meds optimized.   He underwent ICD implant on 09/24/20  Saw Dr Leory Plowman May 2022 and was instructed to increase Toprol XL to 25 mg bid.    Presented to [facility] with fast heart rate (03/06/21). Worked in yard, sweating a lot, missed previous night's metoprolol dose and drank 4 beers. ICD interrogation reassuring. Lasix stopped and instructed to increase PO intake.  Today he returns for HF follow up. Last seen 6/22. Overall feeling fine. Now lifting 30-40 lb weights a couple times a week and has no SOB with this. No SOB walking up stairs. Denies palpitations, abnormal bleeding, CP, dizziness, edema, or PND/Orthopnea. Appetite ok. No fever or chills. Weight at home 220-221 pounds. Drinks 1 beer every 2-3 weeks, no tobacco use. Taking all medications, has not had Lasix > 1 year. He does not work.   Cardiac Studies: - Echo (4/21) EF 15% with severe global HK, and moderate RV dysfunction.  Etiology possible ETOH versus HTN (denies severe ETOH use). TSH ok. HIV NR   - LHC (4/21) w/ severe 1v CAD. RHC with preserved CO.  - CABG (5/21) x 1 w/ LIMA-LAD.   - Echo (6/21): EF  25-30%, RV mildly reduced.   - Echo (9/21): EF 25-30%   - ICD (12/21)  Past Medical History:  Diagnosis Date   CHF (congestive heart failure) (HCC)    Gout    No blood products 01/27/2020    Current Outpatient Medications  Medication Sig Dispense Refill   aspirin 81 MG chewable tablet Chew 1 tablet (81 mg total) by mouth daily.     atorvastatin (LIPITOR) 20 MG tablet Take 1 tablet (20 mg total) by mouth daily. 90 tablet 3   dapagliflozin propanediol (FARXIGA) 10 MG TABS tablet Take 1 tablet (10 mg total) by mouth daily. NEEDS FOLLOW UP APPOINTMENT FOR ANYMORE REFILLS 60 tablet 0   metoprolol succinate (TOPROL-XL) 25 MG 24 hr tablet Take 2 tablets (50 mg total) by mouth daily. NEEDS FOLLOW UP APPOINTMENT FOR ANYMORE REFILLS 90 tablet 0   sacubitril-valsartan (ENTRESTO) 97-103 MG Take 1 tablet by mouth 2 (two) times daily. 60 tablet 0   spironolactone (ALDACTONE) 25 MG tablet Take 25 mg by mouth daily.     No current facility-administered medications for this encounter.    Allergies  Allergen Reactions   Other Other (See Comments)    No blood products      Social History   Socioeconomic History   Marital status: Single    Spouse name: Not on file   Number of children: Not on file   Years  of education: Not on file   Highest education level: Not on file  Occupational History   Not on file  Tobacco Use   Smoking status: Never   Smokeless tobacco: Never  Substance and Sexual Activity   Alcohol use: Yes    Comment: has 1-2 drinks every 2-3 days   Drug use: Not on file   Sexual activity: Not on file  Other Topics Concern   Not on file  Social History Narrative   Not on file   Social Determinants of Health   Financial Resource Strain: Low Risk  (01/26/2020)   Overall Financial Resource Strain (CARDIA)    Difficulty of Paying Living Expenses: Not very hard  Food Insecurity: No Food Insecurity (01/26/2020)   Hunger Vital Sign    Worried About Running Out of Food in  the Last Year: Never true    Ran Out of Food in the Last Year: Never true  Transportation Needs: No Transportation Needs (01/26/2020)   PRAPARE - Administrator, Civil Service (Medical): No    Lack of Transportation (Non-Medical): No  Physical Activity: Not on file  Stress: Not on file  Social Connections: Not on file  Intimate Partner Violence: Not on file   Family History  Problem Relation Age of Onset   Gout Sister    BP 100/74   Pulse 90   Resp (!) 96   Wt 102.5 kg (226 lb)   BMI 30.65 kg/m   Wt Readings from Last 3 Encounters:  07/21/22 102.5 kg (226 lb)  03/12/22 98.4 kg (217 lb)  03/11/21 103.4 kg (228 lb)   PHYSICAL EXAM: General:  NAD. No resp difficulty HEENT: Normal Neck: Supple. No JVD. Carotids 2+ bilat; no bruits. No lymphadenopathy or thryomegaly appreciated. Cor: PMI nondisplaced. Regular rate & rhythm. No rubs, gallops or murmurs. Lungs: Clear Abdomen: Soft, nontender, nondistended. No hepatosplenomegaly. No bruits or masses. Good bowel sounds. Extremities: No cyanosis, clubbing, rash, edema Neuro: Alert & oriented x 3, cranial nerves grossly intact. Moves all 4 extremities w/o difficulty. Affect pleasant.  ICD interrogation: OptiVol down, thoracic impedence stable, No VT or AF, 4 hr/day activity (Personally reviewed).  ECG:  SR 89 bpm (personally reviewed).  ASSESSMENT & PLAN: 1. Chronic Systolic Heart Failure - Echo (4/21) EF 15% with severe global HK, and moderate RV dysfunction.   - Etiology possible ETOH versus HTN (denies severe ETOH use). TSH ok. HIV NR  - LHC (4/28) w/ severe 1v CAD. RHC with preserved CO. - s/p CABG (5/21) x 1 w/ LIMA-LAD.  - Echo (6/21): EF 25-30%, RV mildly reduced.  - Echo (9/21): EF 25-30%  - s/p ICD (12/21) - Stable NYHA I. Volume ok. He does not need daily loops - Continue Entresto 97-103 mg bid.  - Continue Farxiga 10 mg daily.  - Continue spiro 25 mg daily.  - Continue Toprol XL 50 mg daily. - Labs  today. Refill all HF meds. - Update echo  2. CAD - LHC (4/21) w/ severe 1V CAD. - S/p CABG x 1 (5/21) w/ LIMA-LAD. - No s/s angina. - Continue ASA, statin and beta blocker.  3. H/o Atrial Flutter  - s/p ablation 6/21 by [name]. Periods of junctional rhythm post ablation- off amio and dig -> improved.  - Now s/p ICD. - NSR on ECG today.  - No longer on Eliquis.  4. Sinus Tach  - Resolved. - Continue Toprol.  5. ETOH - He has cut back significantly. - Congratulated.  Follow up in 4 months with Dr. Haroldine Laws + echo.  [place], FNP-BC 07/21/22

## 2022-07-28 ENCOUNTER — Other Ambulatory Visit: Payer: Self-pay | Admitting: Internal Medicine

## 2022-08-11 ENCOUNTER — Other Ambulatory Visit: Payer: Self-pay | Admitting: Internal Medicine

## 2022-09-23 ENCOUNTER — Ambulatory Visit (INDEPENDENT_AMBULATORY_CARE_PROVIDER_SITE_OTHER): Payer: Medicare Other

## 2022-09-23 DIAGNOSIS — I255 Ischemic cardiomyopathy: Secondary | ICD-10-CM

## 2022-09-23 LAB — CUP PACEART REMOTE DEVICE CHECK
Battery Remaining Longevity: 124 mo
Battery Voltage: 3.03 V
Brady Statistic RV Percent Paced: 0.01 %
Date Time Interrogation Session: 20231227033424
HighPow Impedance: 65 Ohm
Implantable Lead Connection Status: 753985
Implantable Lead Implant Date: 20211228
Implantable Lead Location: 753860
Implantable Pulse Generator Implant Date: 20211228
Lead Channel Impedance Value: 380 Ohm
Lead Channel Impedance Value: 456 Ohm
Lead Channel Pacing Threshold Amplitude: 0.5 V
Lead Channel Pacing Threshold Pulse Width: 0.4 ms
Lead Channel Sensing Intrinsic Amplitude: 27.5 mV
Lead Channel Sensing Intrinsic Amplitude: 27.5 mV
Lead Channel Setting Pacing Amplitude: 2 V
Lead Channel Setting Pacing Pulse Width: 0.4 ms
Lead Channel Setting Sensing Sensitivity: 0.3 mV
Zone Setting Status: 755011
Zone Setting Status: 755011

## 2022-10-13 NOTE — Progress Notes (Signed)
Remote ICD transmission.   

## 2022-10-19 ENCOUNTER — Encounter: Payer: Self-pay | Admitting: Internal Medicine

## 2022-10-19 ENCOUNTER — Ambulatory Visit: Payer: Medicare Other | Attending: Internal Medicine | Admitting: Internal Medicine

## 2022-10-19 VITALS — BP 101/68 | HR 89 | Temp 97.7°F | Ht 72.0 in | Wt 226.0 lb

## 2022-10-19 DIAGNOSIS — Z7984 Long term (current) use of oral hypoglycemic drugs: Secondary | ICD-10-CM | POA: Insufficient documentation

## 2022-10-19 DIAGNOSIS — Z79899 Other long term (current) drug therapy: Secondary | ICD-10-CM | POA: Diagnosis not present

## 2022-10-19 DIAGNOSIS — Z125 Encounter for screening for malignant neoplasm of prostate: Secondary | ICD-10-CM

## 2022-10-19 DIAGNOSIS — I2510 Atherosclerotic heart disease of native coronary artery without angina pectoris: Secondary | ICD-10-CM | POA: Diagnosis present

## 2022-10-19 DIAGNOSIS — I110 Hypertensive heart disease with heart failure: Secondary | ICD-10-CM | POA: Insufficient documentation

## 2022-10-19 DIAGNOSIS — Z23 Encounter for immunization: Secondary | ICD-10-CM | POA: Diagnosis not present

## 2022-10-19 DIAGNOSIS — I5022 Chronic systolic (congestive) heart failure: Secondary | ICD-10-CM | POA: Diagnosis not present

## 2022-10-19 DIAGNOSIS — Z1211 Encounter for screening for malignant neoplasm of colon: Secondary | ICD-10-CM

## 2022-10-19 DIAGNOSIS — I484 Atypical atrial flutter: Secondary | ICD-10-CM

## 2022-10-19 NOTE — Progress Notes (Signed)
Patient ID: Corey Brady, male    DOB: 1955/10/19  MRN: [id number]  CC: Follow-up (Follow up. /No questions / concerns. Dallie Piles received flu vax this season.)   Subjective: Corey Brady is a 67 y.o. male who presents for chronic ds managment His concerns today include:  CAD s/p CABG 12/2019, chronic systolic CHF, HL, hx of atrial flutter/fib (s/p ablation 02/2020.  Experienced junctional rhythm post ablation.   venous stasis LE BL,     HYPERTENSION/CAD/systolic CHF Currently taking: see medication list.  He is on Entresto 97/103 mg twice a day, Farxiga 10 mg daily, spironolactone 25 mg daily, Toprol 50 mg daily.  Last seen by cardiology 07/21/2022. Med Adherence: [x]  Yes    []  No Medication side effects: []  Yes    [x]  No Adherence with salt restriction: [x]  Yes    []  No SOB? []  Yes    [x]  No.  No PND orthopnea. Chest Pain?: []  Yes    [x]  No Leg swelling?: []  Yes    [x]  No Headaches?: []  Yes    [x]  No Dizziness? []  Yes    [x]  No Comments:  since last visit he had ICD placed.  No firing since placed  BMI 30.  Rides stationary bike 2-3 x a wk for 3-5 mins.  Uses some wghs as well.  Feels he could do better with eating habits  ETOH use:  drinks a beer once every 2-3 wks.  Reports he drank a little heavier in past.    HM:  Had flu shot 06/2022.  Due for PCV 20, Tdapt and Shingrix.  Due for colon CA screen. Brother with hx of colon polyps.  Due for MWV;  received Medicare over 1 yr ago.  Agreeable to PSA for prostate CA screen.  Patient Active Problem List   Diagnosis Date Noted   Venous stasis of both lower extremities 05/24/2020   History of atrial flutter 05/24/2020   Acute on chronic systolic (congestive) heart failure (HCC) 03/12/2020   S/P CABG (coronary artery bypass graft) 01/30/2020     Current Outpatient Medications on File Prior to Visit  Medication Sig Dispense Refill   aspirin 81 MG chewable tablet Chew 1 tablet (81 mg total) by mouth daily.     atorvastatin (LIPITOR) 20  MG tablet Take 1 tablet (20 mg total) by mouth daily. 90 tablet 3   dapagliflozin propanediol (FARXIGA) 10 MG TABS tablet TAKE 1 TABLET(10 MG) BY MOUTH DAILY. NEED FOLLOW UP APPOINTMENT FOR ANYMORE REFILLS 60 tablet 6   metoprolol succinate (TOPROL-XL) 50 MG 24 hr tablet Take 1 tablet (50 mg total) by mouth daily. 30 tablet 3   sacubitril-valsartan (ENTRESTO) 97-103 MG Take 1 tablet by mouth 2 (two) times daily. 60 tablet 3   spironolactone (ALDACTONE) 25 MG tablet Take 1 tablet (25 mg total) by mouth daily. 30 tablet 3   No current facility-administered medications on file prior to visit.    Allergies  Allergen Reactions   Other Other (See Comments)    No blood products    Social History   Socioeconomic History   Marital status: Single    Spouse name: Not on file   Number of children: Not on file   Years of education: Not on file   Highest education level: Not on file  Occupational History   Not on file  Tobacco Use   Smoking status: Never   Smokeless tobacco: Never  Substance and Sexual Activity   Alcohol use: Yes  Comment: has 1-2 drinks every 2-3 days   Drug use: Not on file   Sexual activity: Not on file  Other Topics Concern   Not on file  Social History Narrative   Not on file   Social Determinants of Health   Financial Resource Strain: Low Risk  (01/26/2020)   Overall Financial Resource Strain (CARDIA)    Difficulty of Paying Living Expenses: Not very hard  Food Insecurity: No Food Insecurity (01/26/2020)   Hunger Vital Sign    Worried About Running Out of Food in the Last Year: Never true    Ran Out of Food in the Last Year: Never true  Transportation Needs: No Transportation Needs (01/26/2020)   PRAPARE - Hydrologist (Medical): No    Lack of Transportation (Non-Medical): No  Physical Activity: Not on file  Stress: Not on file  Social Connections: Not on file  Intimate Partner Violence: Not on file    Family History   Problem Relation Age of Onset   Gout Sister     Past Surgical History:  Procedure Laterality Date   A-FLUTTER ABLATION N/A 03/15/2020   Procedure: A-FLUTTER ABLATION;  Surgeon: Constance Haw, MD;  Location: [place] [facility];  Service: Cardiovascular;  Laterality: N/A;   CARDIOVERSION N/A 02/06/2020   Procedure: CARDIOVERSION;  Surgeon: Jolaine Artist, MD;  Location: [place];  Service: Cardiovascular;  Laterality: N/A;   CORONARY ARTERY BYPASS GRAFT N/A 01/30/2020   Procedure: OFF PUMP CORONARY ARTERY BYPASS GRAFTING (CABG) TIMES ONE;  Surgeon: Lajuana Matte, MD;  Location: [place];  Service: Open Heart Surgery;  Laterality: N/A;  swan only   ICD IMPLANT N/A 09/24/2020   Procedure: ICD IMPLANT;  Surgeon: Constance Haw, MD;  Location: [place] [facility];  Service: Cardiovascular;  Laterality: N/A;   RIGHT/LEFT HEART CATH AND CORONARY ANGIOGRAPHY N/A 01/26/2020   Procedure: RIGHT/LEFT HEART CATH AND CORONARY ANGIOGRAPHY;  Surgeon: Jolaine Artist, MD;  Location: [place] [facility];  Service: Cardiovascular;  Laterality: N/A;   TEE WITHOUT CARDIOVERSION N/A 01/30/2020   Procedure: TRANSESOPHAGEAL ECHOCARDIOGRAM (TEE);  Surgeon: Lajuana Matte, MD;  Location: [place];  Service: Open Heart Surgery;  Laterality: N/A;   TEE WITHOUT CARDIOVERSION N/A 02/06/2020   Procedure: TRANSESOPHAGEAL ECHOCARDIOGRAM (TEE);  Surgeon: Jolaine Artist, MD;  Location: [facility];  Service: Cardiovascular;  Laterality: N/A;    ROS: Review of Systems Negative except as stated above  PHYSICAL EXAM: BP 101/68 (BP Location: Left Arm, Patient Position: Sitting, Cuff Size: Normal)   Pulse 89   Temp 97.7 F (36.5 C) (Oral)   Ht 6' (1.829 m)   Wt 226 lb (102.5 kg)   SpO2 96%   BMI 30.65 kg/m   Wt Readings from Last 3 Encounters:  10/19/22 226 lb (102.5 kg)  07/21/22 226 lb (102.5 kg)  03/12/22 217 lb (98.4 kg)    Physical Exam   General appearance - alert, well  appearing, older caucasian male and in no distress Mental status - normal mood, behavior, speech, dress, motor activity, and thought processes Neck - supple, no significant adenopathy Chest - clear to auscultation, no wheezes, rales or rhonchi, symmetric air entry Heart - normal rate, regular rhythm, normal S1, S2, no murmurs, rubs, clicks or gallops Extremities - peripheral pulses normal, no pedal edema, no clubbing or cyanosis     Latest Ref Rng & Units 07/21/2022   12:37 PM 03/11/2021   11:43 AM 03/06/2021  2:22 PM  CMP  Glucose 70 - 99 mg/dL 103  107  131   BUN 8 - 23 mg/dL 11  15  17    Creatinine 0.61 - 1.24 mg/dL 1.08  1.06  1.19   Sodium 135 - 145 mmol/L 137  137  139   Potassium 3.5 - 5.1 mmol/L 4.5  4.1  4.2   Chloride 98 - 111 mmol/L 106  106  106   CO2 22 - 32 mmol/L 24  24  23    Calcium 8.9 - 10.3 mg/dL 9.3  9.2  8.9    Lipid Panel     Component Value Date/Time   CHOL 128 02/12/2021 1046   TRIG 139 02/12/2021 1046   HDL 41 02/12/2021 1046   CHOLHDL 3.1 02/12/2021 1046   VLDL 28 02/12/2021 1046   LDLCALC 59 02/12/2021 1046    CBC    Component Value Date/Time   WBC 7.0 03/06/2021 1422   RBC 5.16 03/06/2021 1422   HGB 16.2 03/06/2021 1422   HGB 13.1 02/16/2020 1615   HCT 48.5 03/06/2021 1422   HCT 38.7 02/16/2020 1615   PLT 199 03/06/2021 1422   PLT 349 02/16/2020 1615   MCV 94.0 03/06/2021 1422   MCV 89 02/16/2020 1615   MCH 31.4 03/06/2021 1422   MCHC 33.4 03/06/2021 1422   RDW 12.8 03/06/2021 1422   RDW 14.3 02/16/2020 1615   LYMPHSABS 1.7 03/06/2021 1422   MONOABS 0.6 03/06/2021 1422   EOSABS 0.1 03/06/2021 1422   BASOSABS 0.1 03/06/2021 1422    ASSESSMENT AND PLAN:  1. Coronary artery disease involving native coronary artery of native heart without angina pectoris Stable.  Continue atorvastatin and metoprolol - CBC - Hepatic function panel - Lipid panel  2. Chronic systolic congestive heart failure (HCC) Stable and compensated.  Continue  spironolactone, Entresto, Farxiga and metoprolol  3. Atypical atrial flutter (HCC) Stable. Patient has had ICD placed since last visit with me  4. Screening for colon cancer Discussed colon cancer screening methods.  Patient prefers to do Cologuard test.  5. Need for vaccination against Streptococcus pneumoniae  - Pneumococcal conjugate vaccine 20-valent  6. Need for Tdap vaccination Given today.  7. Prostate cancer screening Discussed prostate cancer screening with PSA level.  Advance Beneficiary Notice of No Coverage did not fire and ordering this test for screening.  I went over the form with the patient and he chose option 1 to have the test done and submitted to Medicare.  If it is not covered, he agrees and understands that he will be billed about $65 for this test.  However I was unable to print the form to have him sign it but patient states that he would still like to have the test done and agrees to pay the estimated cost of $65 should it not be covered by Medicare. - PSA    Patient was given the opportunity to ask questions.  Patient verbalized understanding of the plan and was able to repeat key elements of the plan.   This documentation was completed using Radio producer.  Any transcriptional errors are unintentional.  No orders of the defined types were placed in this encounter.    Requested Prescriptions    No prescriptions requested or ordered in this encounter    No follow-ups on file.  Karle Plumber, MD, FACP

## 2022-10-20 LAB — LIPID PANEL
Chol/HDL Ratio: 2.6 ratio (ref 0.0–5.0)
Cholesterol, Total: 119 mg/dL (ref 100–199)
HDL: 45 mg/dL (ref >39)
LDL Chol Calc (NIH): 57 mg/dL (ref 0–99)
Triglycerides: 86 mg/dL (ref 0–149)
VLDL Cholesterol Cal: 17 mg/dL (ref 5–40)

## 2022-10-20 LAB — HEPATIC FUNCTION PANEL
ALT: 37 IU/L (ref 0–44)
AST: 28 IU/L (ref 0–40)
Albumin: 4.4 g/dL (ref 3.9–4.9)
Alkaline Phosphatase: 83 IU/L (ref 44–121)
Bilirubin Total: 0.6 mg/dL (ref 0.0–1.2)
Bilirubin, Direct: 0.17 mg/dL (ref 0.00–0.40)
Total Protein: 7 g/dL (ref 6.0–8.5)

## 2022-10-20 LAB — CBC
Hematocrit: 47.1 % (ref 37.5–51.0)
Hemoglobin: 16.2 g/dL (ref 13.0–17.7)
MCH: 30.7 pg (ref 26.6–33.0)
MCHC: 34.4 g/dL (ref 31.5–35.7)
MCV: 89 fL (ref 79–97)
Platelets: 202 10*3/uL (ref 150–450)
RBC: 5.27 x10E6/uL (ref 4.14–5.80)
RDW: 12.7 % (ref 11.6–15.4)
WBC: 6.4 10*3/uL (ref 3.4–10.8)

## 2022-10-20 LAB — PSA: Prostate Specific Ag, Serum: 2.4 ng/mL (ref 0.0–4.0)

## 2022-10-28 ENCOUNTER — Telehealth: Payer: Self-pay | Admitting: Internal Medicine

## 2022-10-28 NOTE — Telephone Encounter (Signed)
Pt. Given lab results, verbalizes understanding. 

## 2022-12-01 ENCOUNTER — Other Ambulatory Visit (HOSPITAL_COMMUNITY): Payer: Self-pay | Admitting: Family Medicine

## 2022-12-01 ENCOUNTER — Encounter: Payer: Self-pay | Admitting: Internal Medicine

## 2022-12-01 ENCOUNTER — Ambulatory Visit: Payer: Medicare Other | Attending: Internal Medicine | Admitting: Internal Medicine

## 2022-12-01 VITALS — BP 109/78 | HR 78 | Temp 98.2°F | Ht 72.0 in | Wt 224.0 lb

## 2022-12-01 DIAGNOSIS — Z23 Encounter for immunization: Secondary | ICD-10-CM | POA: Diagnosis not present

## 2022-12-01 DIAGNOSIS — Z1159 Encounter for screening for other viral diseases: Secondary | ICD-10-CM

## 2022-12-01 DIAGNOSIS — Z0000 Encounter for general adult medical examination without abnormal findings: Secondary | ICD-10-CM | POA: Diagnosis not present

## 2022-12-01 DIAGNOSIS — Z1211 Encounter for screening for malignant neoplasm of colon: Secondary | ICD-10-CM

## 2022-12-01 DIAGNOSIS — Z7189 Other specified counseling: Secondary | ICD-10-CM

## 2022-12-01 MED ORDER — ZOSTER VAC RECOMB ADJUVANTED 50 MCG/0.5ML IM SUSR: 0.5000 mL | Freq: Once | INTRAMUSCULAR | 0 refills | Status: AC

## 2022-12-01 MED ORDER — ENTRESTO 97-103 MG PO TABS: 1.0000 | ORAL_TABLET | Freq: Two times a day (BID) | ORAL | 6 refills | Status: DC

## 2022-12-01 NOTE — Progress Notes (Signed)
Subjective:   Corey Brady is a 67 y.o. male who presents for an Initial Medicare Annual Wellness Visit. CAD s/p CABG 99991111, chronic systolic CHF, HL, hx of atrial flutter/fib (s/p ablation 02/2020.  Experienced junctional rhythm post ablation,. ICD 08/2020  venous stasis LE BL,     Review of Systems    CVS:  needs RF on Entresto.       Objective:    Today's Vitals   12/01/22 1441  BP: 109/78  Pulse: 78  Temp: 98.2 F (36.8 C)  TempSrc: Oral  SpO2: 95%  Weight: 224 lb (101.6 kg)  Height: 6' (1.829 m)   Body mass index is 30.38 kg/m.     12/01/2022    2:43 PM 03/06/2021    4:20 PM 09/24/2020   11:53 AM 03/12/2020    1:40 PM 02/23/2020    7:21 AM 01/23/2020    8:16 PM  Advanced Directives  Does Patient Have a Medical Advance Directive? No No No No No No  Would patient like information on creating a medical advance directive? No - Patient declined No - Patient declined No - Patient declined No - Patient declined No - Patient declined No - Patient declined  Patient did not know what an advanced directive is.  After explaining it to him, he wanted to learn more about it.  Discussed with him what is a living will and healthcare power of attorney.  Given packet to take home to review the information.  Forms are included should he decide to execute a living will or healthcare power of attorney.  Current Medications (verified) Outpatient Encounter Medications as of 12/01/2022  Medication Sig   aspirin 81 MG chewable tablet Chew 1 tablet (81 mg total) by mouth daily.   atorvastatin (LIPITOR) 20 MG tablet Take 1 tablet (20 mg total) by mouth daily.   dapagliflozin propanediol (FARXIGA) 10 MG TABS tablet TAKE 1 TABLET(10 MG) BY MOUTH DAILY. NEED FOLLOW UP APPOINTMENT FOR ANYMORE REFILLS   metoprolol succinate (TOPROL-XL) 50 MG 24 hr tablet Take 1 tablet (50 mg total) by mouth daily.   sacubitril-valsartan (ENTRESTO) 97-103 MG Take 1 tablet by mouth 2 (two) times daily.   spironolactone  (ALDACTONE) 25 MG tablet Take 1 tablet (25 mg total) by mouth daily.   No facility-administered encounter medications on file as of 12/01/2022.    Allergies (verified) Other   History: Past Medical History:  Diagnosis Date   CHF (congestive heart failure) ([place])    Gout    No blood products 01/27/2020   Past Surgical History:  Procedure Laterality Date   A-FLUTTER ABLATION N/A 03/15/2020   Procedure: A-FLUTTER ABLATION;  Surgeon: Constance Haw, MD;  Location: [place] [facility];  Service: Cardiovascular;  Laterality: N/A;   CARDIOVERSION N/A 02/06/2020   Procedure: CARDIOVERSION;  Surgeon: Jolaine Artist, MD;  Location: [place];  Service: Cardiovascular;  Laterality: N/A;   CORONARY ARTERY BYPASS GRAFT N/A 01/30/2020   Procedure: OFF PUMP CORONARY ARTERY BYPASS GRAFTING (CABG) TIMES ONE;  Surgeon: Lajuana Matte, MD;  Location: [place];  Service: Open Heart Surgery;  Laterality: N/A;  swan only   ICD IMPLANT N/A 09/24/2020   Procedure: ICD IMPLANT;  Surgeon: Constance Haw, MD;  Location: [place] [facility];  Service: Cardiovascular;  Laterality: N/A;   RIGHT/LEFT HEART CATH AND CORONARY ANGIOGRAPHY N/A 01/26/2020   Procedure: RIGHT/LEFT HEART CATH AND CORONARY ANGIOGRAPHY;  Surgeon: Jolaine Artist, MD;  Location: [place];  Service: Cardiovascular;  Laterality: N/A;   TEE WITHOUT CARDIOVERSION N/A 01/30/2020   Procedure: TRANSESOPHAGEAL ECHOCARDIOGRAM (TEE);  Surgeon: Lajuana Matte, MD;  Location: [place];  Service: Open Heart Surgery;  Laterality: N/A;   TEE WITHOUT CARDIOVERSION N/A 02/06/2020   Procedure: TRANSESOPHAGEAL ECHOCARDIOGRAM (TEE);  Surgeon: Jolaine Artist, MD;  Location: [facility];  Service: Cardiovascular;  Laterality: N/A;   Family History  Problem Relation Age of Onset   Gout Sister    Social History   Socioeconomic History   Marital status: Single    Spouse name: Not on file   Number of children: Not on file    Years of education: Not on file   Highest education level: Not on file  Occupational History   Not on file  Tobacco Use   Smoking status: Never   Smokeless tobacco: Never  Substance and Sexual Activity   Alcohol use: Yes    Comment: has 1-2 drinks every 2-3 days   Drug use: Not on file   Sexual activity: Not on file  Other Topics Concern   Not on file  Social History Narrative   Not on file   Social Determinants of Health   Financial Resource Strain: Low Risk  (01/26/2020)   Overall Financial Resource Strain (CARDIA)    Difficulty of Paying Living Expenses: Not very hard  Food Insecurity: No Food Insecurity (12/01/2022)   Hunger Vital Sign    Worried About Running Out of Food in the Last Year: Never true    Ran Out of Food in the Last Year: Never true  Transportation Needs: No Transportation Needs (01/26/2020)   PRAPARE - Hydrologist (Medical): No    Lack of Transportation (Non-Medical): No  Physical Activity: Sufficiently Active (12/01/2022)   Exercise Vital Sign    Days of Exercise per Week: 3 days    Minutes of Exercise per Session: 60 min  Stress: No Stress Concern Present (12/01/2022)   [place]    Feeling of Stress : Not at all  Social Connections: Moderately Integrated (12/01/2022)   Social Connection and Isolation Panel [NHANES]    Frequency of Communication with Friends and Family: Twice a week    Frequency of Social Gatherings with Friends and Family: Three times a week    Attends Religious Services: 1 to 4 times per year    Active Member of Clubs or Organizations: Yes    Attends Archivist Meetings: 1 to 4 times per year    Marital Status: Never married    Tobacco Counseling Pt does not smoke. Drinks 1-2 beers Q 2-3 wks  Clinical Intake:  Pain : No/denies pain  Diabetes: No Diabetic?no  Interpreter Needed?: No  Activities of Daily Living    12/01/2022     2:33 PM  In your present state of health, do you have any difficulty performing the following activities:  Hearing? 0  Vision? 0  Difficulty concentrating or making decisions? 0  Walking or climbing stairs? 0  Dressing or bathing? 0  Doing errands, shopping? 0  Preparing Food and eating ? N  Using the Toilet? N  In the past six months, have you accidently leaked urine? N  Do you have problems with loss of bowel control? N  Managing your Medications? N  Managing your Finances? N  Housekeeping or managing your Housekeeping? N    Patient Care Team: Ladell Pier, MD as PCP -  General (Internal Medicine) Bensimhon, Shaune Pascal, MD as PCP - Advanced Heart Failure (Cardiology) Constance Haw, MD as PCP - Electrophysiology (Cardiology)  Indicate any recent Medical Services you may have received from other than [facility] providers in the past year (date may be approximate).     Assessment:   This is a routine wellness examination for [place].  Hearing/Vision screen Whisper test is nl  Dietary issues and exercise activities discussed:   Rides stationary bike 2-3 x a wk for 3-5 mins.  Uses some wghs as well.  Doing a little better with his eating habits.   Goals Addressed   None   Depression Screen    12/01/2022    2:42 PM 10/19/2022    1:55 PM 05/24/2020   10:50 AM 04/04/2020    3:38 PM 03/29/2020   10:07 AM 02/16/2020    3:34 PM  PHQ 2/9 Scores  PHQ - 2 Score 0 0 0 0 0 0  PHQ- 9 Score 0 0 0 0 0 0    Fall Risk    10/19/2022    1:51 PM 05/24/2020   10:50 AM 02/16/2020    3:35 PM 02/16/2020    3:12 PM  [place] in the past year? 0 0 0 0  Number falls in past yr: 0 0 1 0  Injury with Fall? 0 0 1 0  Risk for fall due to : No Fall Risks     Follow up   Falls evaluation completed Falls evaluation completed    FALL RISK PREVENTION PERTAINING TO THE HOME:  Any stairs in or around the home? Yes  If so, are there any without handrails?  3 steps on back porch without  handrails Home free of loose throw rugs in walkways, pet beds, electrical cords, etc? No  Adequate lighting in your home to reduce risk of falls? Yes   ASSISTIVE DEVICES UTILIZED TO PREVENT FALLS:  Life alert? No  Use of a cane, walker or w/c? No  Grab bars in the bathroom? No  Shower chair or bench in shower? No  Elevated toilet seat or a handicapped toilet? No   TIMED UP AND GO:  Was the test performed? Yes .  Length of time to ambulate 10 feet: 8 sec.   Gait slow and steady without use of assistive device  Cognitive Function:    12/01/2022    2:44 PM  MMSE - Mini Mental State Exam  Orientation to time 5  Orientation to Place 5  Registration 3  Attention/ Calculation 5  Recall 3  Language- name 2 objects 2  Language- repeat 1  Language- follow 3 step command 3  Language- read & follow direction 1  Write a sentence 1  Copy design 1  Total score 30        Immunizations Immunization History  Administered Date(s) Administered   Influenza,inj,Quad PF,6+ Mos 05/24/2020   Influenza-Unspecified 06/28/2022   PFIZER(Purple Top)SARS-COV-2 Vaccination 07/13/2020, 08/10/2020   PNEUMOCOCCAL CONJUGATE-20 10/19/2022   Tdap 10/19/2022    TDAP status: Up to date  Flu Vaccine status: Up to date  Pneumococcal vaccine status: Up to date  Covid-19 vaccine status: Information provided on how to obtain vaccines.   Qualifies for Shingles Vaccine? Yes   Zostavax completed No   Shingrix Completed?: No.    Education has been provided regarding the importance of this vaccine. Patient has been advised to call insurance company to determine out of pocket expense if they have not  yet received this vaccine. Advised may also receive vaccine at local pharmacy or Health Dept. Verbalized acceptance and understanding.  Screening Tests Health Maintenance  Topic Date Due   Hepatitis C Screening  Never done   COLONOSCOPY (Pts 45-39yr Insurance coverage will need to be confirmed)  Never done    Zoster Vaccines- Shingrix (1 of 2) Never done   COVID-19 Vaccine (3 - 2023-24 season) 05/29/2022   Medicare Annual Wellness (AWV)  12/01/2023   DTaP/Tdap/Td (2 - Td or Tdap) 10/19/2032   Pneumonia Vaccine 67 Years old  Completed   INFLUENZA VACCINE  Completed   HPV VACCINES  Aged Out    Health Maintenance  Health Maintenance Due  Topic Date Due   Hepatitis C Screening  Never done   COLONOSCOPY (Pts 45-465yrInsurance coverage will need to be confirmed)  Never done   Zoster Vaccines- Shingrix (1 of 2) Never done   COVID-19 Vaccine (3 - 2023-24 season) 05/29/2022    Colon CA screen:  prefers cologuard  Lung Cancer Screening: (Low Dose CT Chest recommended if Age 67-80ears, 30 pack-year currently smoking OR have quit w/in 15years.) does not qualify.   Lung Cancer Screening Referral: NA  Additional Screening:  Hepatitis C Screening: does qualify; Completed 12/01/2022  Vision Screening: Recommended annual ophthalmology exams for early detection of glaucoma and other disorders of the eye. Is the patient up to date with their annual eye exam?  No  Who is the provider or what is the name of the office in which the patient attends annual eye exams? NA If pt is not established with a provider, would they like to be referred to a provider to establish care?  Pt will call and schedule himself .   Dental Screening: Recommended annual dental exams for proper oral hygiene  Community Resource Referral / Chronic Care Management: CRR required this visit?  No   CCM required this visit?  No      Plan:    1. Encounter for Medicare annual wellness exam   2. Advance directive discussed with patient Advised patient that if he execute a living will or healthcare power of attorney, please bring a copy for usKoreao put in his chart.  3. Need for shingles vaccine Patient agreeable to receiving Shingrix vaccine series.  Informed him that the vaccine can cause some soreness and redness at the  injection site.  Printed prescription given to him to take to his pharmacy - Zoster Vaccine Adjuvanted ([facility]; Inject 0.5 mLs into the muscle once for 1 dose.  Dispense: 0.5 mL; Refill: 0  4. Screening for colon cancer Discussed colon cancer screening recommendations.  He prefers to have Cologuard test.  I thought I had ordered this on last visit.  Order placed today - Cologuard  5. Need for hepatitis C screening test - Hepatitis C Antibody  I have personally reviewed and noted the following in the patient's chart:   Medical and social history Use of alcohol, tobacco or illicit drugs  Current medications and supplements including opioid prescriptions. Patient is not currently taking opioid prescriptions. Functional ability and status Nutritional status Physical activity Advanced directives List of other physicians Hospitalizations, surgeries, and ER visits in previous 12 months Vitals Screenings to include cognitive, depression, and falls Referrals and appointments  In addition, I have reviewed and discussed with patient certain preventive protocols, quality metrics, and best practice recommendations. A written personalized care plan for preventive services as well as general preventive health recommendations were  provided to patient.     Karle Plumber, MD   12/01/2022

## 2022-12-01 NOTE — Patient Instructions (Addendum)
Please review the packet that I gave you on advanced directive.  If you execute a living will or healthcare power of attorney, please bring a copy for our records.  You will receive a Cologuard kit in the mail with instructions of how to collect your stool sample and send it back for colon cancer screening.  Please take the prescription for the shingles vaccine to your pharmacy to be given the first shot.

## 2022-12-02 LAB — HEPATITIS C ANTIBODY: Hep C Virus Ab: NONREACTIVE

## 2022-12-23 ENCOUNTER — Ambulatory Visit (INDEPENDENT_AMBULATORY_CARE_PROVIDER_SITE_OTHER): Payer: Medicare Other

## 2022-12-23 DIAGNOSIS — I255 Ischemic cardiomyopathy: Secondary | ICD-10-CM

## 2022-12-23 LAB — CUP PACEART REMOTE DEVICE CHECK
Battery Remaining Longevity: 123 mo
Battery Voltage: 3.02 V
Brady Statistic RV Percent Paced: 0.01 %
Date Time Interrogation Session: 20240327042204
HighPow Impedance: 65 Ohm
Implantable Lead Connection Status: 753985
Implantable Lead Implant Date: 20211228
Implantable Lead Location: 753860
Implantable Pulse Generator Implant Date: 20211228
Lead Channel Impedance Value: 437 Ohm
Lead Channel Impedance Value: 494 Ohm
Lead Channel Pacing Threshold Amplitude: 0.5 V
Lead Channel Pacing Threshold Pulse Width: 0.4 ms
Lead Channel Sensing Intrinsic Amplitude: 24.75 mV
Lead Channel Sensing Intrinsic Amplitude: 24.75 mV
Lead Channel Setting Pacing Amplitude: 2 V
Lead Channel Setting Pacing Pulse Width: 0.4 ms
Lead Channel Setting Sensing Sensitivity: 0.3 mV
Zone Setting Status: 755011
Zone Setting Status: 755011

## 2023-01-01 LAB — COLOGUARD: COLOGUARD: NEGATIVE

## 2023-01-11 ENCOUNTER — Telehealth: Payer: Self-pay | Admitting: *Deleted

## 2023-01-11 NOTE — Telephone Encounter (Signed)
Pt given lab results per notes of Dr. Laural Benes from 01/01/23 on 01/11/23. Pt verbalized understanding normal cologuard test. Will be due again in 3 years.

## 2023-02-01 NOTE — Progress Notes (Signed)
Remote ICD transmission.   

## 2023-02-21 ENCOUNTER — Other Ambulatory Visit (HOSPITAL_COMMUNITY): Payer: Self-pay | Admitting: Family Medicine

## 2023-03-24 ENCOUNTER — Ambulatory Visit (INDEPENDENT_AMBULATORY_CARE_PROVIDER_SITE_OTHER): Payer: Medicare Other

## 2023-03-24 DIAGNOSIS — I255 Ischemic cardiomyopathy: Secondary | ICD-10-CM

## 2023-03-24 LAB — CUP PACEART REMOTE DEVICE CHECK
Battery Remaining Longevity: 121 mo
Battery Voltage: 3.02 V
Brady Statistic RV Percent Paced: 0.02 %
Date Time Interrogation Session: 20240626043723
HighPow Impedance: 68 Ohm
Implantable Lead Connection Status: 753985
Implantable Lead Implant Date: 20211228
Implantable Lead Location: 753860
Implantable Pulse Generator Implant Date: 20211228
Lead Channel Impedance Value: 380 Ohm
Lead Channel Impedance Value: 456 Ohm
Lead Channel Pacing Threshold Amplitude: 0.5 V
Lead Channel Pacing Threshold Pulse Width: 0.4 ms
Lead Channel Sensing Intrinsic Amplitude: 21.75 mV
Lead Channel Sensing Intrinsic Amplitude: 21.75 mV
Lead Channel Setting Pacing Amplitude: 2 V
Lead Channel Setting Pacing Pulse Width: 0.4 ms
Lead Channel Setting Sensing Sensitivity: 0.3 mV
Zone Setting Status: 755011
Zone Setting Status: 755011

## 2023-04-14 NOTE — Progress Notes (Signed)
 Remote ICD transmission.   

## 2023-05-21 ENCOUNTER — Other Ambulatory Visit (HOSPITAL_COMMUNITY): Payer: Self-pay | Admitting: Family Medicine

## 2023-06-02 ENCOUNTER — Telehealth: Payer: Self-pay | Admitting: Internal Medicine

## 2023-06-02 NOTE — Telephone Encounter (Signed)
Copied from CRM [id number]. Topic: General - Other >> Jun 02, 2023  1:20 PM Santiya F wrote: Reason for CRM: Pt is calling in requesting to speak with Dr. Laural Benes at her earliest convenience regarding his medications. Please follow up with pt.

## 2023-06-02 NOTE — Telephone Encounter (Signed)
Called but no answer. LVM to call back to further clarify.

## 2023-06-03 NOTE — Telephone Encounter (Signed)
Called but no answer. LVM to call back to further clarify.

## 2023-06-04 NOTE — Telephone Encounter (Signed)
Noted! Thank you

## 2023-06-04 NOTE — Telephone Encounter (Addendum)
Pt states he got a call that he cannot have 2 primary care providers from the other dr he was seeing.  He was going to [facility]. He has decided to stay w/ Dr Laural Benes. Pt states he is good with all his meds until he sees Dr Laural Benes on 07/08/23.  I don't think pt remembered calling in and leaving that message.  He kept saying he wanted to find out what we wanted and why we were calling. But pt states he is all good and will see you 10/10. Nothing further needed.

## 2023-06-04 NOTE — Telephone Encounter (Signed)
Called but no answer. LVM to call back.  

## 2023-06-09 ENCOUNTER — Other Ambulatory Visit (HOSPITAL_COMMUNITY): Payer: Self-pay

## 2023-06-21 ENCOUNTER — Other Ambulatory Visit: Payer: Self-pay | Admitting: Internal Medicine

## 2023-06-22 NOTE — Telephone Encounter (Signed)
Requested medication (s) are due for refill today: Yes  Requested medication (s) are on the active medication list: Yes  Last refill:  12/01/22  Future visit scheduled: Yes  Notes to clinic:  Unable to refill per protocol, medication not assigned to the refill protocol.      Requested Prescriptions  Pending Prescriptions Disp Refills   ENTRESTO 97-103 MG [Pharmacy Med Name: ENTRESTO 97-103MG  TABLETS] 60 tablet 6    Sig: TAKE 1 TABLET BY MOUTH TWICE DAILY     Off-Protocol Failed - 06/21/2023  4:35 PM      Failed - Medication not assigned to a protocol, review manually.      Passed - Valid encounter within last 12 months    Recent Outpatient Visits           6 months ago Encounter for [employer] annual wellness exam   [place] [facility] Jonah Blue B, MD   8 months ago Coronary artery disease involving native coronary artery of native heart without angina pectoris   [place] [facility] Marcine Matar, MD   3 years ago Need for influenza vaccination   [facility], Cornelius Moras, RPH-CPP   3 years ago Venous stasis of both lower extremities   [place] [facility] Marcine Matar, MD   3 years ago Erythema of lower extremity   [place] [facility] Marcine Matar, MD       Future Appointments             In 2 weeks Marcine Matar, MD [facility]

## 2023-06-23 ENCOUNTER — Ambulatory Visit (INDEPENDENT_AMBULATORY_CARE_PROVIDER_SITE_OTHER): Payer: Medicare Other

## 2023-06-23 DIAGNOSIS — I5022 Chronic systolic (congestive) heart failure: Secondary | ICD-10-CM

## 2023-06-23 DIAGNOSIS — I255 Ischemic cardiomyopathy: Secondary | ICD-10-CM

## 2023-06-23 LAB — CUP PACEART REMOTE DEVICE CHECK
Battery Remaining Longevity: 119 mo
Battery Voltage: 3.02 V
Brady Statistic RV Percent Paced: 0.01 %
Date Time Interrogation Session: 20240925001704
HighPow Impedance: 72 Ohm
Implantable Lead Connection Status: 753985
Implantable Lead Implant Date: 20211228
Implantable Lead Location: 753860
Implantable Pulse Generator Implant Date: 20211228
Lead Channel Impedance Value: 456 Ohm
Lead Channel Impedance Value: 513 Ohm
Lead Channel Pacing Threshold Amplitude: 0.5 V
Lead Channel Pacing Threshold Pulse Width: 0.4 ms
Lead Channel Sensing Intrinsic Amplitude: 21.75 mV
Lead Channel Sensing Intrinsic Amplitude: 21.75 mV
Lead Channel Setting Pacing Amplitude: 2 V
Lead Channel Setting Pacing Pulse Width: 0.4 ms
Lead Channel Setting Sensing Sensitivity: 0.3 mV
Zone Setting Status: 755011
Zone Setting Status: 755011

## 2023-07-08 ENCOUNTER — Encounter: Payer: Self-pay | Admitting: Internal Medicine

## 2023-07-08 ENCOUNTER — Ambulatory Visit: Payer: Medicare Other | Attending: Internal Medicine | Admitting: Internal Medicine

## 2023-07-08 VITALS — BP 101/58 | HR 86 | Temp 98.2°F | Ht 72.0 in | Wt 229.0 lb

## 2023-07-08 DIAGNOSIS — I2510 Atherosclerotic heart disease of native coronary artery without angina pectoris: Secondary | ICD-10-CM | POA: Insufficient documentation

## 2023-07-08 DIAGNOSIS — Z79899 Other long term (current) drug therapy: Secondary | ICD-10-CM | POA: Diagnosis not present

## 2023-07-08 DIAGNOSIS — I110 Hypertensive heart disease with heart failure: Secondary | ICD-10-CM | POA: Insufficient documentation

## 2023-07-08 DIAGNOSIS — I10 Essential (primary) hypertension: Secondary | ICD-10-CM

## 2023-07-08 DIAGNOSIS — I5022 Chronic systolic (congestive) heart failure: Secondary | ICD-10-CM | POA: Diagnosis not present

## 2023-07-08 DIAGNOSIS — Z23 Encounter for immunization: Secondary | ICD-10-CM | POA: Diagnosis not present

## 2023-07-08 DIAGNOSIS — Z7982 Long term (current) use of aspirin: Secondary | ICD-10-CM | POA: Diagnosis not present

## 2023-07-08 MED ORDER — ENTRESTO 97-103 MG PO TABS: 1.0000 | ORAL_TABLET | Freq: Two times a day (BID) | ORAL | 1 refills | Status: DC

## 2023-07-08 NOTE — Progress Notes (Signed)
Patient ID: Corey Brady, male    DOB: Dec 15, 1955  MRN: [id number]  CC: Follow-up (Follow-up. /No questions / concerns/Yes to flu & shingles vax)   Subjective: Corey Brady is a 67 y.o. male who presents for chronic ds management. His concerns today include:  CAD s/p CABG 12/2019, chronic systolic CHF, HL, hx of atrial flutter/fib (s/p ablation 02/2020.  Experienced junctional rhythm post ablation,. ICD 08/2020  venous stasis LE BL,      HYPERTENSION/CAD/systolic CHF/ICD present Currently taking: see medication list.  He is on Entresto 97/103 mg twice a day, Farxiga 10 mg daily, spironolactone 25 mg daily, Toprol 50 mg daily.  Last seen by cardiology 07/21/2022. Med Adherence: [x]  Yes    []  No Medication side effects: []  Yes    [x]  No Adherence with salt restriction: []  Yes    []  No SOB? []  Yes    [x]  No.  Chest Pain?: []  Yes    [x]  No Leg swelling?: []  Yes    [x]  No Headaches?: []  Yes    [x]  No Dizziness? []  Yes    [x]  No Comments:  No firing since placed.  Compliant with aspirin and Lipitor.  HM: Yes to flu shot and shingles vaccine.  Patient Active Problem List   Diagnosis Date Noted   Atypical atrial flutter (HCC) 10/19/2022   Venous stasis of both lower extremities 05/24/2020   History of atrial flutter 05/24/2020   S/P CABG (coronary artery bypass graft) 01/30/2020     Current Outpatient Medications on File Prior to Visit  Medication Sig Dispense Refill   aspirin 81 MG chewable tablet Chew 1 tablet (81 mg total) by mouth daily.     atorvastatin (LIPITOR) 20 MG tablet Take 1 tablet (20 mg total) by mouth daily. 90 tablet 3   dapagliflozin propanediol (FARXIGA) 10 MG TABS tablet TAKE 1 TABLET(10 MG) BY MOUTH DAILY. NEED FOLLOW UP APPOINTMENT FOR ANYMORE REFILLS 60 tablet 6   metoprolol succinate (TOPROL-XL) 50 MG 24 hr tablet TAKE 1 TABLET(50 MG) BY MOUTH DAILY 30 tablet 3   sacubitril-valsartan (ENTRESTO) 97-103 MG TAKE 1 TABLET BY MOUTH TWICE DAILY 60 tablet 0    spironolactone (ALDACTONE) 25 MG tablet TAKE 1 TABLET(25 MG) BY MOUTH DAILY 30 tablet 3   traMADol (ULTRAM) 50 MG tablet Take 50 mg by mouth every 6 (six) hours as needed. Take 1 tablet (50 MG total by mouth every 6 hours as needed for moderate pain.     No current facility-administered medications on file prior to visit.    Allergies  Allergen Reactions   Other Other (See Comments)    No blood products    Social History   Socioeconomic History   Marital status: Single    Spouse name: Not on file   Number of children: Not on file   Years of education: Not on file   Highest education level: Not on file  Occupational History   Not on file  Tobacco Use   Smoking status: Never   Smokeless tobacco: Never  Substance and Sexual Activity   Alcohol use: Yes    Comment: has 1-2 drinks every 2-3 days   Drug use: Not on file   Sexual activity: Not on file  Other Topics Concern   Not on file  Social History Narrative   Not on file   Social Determinants of Health   Financial Resource Strain: Low Risk  (01/26/2020)   Overall Financial Resource Strain (CARDIA)    Difficulty of  Paying Living Expenses: Not very hard  Food Insecurity: No Food Insecurity (12/01/2022)   Hunger Vital Sign    Worried About Running Out of Food in the Last Year: Never true    Ran Out of Food in the Last Year: Never true  Transportation Needs: No Transportation Needs (01/26/2020)   PRAPARE - Administrator, Civil Service (Medical): No    Lack of Transportation (Non-Medical): No  Physical Activity: Sufficiently Active (12/01/2022)   Exercise Vital Sign    Days of Exercise per Week: 3 days    Minutes of Exercise per Session: 60 min  Stress: No Stress Concern Present (12/01/2022)   [employer] of Occupational Health - Occupational Stress Questionnaire    Feeling of Stress : Not at all  Social Connections: Moderately Integrated (12/01/2022)   Social Connection and Isolation Panel [NHANES]     Frequency of Communication with Friends and Family: Twice a week    Frequency of Social Gatherings with Friends and Family: Three times a week    Attends Religious Services: 1 to 4 times per year    Active Member of Clubs or Organizations: Yes    Attends Banker Meetings: 1 to 4 times per year    Marital Status: Never married  Intimate Partner Violence: Not At Risk (12/01/2022)   Humiliation, Afraid, Rape, and Kick questionnaire    Fear of Current or Ex-Partner: No    Emotionally Abused: No    Physically Abused: No    Sexually Abused: No    Family History  Problem Relation Age of Onset   Gout Sister     Past Surgical History:  Procedure Laterality Date   A-FLUTTER ABLATION N/A 03/15/2020   Procedure: A-FLUTTER ABLATION;  Surgeon: Regan Lemming, MD;  Location: [facility] INVASIVE [facility] LAB;  Service: Cardiovascular;  Laterality: N/A;   CARDIOVERSION N/A 02/06/2020   Procedure: CARDIOVERSION;  Surgeon: Dolores Patty, MD;  Location: [facility] ENDOSCOPY;  Service: Cardiovascular;  Laterality: N/A;   CORONARY ARTERY BYPASS GRAFT N/A 01/30/2020   Procedure: OFF PUMP CORONARY ARTERY BYPASS GRAFTING (CABG) TIMES ONE;  Surgeon: Corliss Skains, MD;  Location: [facility] OR;  Service: Open Heart Surgery;  Laterality: N/A;  swan only   ICD IMPLANT N/A 09/24/2020   Procedure: ICD IMPLANT;  Surgeon: Regan Lemming, MD;  Location[facility] INVASIVE CV LAB;  Service: Cardiovascular;  Laterality: N/A;   RIGHT/LEFT HEART CATH AND CORONARY ANGIOGRAPHY N/A 01/26/2020   Procedure: RIGHT/LEFT HEART CATH AND CORONARY ANGIOGRAPHY;  Surgeon: Dolores Patty, MD;  Location: [facility] INVASIVE CV LAB;  Service: Cardiovascular;  Laterality: N/A;   TEE WITHOUT CARDIOVERSION N/A 01/30/2020   Procedure: TRANSESOPHAGEAL ECHOCARDIOGRAM (TEE);  Surgeon: Corliss Skains, MD;  Location: [facility] OR;  Service: Open Heart Surgery;  Laterality: N/A;   TEE WITHOUT CARDIOVERSION N/A 02/06/2020   Procedure: TRANSESOPHAGEAL  ECHOCARDIOGRAM (TEE);  Surgeon: Dolores Patty, MD;  Location: [facility] ENDOSCOPY;  Service: Cardiovascular;  Laterality: N/A;    ROS: Review of Systems Negative except as stated above  PHYSICAL EXAM: BP (!) 101/58 (BP Location: Left Arm, Patient Position: Sitting, Cuff Size: Large)   Pulse 86   Temp 98.2 F (36.8 C) (Oral)   Ht 6' (1.829 m)   Wt 229 lb (103.9 kg)   SpO2 97%   BMI 31.06 kg/m   Wt Readings from Last 3 Encounters:  07/08/23 229 lb (103.9 kg)  12/01/22 224 lb (101.6 kg)  10/19/22 226 lb (102.5 kg)  Physical Exam  General appearance - alert, well appearing, and in no distress Mental status - normal mood, behavior, speech, dress, motor activity, and thought processes Chest - clear to auscultation, no wheezes, rales or rhonchi, symmetric air entry Heart - normal rate, regular rhythm, normal S1, S2, no murmurs, rubs, clicks or gallops Extremities - peripheral pulses normal, no pedal edema, no clubbing or cyanosis     Latest Ref Rng & Units 10/19/2022    3:46 PM 07/21/2022   12:37 PM 03/11/2021   11:43 AM  CMP  Glucose 70 - 99 mg/dL  638  756   BUN 8 - 23 mg/dL  11  15   Creatinine 4.33 - 1.24 mg/dL  2.95  1.88   Sodium 416 - 145 mmol/L  137  137   Potassium 3.5 - 5.1 mmol/L  4.5  4.1   Chloride 98 - 111 mmol/L  106  106   CO2 22 - 32 mmol/L  24  24   Calcium 8.9 - 10.3 mg/dL  9.3  9.2   Total Protein 6.0 - 8.5 g/dL 7.0     Total Bilirubin 0.0 - 1.2 mg/dL 0.6     Alkaline Phos 44 - 121 IU/L 83     AST 0 - 40 IU/L 28     ALT 0 - 44 IU/L 37      Lipid Panel     Component Value Date/Time   CHOL 119 10/19/2022 1546   TRIG 86 10/19/2022 1546   HDL 45 10/19/2022 1546   CHOLHDL 2.6 10/19/2022 1546   CHOLHDL 3.1 02/12/2021 1046   VLDL 28 02/12/2021 1046   LDLCALC 57 10/19/2022 1546    CBC    Component Value Date/Time   WBC 6.4 10/19/2022 1546   WBC 7.0 03/06/2021 1422   RBC 5.27 10/19/2022 1546   RBC 5.16 03/06/2021 1422   HGB 16.2 10/19/2022 1546    HCT 47.1 10/19/2022 1546   PLT 202 10/19/2022 1546   MCV 89 10/19/2022 1546   MCH 30.7 10/19/2022 1546   MCH 31.4 03/06/2021 1422   MCHC 34.4 10/19/2022 1546   MCHC 33.4 03/06/2021 1422   RDW 12.7 10/19/2022 1546   LYMPHSABS 1.7 03/06/2021 1422   MONOABS 0.6 03/06/2021 1422   EOSABS 0.1 03/06/2021 1422   BASOSABS 0.1 03/06/2021 1422    ASSESSMENT AND PLAN:  1. Coronary artery disease involving native coronary artery of native heart without angina pectoris Stable.  Continue atorvastatin, aspirin, and metoprolol - Ambulatory referral to Cardiology  2. Chronic systolic congestive heart failure (HCC) Stable and compensated.  Continue spironolactone 25 mg daily, Entresto 97/103 mg daily, Farxiga 10 mg daily and Toprol 50 mg daily - sacubitril-valsartan (ENTRESTO) 97-103 MG; Take 1 tablet by mouth 2 (two) times daily.  Dispense: 180 tablet; Refill: 1 - Basic Metabolic Panel; Future - Ambulatory referral to Cardiology  3. Essential hypertension Controlled.  Continue spironolactone and metoprolol.  4. Encounter for immunization Given today - Flu Vaccine Trivalent High Dose (Fluad)  5. Need for shingles vaccine Given today.    Patient was given the opportunity to ask questions.  Patient verbalized understanding of the plan and was able to repeat key elements of the plan.   This documentation was completed using Paediatric nurse.  Any transcriptional errors are unintentional.  Orders Placed This Encounter  Procedures   Flu Vaccine Trivalent High Dose (Fluad)   Varicella-zoster vaccine IM     Requested Prescriptions   Pending Prescriptions  Disp Refills   sacubitril-valsartan (ENTRESTO) 97-103 MG 180 tablet 1    Sig: Take 1 tablet by mouth 2 (two) times daily.    No follow-ups on file.  Jonah Blue, MD, FACP

## 2023-07-09 NOTE — Progress Notes (Signed)
Remote ICD transmission.   

## 2023-07-12 ENCOUNTER — Ambulatory Visit: Payer: Medicare Other | Attending: Family Medicine

## 2023-07-12 DIAGNOSIS — I5022 Chronic systolic (congestive) heart failure: Secondary | ICD-10-CM

## 2023-07-13 LAB — BASIC METABOLIC PANEL
BUN/Creatinine Ratio: 14 (ref 10–24)
BUN: 14 mg/dL (ref 8–27)
CO2: 21 mmol/L (ref 20–29)
Calcium: 9.2 mg/dL (ref 8.6–10.2)
Chloride: 102 mmol/L (ref 96–106)
Creatinine, Ser: 1.03 mg/dL (ref 0.76–1.27)
Glucose: 100 mg/dL — ABNORMAL HIGH (ref 70–99)
Potassium: 5.1 mmol/L (ref 3.5–5.2)
Sodium: 139 mmol/L (ref 134–144)
eGFR: 80 mL/min/{1.73_m2} (ref >59)

## 2023-08-09 ENCOUNTER — Other Ambulatory Visit (HOSPITAL_COMMUNITY): Payer: Self-pay | Admitting: Internal Medicine

## 2023-08-09 ENCOUNTER — Other Ambulatory Visit (HOSPITAL_COMMUNITY): Payer: Self-pay | Admitting: Family Medicine

## 2023-09-05 ENCOUNTER — Other Ambulatory Visit (HOSPITAL_COMMUNITY): Payer: Self-pay | Admitting: Internal Medicine

## 2023-09-06 ENCOUNTER — Ambulatory Visit: Payer: Medicare Other | Attending: Cardiology | Admitting: Cardiology

## 2023-09-11 ENCOUNTER — Other Ambulatory Visit (HOSPITAL_COMMUNITY): Payer: Self-pay | Admitting: Family Medicine

## 2023-09-23 ENCOUNTER — Ambulatory Visit (INDEPENDENT_AMBULATORY_CARE_PROVIDER_SITE_OTHER): Payer: Medicare Other

## 2023-09-23 DIAGNOSIS — I255 Ischemic cardiomyopathy: Secondary | ICD-10-CM | POA: Diagnosis not present

## 2023-09-23 LAB — CUP PACEART REMOTE DEVICE CHECK
Battery Remaining Longevity: 116 mo
Battery Voltage: 3.02 V
Brady Statistic RV Percent Paced: 0.03 %
Date Time Interrogation Session: 20241226033526
HighPow Impedance: 70 Ohm
Implantable Lead Connection Status: 753985
Implantable Lead Implant Date: 20211228
Implantable Lead Location: 753860
Implantable Pulse Generator Implant Date: 20211228
Lead Channel Impedance Value: 380 Ohm
Lead Channel Impedance Value: 437 Ohm
Lead Channel Pacing Threshold Amplitude: 0.5 V
Lead Channel Pacing Threshold Pulse Width: 0.4 ms
Lead Channel Sensing Intrinsic Amplitude: 21.5 mV
Lead Channel Sensing Intrinsic Amplitude: 21.5 mV
Lead Channel Setting Pacing Amplitude: 2 V
Lead Channel Setting Pacing Pulse Width: 0.4 ms
Lead Channel Setting Sensing Sensitivity: 0.3 mV
Zone Setting Status: 755011
Zone Setting Status: 755011

## 2023-10-21 ENCOUNTER — Other Ambulatory Visit (HOSPITAL_COMMUNITY): Payer: Self-pay

## 2023-10-21 ENCOUNTER — Telehealth (HOSPITAL_COMMUNITY): Payer: Self-pay

## 2023-10-21 NOTE — Telephone Encounter (Signed)
Patient Advocate Encounter  Received notification that prior authorization is required for Dapagliflozin. Test claims indicate that this plan prefers brand name Marcelline Deist. Marcelline Deist returns a refill too soon rejection, and has been filled by the pharmacy.  No prior auth needed at this time.  Burnell Blanks, CPhT Rx Patient Advocate Phone: [phone]

## 2023-11-03 ENCOUNTER — Other Ambulatory Visit (HOSPITAL_COMMUNITY): Payer: Self-pay | Admitting: Internal Medicine

## 2023-11-10 ENCOUNTER — Other Ambulatory Visit: Payer: Self-pay | Admitting: Internal Medicine

## 2023-11-10 ENCOUNTER — Other Ambulatory Visit (HOSPITAL_COMMUNITY): Payer: Self-pay | Admitting: Internal Medicine

## 2023-11-10 NOTE — Telephone Encounter (Signed)
Last Fill: 08/09/23  Last OV: 07/08/23 Next OV: 11/12/23  Routing to provider for review/authorization.

## 2023-11-10 NOTE — Telephone Encounter (Signed)
Copied from CRM [id number]. Topic: Clinical - Medication Refill >> Nov 10, 2023 12:39 PM Fonda Kinder J wrote: Most Recent Primary Care Visit:  Provider: [facility]  Department: [facility]  Visit Type: LAB  Date: 07/12/2023 dapagliflozin propanediol (FARXIGA) 10 MG Medication: dapagliflozin propanediol (FARXIGA) 10 MG  Has the patient contacted their pharmacy? Yes (Agent: If no, request that the patient contact the pharmacy for the refill. If patient does not wish to contact the pharmacy document the reason why and proceed with request.) (Agent: If yes, when and what did the pharmacy advise?) No more refills available   Is this the correct pharmacy for this prescription? No If no, delete pharmacy and type the correct one.  This is the patient's preferred pharmacy:  [facility] [address] [place], [place] - [address] AT [facility] [address] [place] [address] Phone: [phone] Fax: [phone]     Has the prescription been filled recently? No  Is the patient out of the medication? Yes  Has the patient been seen for an appointment in the last year OR does the patient have an upcoming appointment? Yes  Can we respond through MyChart? No  Agent: Please be advised that Rx refills may take up to 3 business days. We ask that you follow-up with your pharmacy.

## 2023-11-12 ENCOUNTER — Encounter: Payer: Self-pay | Admitting: Internal Medicine

## 2023-11-12 ENCOUNTER — Other Ambulatory Visit: Payer: Self-pay

## 2023-11-12 ENCOUNTER — Ambulatory Visit: Payer: Medicare Other | Attending: Internal Medicine | Admitting: Internal Medicine

## 2023-11-12 VITALS — BP 94/63 | HR 88 | Temp 97.8°F | Ht 72.0 in | Wt 229.0 lb

## 2023-11-12 DIAGNOSIS — I110 Hypertensive heart disease with heart failure: Secondary | ICD-10-CM | POA: Insufficient documentation

## 2023-11-12 DIAGNOSIS — Z23 Encounter for immunization: Secondary | ICD-10-CM | POA: Diagnosis not present

## 2023-11-12 DIAGNOSIS — Z7982 Long term (current) use of aspirin: Secondary | ICD-10-CM | POA: Insufficient documentation

## 2023-11-12 DIAGNOSIS — Z95810 Presence of automatic (implantable) cardiac defibrillator: Secondary | ICD-10-CM | POA: Diagnosis not present

## 2023-11-12 DIAGNOSIS — I2510 Atherosclerotic heart disease of native coronary artery without angina pectoris: Secondary | ICD-10-CM | POA: Insufficient documentation

## 2023-11-12 DIAGNOSIS — Z951 Presence of aortocoronary bypass graft: Secondary | ICD-10-CM | POA: Insufficient documentation

## 2023-11-12 DIAGNOSIS — I10 Essential (primary) hypertension: Secondary | ICD-10-CM

## 2023-11-12 DIAGNOSIS — I5022 Chronic systolic (congestive) heart failure: Secondary | ICD-10-CM | POA: Insufficient documentation

## 2023-11-12 DIAGNOSIS — Z79899 Other long term (current) drug therapy: Secondary | ICD-10-CM | POA: Insufficient documentation

## 2023-11-12 MED ORDER — ATORVASTATIN CALCIUM 20 MG PO TABS: 20.0000 mg | ORAL_TABLET | Freq: Every day | ORAL | 1 refills | Status: DC

## 2023-11-12 MED ORDER — SACUBITRIL-VALSARTAN 97-103 MG PO TABS: 1.0000 | ORAL_TABLET | Freq: Two times a day (BID) | ORAL | 1 refills | Status: DC

## 2023-11-12 MED ORDER — ZOSTER VAC RECOMB ADJUVANTED 50 MCG/0.5ML IM SUSR: 0.5000 mL | Freq: Once | INTRAMUSCULAR | 0 refills | Status: AC

## 2023-11-12 NOTE — Progress Notes (Signed)
Patient ID: Corey Brady, male    DOB: [birth year]  MRN: [id number]  CC: Coronary Artery Disease (Coronary Artery dx f/u. /No questions / concerns/Yes to shingles vax)   Subjective: Corey Brady is a 68 y.o. male who presents for chronic ds management. His concerns today include:  CAD s/p CABG 12/2019, chronic systolic CHF, HL, hx of atrial flutter/fib (s/p ablation 02/2020.  Experienced junctional rhythm post ablation,. ICD 08/2020  venous stasis LE BL,      HYPERTENSION/CAD/systolic CHF/ICD present Currently taking: see medication list.  He is on Entresto 97/103 mg twice a day, Farxiga 10 mg daily, spironolactone 25 mg daily, Toprol 50 mg daily.  Last seen by cardiology 07/21/2022.  Has appointment coming up next month. Med Adherence: [x]  Yes    []  No Medication side effects: []  Yes    [x]  No Adherence with salt restriction: [x]  Yes    []  No SOB? []  Yes    [x]  No.  Chest Pain?: []  Yes    [x]  No Leg swelling?: []  Yes    [x]  No Headaches?: []  Yes    [x]  No No firing of the ICD.  HM: Due for second shingles vaccine.  Coming due for Medicare wellness visit. Patient Active Problem List   Diagnosis Date Noted   Atypical atrial flutter (HCC) 10/19/2022   Venous stasis of both lower extremities 05/24/2020   History of atrial flutter 05/24/2020   S/P CABG (coronary artery bypass graft) 01/30/2020     Current Outpatient Medications on File Prior to Visit  Medication Sig Dispense Refill   aspirin 81 MG chewable tablet Chew 1 tablet (81 mg total) by mouth daily.     dapagliflozin propanediol (FARXIGA) 10 MG TABS tablet TAKE 1 TABLET(10 MG) BY MOUTH DAILY. FOLLOW UP APPOINTMENT NEEDED FOR MORE REFILLS 30 tablet 0   metoprolol succinate (TOPROL-XL) 50 MG 24 hr tablet TAKE 1 TABLET(50 MG) BY MOUTH DAILY. NEED FOLLOW UP APPOINTMENT FOR MORE REFILLS 30 tablet 0   metoprolol succinate (TOPROL-XL) 50 MG 24 hr tablet TAKE 1 TABLET(50 MG) BY MOUTH DAILY. NEED FOLLOW UP APPOINTMENT FOR MORE REFILLS 30  tablet 0   spironolactone (ALDACTONE) 25 MG tablet TAKE 1 TABLET(25 MG) BY MOUTH DAILY 30 tablet 3   traMADol (ULTRAM) 50 MG tablet Take 50 mg by mouth every 6 (six) hours as needed. Take 1 tablet (50 MG total by mouth every 6 hours as needed for moderate pain.     No current facility-administered medications on file prior to visit.    Allergies  Allergen Reactions   Other Other (See Comments)    No blood products    Social History   Socioeconomic History   Marital status: Single    Spouse name: Not on file   Number of children: Not on file   Years of education: Not on file   Highest education level: Not on file  Occupational History   Not on file  Tobacco Use   Smoking status: Never   Smokeless tobacco: Never  Substance and Sexual Activity   Alcohol use: Yes    Comment: has 1-2 drinks every 2-3 days   Drug use: Not on file   Sexual activity: Not on file  Other Topics Concern   Not on file  Social History Narrative   Not on file   Social Drivers of Health   Financial Resource Strain: Low Risk  (01/26/2020)   Overall Financial Resource Strain (CARDIA)    Difficulty of Paying Living  Expenses: Not very hard  Food Insecurity: No Food Insecurity (11/12/2023)   Hunger Vital Sign    Worried About Running Out of Food in the Last Year: Never true    Ran Out of Food in the Last Year: Never true  Transportation Needs: No Transportation Needs (01/26/2020)   PRAPARE - Administrator, Civil Service (Medical): No    Lack of Transportation (Non-Medical): No  Physical Activity: Sufficiently Active (12/01/2022)   Exercise Vital Sign    Days of Exercise per Week: 3 days    Minutes of Exercise per Session: 60 min  Stress: No Stress Concern Present (12/01/2022)   [employer] of Occupational Health - Occupational Stress Questionnaire    Feeling of Stress : Not at all  Social Connections: Moderately Integrated (12/01/2022)   Social Connection and Isolation Panel [NHANES]     Frequency of Communication with Friends and Family: Twice a week    Frequency of Social Gatherings with Friends and Family: Three times a week    Attends Religious Services: 1 to 4 times per year    Active Member of Clubs or Organizations: Yes    Attends Banker Meetings: 1 to 4 times per year    Marital Status: Never married  Intimate Partner Violence: Not At Risk (12/01/2022)   Humiliation, Afraid, Rape, and Kick questionnaire    Fear of Current or Ex-Partner: No    Emotionally Abused: No    Physically Abused: No    Sexually Abused: No    Family History  Problem Relation Age of Onset   Gout Sister     Past Surgical History:  Procedure Laterality Date   A-FLUTTER ABLATION N/A 03/15/2020   Procedure: A-FLUTTER ABLATION;  Surgeon: Regan Lemming, MD;  Location: [facility] INVASIVE CV LAB;  Service: Cardiovascular;  Laterality: N/A;   CARDIOVERSION N/A 02/06/2020   Procedure: CARDIOVERSION;  Surgeon: Dolores Patty, MD;  Location: [facility] ENDOSCOPY;  Service: Cardiovascular;  Laterality: N/A;   CORONARY ARTERY BYPASS GRAFT N/A 01/30/2020   Procedure: OFF PUMP CORONARY ARTERY BYPASS GRAFTING (CABG) TIMES ONE;  Surgeon: Corliss Skains, MD;  Location: [facility] OR;  Service: Open Heart Surgery;  Laterality: N/A;  swan only   ICD IMPLANT N/A 09/24/2020   Procedure: ICD IMPLANT;  Surgeon: Regan Lemming, MD;  Location: [facility] INVASIVE CV LAB;  Service: Cardiovascular;  Laterality: N/A;   RIGHT/LEFT HEART CATH AND CORONARY ANGIOGRAPHY N/A 01/26/2020   Procedure: RIGHT/LEFT HEART CATH AND CORONARY ANGIOGRAPHY;  Surgeon: Dolores Patty, MD;  Location: [facility] INVASIVE CV LAB;  Service: Cardiovascular;  Laterality: N/A;   TEE WITHOUT CARDIOVERSION N/A 01/30/2020   Procedure: TRANSESOPHAGEAL ECHOCARDIOGRAM (TEE);  Surgeon: Corliss Skains, MD;  Location: [facility] OR;  Service: Open Heart Surgery;  Laterality: N/A;   TEE WITHOUT CARDIOVERSION N/A 02/06/2020   Procedure: TRANSESOPHAGEAL  ECHOCARDIOGRAM (TEE);  Surgeon: Dolores Patty, MD;  Location: [facility] ENDOSCOPY;  Service: Cardiovascular;  Laterality: N/A;    ROS: Review of Systems Negative except as stated above  PHYSICAL EXAM: BP 94/63 (BP Location: Left Arm, Patient Position: Sitting, Cuff Size: Large)   Pulse 88   Temp 97.8 F (36.6 C) (Oral)   Ht 6' (1.829 m)   Wt 229 lb (103.9 kg)   SpO2 97%   BMI 31.06 kg/m   Physical Exam   General appearance - alert, well appearing, and in no distress Mental status - normal mood, behavior, speech, dress, motor activity, and thought processes Neck -  supple, no significant adenopathy Chest - clear to auscultation, no wheezes, rales or rhonchi, symmetric air entry Heart - normal rate, regular rhythm, normal S1, S2, no murmurs, rubs, clicks or gallops Extremities - peripheral pulses normal, no pedal edema, no clubbing or cyanosis     Latest Ref Rng & Units 07/12/2023    2:11 PM 10/19/2022    3:46 PM 07/21/2022   12:37 PM  CMP  Glucose 70 - 99 mg/dL 213   086   BUN 8 - 27 mg/dL 14   11   Creatinine 5.78 - 1.27 mg/dL 4.69   6.29   Sodium 528 - 144 mmol/L 139   137   Potassium 3.5 - 5.2 mmol/L 5.1   4.5   Chloride 96 - 106 mmol/L 102   106   CO2 20 - 29 mmol/L 21   24   Calcium 8.6 - 10.2 mg/dL 9.2   9.3   Total Protein 6.0 - 8.5 g/dL  7.0    Total Bilirubin 0.0 - 1.2 mg/dL  0.6    Alkaline Phos 44 - 121 IU/L  83    AST 0 - 40 IU/L  28    ALT 0 - 44 IU/L  37     Lipid Panel     Component Value Date/Time   CHOL 119 10/19/2022 1546   TRIG 86 10/19/2022 1546   HDL 45 10/19/2022 1546   CHOLHDL 2.6 10/19/2022 1546   CHOLHDL 3.1 02/12/2021 1046   VLDL 28 02/12/2021 1046   LDLCALC 57 10/19/2022 1546    CBC    Component Value Date/Time   WBC 6.4 10/19/2022 1546   WBC 7.0 03/06/2021 1422   RBC 5.27 10/19/2022 1546   RBC 5.16 03/06/2021 1422   HGB 16.2 10/19/2022 1546   HCT 47.1 10/19/2022 1546   PLT 202 10/19/2022 1546   MCV 89 10/19/2022 1546   MCH  30.7 10/19/2022 1546   MCH 31.4 03/06/2021 1422   MCHC 34.4 10/19/2022 1546   MCHC 33.4 03/06/2021 1422   RDW 12.7 10/19/2022 1546   LYMPHSABS 1.7 03/06/2021 1422   MONOABS 0.6 03/06/2021 1422   EOSABS 0.1 03/06/2021 1422   BASOSABS 0.1 03/06/2021 1422    ASSESSMENT AND PLAN: 1. Essential hypertension (Primary) Control.  Continue Entresto 97/103 mg twice a day, Toprol 50 mg daily, spironolactone - CBC; Future - Comprehensive metabolic panel; Future  2. Chronic systolic congestive heart failure (HCC) Stable and compensated.  Continue Entresto, spironolactone, Toprol 50 mg daily - sacubitril-valsartan (ENTRESTO) 97-103 MG; Take 1 tablet by mouth 2 (two) times daily.  Dispense: 180 tablet; Refill: 1  3. Coronary artery disease involving native coronary artery of native heart without angina pectoris Stable.  Continue Toprol 50 mg daily, atorvastatin and aspirin - atorvastatin (LIPITOR) 20 MG tablet; Take 1 tablet (20 mg total) by mouth daily. NEEDS FOLLOW UP APPOINTMENT FOR MORE REFILLS  Dispense: 90 tablet; Refill: 1 - Lipid panel; Future  4. Need for shingles vaccine Printed prescription given for him to take to the pharmacy to get his Shingrix vaccine. - Zoster Vaccine Adjuvanted [facility]) injection; Inject 0.5 mLs into the muscle once for 1 dose.  Dispense: 0.5 mL; Refill: 0    Patient was given the opportunity to ask questions.  Patient verbalized understanding of the plan and was able to repeat key elements of the plan.   This documentation was completed using Paediatric nurse.  Any transcriptional errors are unintentional.  Orders Placed This Encounter  Procedures   CBC   Comprehensive metabolic panel   Lipid panel     Requested Prescriptions   Signed Prescriptions Disp Refills   sacubitril-valsartan (ENTRESTO) 97-103 MG 180 tablet 1    Sig: Take 1 tablet by mouth 2 (two) times daily.   atorvastatin (LIPITOR) 20 MG tablet 90 tablet 1    Sig:  Take 1 tablet (20 mg total) by mouth daily. NEEDS FOLLOW UP APPOINTMENT FOR MORE REFILLS   Zoster Vaccine Adjuvanted [facility]) injection 0.5 mL 0    Sig: Inject 0.5 mLs into the muscle once for 1 dose.    Return in about 4 months (around 03/11/2024) for Medicare Wellness Visit in 4   wks with CMA.  Jonah Blue, MD, FACP

## 2023-11-13 LAB — CBC
Hematocrit: 47.7 % (ref 37.5–51.0)
Hemoglobin: 16.1 g/dL (ref 13.0–17.7)
MCH: 31.1 pg (ref 26.6–33.0)
MCHC: 33.8 g/dL (ref 31.5–35.7)
MCV: 92 fL (ref 79–97)
Platelets: 205 10*3/uL (ref 150–450)
RBC: 5.18 x10E6/uL (ref 4.14–5.80)
RDW: 12.8 % (ref 11.6–15.4)
WBC: 7.2 10*3/uL (ref 3.4–10.8)

## 2023-11-13 LAB — COMPREHENSIVE METABOLIC PANEL
ALT: 24 [IU]/L (ref 0–44)
AST: 27 [IU]/L (ref 0–40)
Albumin: 4.4 g/dL (ref 3.9–4.9)
Alkaline Phosphatase: 72 [IU]/L (ref 44–121)
BUN/Creatinine Ratio: 15 (ref 10–24)
BUN: 16 mg/dL (ref 8–27)
Bilirubin Total: 0.6 mg/dL (ref 0.0–1.2)
CO2: 25 mmol/L (ref 20–29)
Calcium: 9.6 mg/dL (ref 8.6–10.2)
Chloride: 104 mmol/L (ref 96–106)
Creatinine, Ser: 1.08 mg/dL (ref 0.76–1.27)
Globulin, Total: 2.5 g/dL (ref 1.5–4.5)
Glucose: 66 mg/dL — ABNORMAL LOW (ref 70–99)
Potassium: 5.2 mmol/L (ref 3.5–5.2)
Sodium: 141 mmol/L (ref 134–144)
Total Protein: 6.9 g/dL (ref 6.0–8.5)
eGFR: 75 mL/min/{1.73_m2} (ref >59)

## 2023-11-13 LAB — LIPID PANEL
Chol/HDL Ratio: 2.6 {ratio} (ref 0.0–5.0)
Cholesterol, Total: 117 mg/dL (ref 100–199)
HDL: 45 mg/dL (ref >39)
LDL Chol Calc (NIH): 49 mg/dL (ref 0–99)
Triglycerides: 128 mg/dL (ref 0–149)
VLDL Cholesterol Cal: 23 mg/dL (ref 5–40)

## 2023-11-29 ENCOUNTER — Ambulatory Visit: Payer: Medicare Other | Attending: Cardiology | Admitting: Cardiology

## 2023-11-29 VITALS — BP 102/66 | HR 93 | Resp 16 | Ht 72.0 in | Wt 226.6 lb

## 2023-11-29 DIAGNOSIS — Z95810 Presence of automatic (implantable) cardiac defibrillator: Secondary | ICD-10-CM

## 2023-11-29 DIAGNOSIS — I255 Ischemic cardiomyopathy: Secondary | ICD-10-CM | POA: Insufficient documentation

## 2023-11-29 DIAGNOSIS — I5022 Chronic systolic (congestive) heart failure: Secondary | ICD-10-CM

## 2023-11-29 DIAGNOSIS — Z951 Presence of aortocoronary bypass graft: Secondary | ICD-10-CM | POA: Diagnosis not present

## 2023-11-29 DIAGNOSIS — I2510 Atherosclerotic heart disease of native coronary artery without angina pectoris: Secondary | ICD-10-CM | POA: Diagnosis not present

## 2023-11-29 DIAGNOSIS — I484 Atypical atrial flutter: Secondary | ICD-10-CM

## 2023-11-29 DIAGNOSIS — Z9889 Other specified postprocedural states: Secondary | ICD-10-CM

## 2023-11-29 MED ORDER — SPIRONOLACTONE 25 MG PO TABS: 25.0000 mg | ORAL_TABLET | Freq: Every evening | ORAL | Status: DC

## 2023-11-29 MED ORDER — ATORVASTATIN CALCIUM 20 MG PO TABS
20.0000 mg | ORAL_TABLET | Freq: Every evening | ORAL | Status: DC
Start: 2023-11-29 — End: 2023-12-16

## 2023-11-29 NOTE — Patient Instructions (Signed)
 Medication Instructions:  Your physician has recommended you make the following change in your medication:   CHANGE how you take Atorvastatin (Lipitor)- take in the evening  CHANGE how you take Spironolactone- take in the evening    *If you need a refill on your cardiac medications before your next appointment, please call your pharmacy*  Lab Work: None ordered today. If you have labs (blood work) drawn today and your tests are completely normal, you will receive your results only by: MyChart Message (if you have MyChart) OR A paper copy in the mail If you have any lab test that is abnormal or we need to change your treatment, we will call you to review the results.  Testing/Procedures: Your physician has requested that you have an echocardiogram prior to next office visit. Echocardiography is a painless test that uses sound waves to create images of your heart. It provides your doctor with information about the size and shape of your heart and how well your heart's chambers and valves are working. This procedure takes approximately one hour. There are no restrictions for this procedure. Please do NOT wear cologne, perfume, aftershave, or lotions (deodorant is allowed). Please arrive 15 minutes prior to your appointment time.  Please note: We ask at that you not bring children with you during ultrasound (echo/ vascular) testing. Due to room size and safety concerns, children are not allowed in the ultrasound rooms during exams. Our front office staff cannot provide observation of children in our lobby area while testing is being conducted. An adult accompanying a patient to their appointment will only be allowed in the ultrasound room at the discretion of the ultrasound technician under special circumstances. We apologize for any inconvenience.   Follow-Up: At [facility], you and your health needs are our priority.  As part of our continuing mission to provide you with exceptional heart  care, we have created designated Provider Care Teams.  These Care Teams include your primary Cardiologist (physician) and Advanced Practice Providers (APPs -  Physician Assistants and Nurse Practitioners) who all work together to provide you with the care you need, when you need it.  We recommend signing up for the patient portal called "MyChart".  Sign up information is provided on this After Visit Summary.  MyChart is used to connect with patients for Virtual Visits (Telemedicine).  Patients are able to view lab/test results, encounter notes, upcoming appointments, etc.  Non-urgent messages can be sent to your provider as well.   To learn more about what you can do with MyChart, go to [email or web address].    Your next appointment:   6 month(s)  The format for your next appointment:   In Person  Provider:   Tessa Lerner, DO {

## 2023-11-29 NOTE — Progress Notes (Signed)
 Cardiology Office Note:  .   ID:  Corey Brady, DOB Nov 20, 1955, MRN [id number] PCP:  Marcine Matar, MD  Former Cardiology Providers: Dr. Adine [facility] Providers Cardiologist:  Tessa Lerner, DO, [facility] (established care 11/29/23)  Electrophysiologist:  Will Jorja Loa, MD  Advanced Heart Failure:  Arvilla Meres, MD  Electrophysiologist:  Will Jorja Loa, MD  Click to update primary MD,subspecialty MD or APP then REFRESH:1}    Chief Complaint  Patient presents with   Coronary Artery Disease   New Patient (Initial Visit)    History of Present Illness: .   Corey Brady is a 68 y.o. Caucasian male whose past medical history and cardiovascular risk factors includes: CAD status post CABG 01/2020, HFrEF, history of paroxysmal atrial flutter status post ablation 03/16/2020, status post ICD implant 09/24/2020.  He underwent a one-vessel coronary artery bypass surgery due to significant heart disease. Post-surgery, he experienced reduced cardiac pumping activity, which prolonged his hospital stay.  Subsequently, he developed atrial flutter and underwent an ablation procedure, which successfully restored sinus rhythm. He was taken off blood thinners following the procedure.  He experienced episodes of skipped and missed heartbeats while on medication, leading to adjustments in his medication regimen. Despite these changes, his cardiac pumping activity did not improve, necessitating the implantation of a defibrillator. The defibrillator is checked every three months.  He feels 'pretty good' and is trying to stay active with household activities. No chest pain, shortness of breath, dizziness, lightheadedness, syncope, blood in urine or stool, or waking up gasping for air at night. He takes six medications in the morning and has recently had some refills. He is not aware of needing any additional refills at this time.  He has not been evaluated for sleep apnea and does not  have any known issues with snoring or breathing cessation during sleep.  Review of Systems: .   Review of Systems  Cardiovascular:  Negative for chest pain, claudication, irregular heartbeat, leg swelling, near-syncope, orthopnea, palpitations, paroxysmal nocturnal dyspnea and syncope.  Respiratory:  Negative for shortness of breath.   Hematologic/Lymphatic: Negative for bleeding problem.    Studies Reviewed:   EKG: EKG Interpretation Date/Time:  Monday November 29 2023 14:38:23 EST Ventricular Rate:  93 PR Interval:  212 QRS Duration:  110 QT Interval:  366 QTC Calculation: 455 R Axis:   -28  Text Interpretation: Sinus rhythm with 1st degree A-V block Incomplete right bundle branch block When compared with ECG of 21-Jul-2022 12:26, No significant change was found Confirmed by Tessa Lerner [phone]) on 11/29/2023 2:50:16 PM  - Echo (4/21) EF 15% with severe global HK, and moderate RV dysfunction.  Etiology possible ETOH versus HTN (denies severe ETOH use). TSH ok. HIV NR    - LHC (4/21) w/ severe 1v CAD. RHC with preserved CO.   - CABG (5/21) x 1 w/ LIMA-LAD.    - Echo (6/21): EF 25-30%, RV mildly reduced.    - Echo (9/21): EF 25-30%    - ICD (12/21)  RADIOLOGY: NA  Risk Assessment/Calculations:   NA  Labs:       Latest Ref Rng & Units 11/12/2023    4:46 PM 10/19/2022    3:46 PM 03/06/2021    2:22 PM  CBC  WBC 3.4 - 10.8 x10E3/uL 7.2  6.4  7.0   Hemoglobin 13.0 - 17.7 g/dL 19.1  47.8  29.5   Hematocrit 37.5 - 51.0 % 47.7  47.1  48.5   Platelets 150 - 450  x10E3/uL 205  202  199        Latest Ref Rng & Units 11/12/2023    4:46 PM 07/12/2023    2:11 PM 07/21/2022   12:37 PM  BMP  Glucose 70 - 99 mg/dL 66  161  096   BUN 8 - 27 mg/dL 16  14  11    Creatinine 0.76 - 1.27 mg/dL 0.45  4.09  8.11   BUN/Creat Ratio 10 - 24 15  14     Sodium 134 - 144 mmol/L 141  139  137   Potassium 3.5 - 5.2 mmol/L 5.2  5.1  4.5   Chloride 96 - 106 mmol/L 104  102  106   CO2 20 - 29  mmol/L 25  21  24    Calcium 8.6 - 10.2 mg/dL 9.6  9.2  9.3       Latest Ref Rng & Units 11/12/2023    4:46 PM 07/12/2023    2:11 PM 10/19/2022    3:46 PM  CMP  Glucose 70 - 99 mg/dL 66  914    BUN 8 - 27 mg/dL 16  14    Creatinine 7.82 - 1.27 mg/dL 9.56  2.13    Sodium 086 - 144 mmol/L 141  139    Potassium 3.5 - 5.2 mmol/L 5.2  5.1    Chloride 96 - 106 mmol/L 104  102    CO2 20 - 29 mmol/L 25  21    Calcium 8.6 - 10.2 mg/dL 9.6  9.2    Total Protein 6.0 - 8.5 g/dL 6.9   7.0   Total Bilirubin 0.0 - 1.2 mg/dL 0.6   0.6   Alkaline Phos 44 - 121 IU/L 72   83   AST 0 - 40 IU/L 27   28   ALT 0 - 44 IU/L 24   37     Lab Results  Component Value Date   CHOL 117 11/12/2023   HDL 45 11/12/2023   LDLCALC 49 11/12/2023   TRIG 128 11/12/2023   CHOLHDL 2.6 11/12/2023   No results for input(s): "LIPOA" in the last 8760 hours. No components found for: "NTPROBNP" No results for input(s): "PROBNP" in the last 8760 hours. No results for input(s): "TSH" in the last 8760 hours.  Physical Exam:    Today's Vitals   11/29/23 1435  BP: 102/66  Pulse: 93  Resp: 16  SpO2: 95%  Weight: 226 lb 9.6 oz (102.8 kg)  Height: 6' (1.829 m)   Body mass index is 30.73 kg/m. Wt Readings from Last 3 Encounters:  11/29/23 226 lb 9.6 oz (102.8 kg)  11/12/23 229 lb (103.9 kg)  07/08/23 229 lb (103.9 kg)    Physical Exam  Constitutional: No distress.  hemodynamically stable  Neck: No JVD present.  Cardiovascular: Normal rate, regular rhythm, S1 normal and S2 normal. Exam reveals no gallop, no S3 and no S4.  No murmur heard. Pulses:      Dorsalis pedis pulses are 2+ on the right side and 2+ on the left side.       Posterior tibial pulses are 1+ on the right side and 1+ on the left side.  Pulmonary/Chest: Effort normal and breath sounds normal. No stridor. He has no wheezes. He has no rales.  ICD present left infraclavicular region.  Musculoskeletal:        General: No edema.     Cervical back:  Neck supple.  Skin: Skin is warm.   Impression &  Recommendation(s):  Impression:   ICD-10-CM   1. Coronary artery disease involving native coronary artery of native heart without angina pectoris  I25.10 EKG 12-Lead    atorvastatin (LIPITOR) 20 MG tablet    ECHOCARDIOGRAM COMPLETE    2. Ischemic cardiomyopathy  I25.5     3. Implantable cardioverter-defibrillator (ICD) in situ  Z95.810     4. S/P CABG (coronary artery bypass graft)  Z95.1     5. Chronic systolic congestive heart failure (HCC)  I50.22     6. Atypical atrial flutter (HCC)  I48.4     7. Hx of prior ablation treatment  Z98.890        Recommendation(s):  Coronary artery disease involving native coronary artery of native heart without angina pectoris Ischemic cardiomyopathy S/P CABG (coronary artery bypass graft) Chronic systolic congestive heart failure (HCC) Echocardiogram April 2021: LVEF 15% with severe global hypokinesis and moderately reduced RV function. Etiology likely secondary to underlying ischemia, and a history of EtOH abuse, uncontrolled hypertension. LHC -noted severe one-vessel disease.  Underwent one-vessel CABG with LIMA to the LAD 01/2020. Echocardiogram September 2021: LVEF 25-30%. Status post ICD implant December 2021. Presents today for follow-up and to establish care. I am seeing him for the first time. NYHA class I. Continue Farxiga 10 mg p.o. every morning. Continue Toprol-XL 50 mg p.o. every morning. Continue Entresto 97/103 mg p.o. twice daily. Continue spironolactone 25 mg p.o. every afternoon.  Medication refilled Echo will be ordered to evaluate for structural heart disease and left ventricular systolic function. For CAD management continue aspirin and statin therapy.  Implantable cardioverter-defibrillator (ICD) in situ Most recent device check December 2024-battery and lead parameters are stable Continues to follow-up with EP at least on an annual basis  Atypical atrial flutter  [facility]) Hx of prior ablation treatment History of atrial flutter ablation in June 2021 with [name]. Has had periods of junctional rhythm in the past status post ablation and therefore is off of amiodarone and digoxin as per prior progress notes. Patient has been taken off of Eliquis as he remains in sinus rhythm.  Orders Placed:  Orders Placed This Encounter  Procedures   EKG 12-Lead   ECHOCARDIOGRAM COMPLETE    Standing Status:   Future    Expected Date:   12/06/2023    Expiration Date:   11/28/2024    Where should this test be performed:   [facility])    Does the patient weigh less than or greater than 250 lbs?:   Patient weighs less than 250 lbs    Perflutren DEFINITY (image enhancing agent) should be administered unless hypersensitivity or allergy exist:   Administer Perflutren    Reason for exam-Echo:   Other-Full Diagnosis List    Full ICD-10/Reason for Exam:   CAD (coronary artery disease) [161096]   Reviewed the last progress note from advanced heart failure team October 2023, EKG ordered and independently reviewed, last device interrogation report from December 2024 reviewed, labs from 11/12/2023 independently reviewed, ordered additional diagnostic workup as well as medication management for his chronic cardiovascular comorbidities  Final Medication List:    Meds ordered this encounter  Medications   atorvastatin (LIPITOR) 20 MG tablet    Sig: Take 1 tablet (20 mg total) by mouth every evening. NEEDS FOLLOW UP APPOINTMENT FOR MORE REFILLS   spironolactone (ALDACTONE) 25 MG tablet    Sig: Take 1 tablet (25 mg total) by mouth every evening.    Medications Discontinued During This Encounter  Medication Reason   spironolactone (ALDACTONE) 25 MG tablet    atorvastatin (LIPITOR) 20 MG tablet      Current Outpatient Medications:    aspirin 81 MG chewable tablet, Chew 1 tablet (81 mg total) by mouth daily., Disp:  , Rfl:    dapagliflozin propanediol  (FARXIGA) 10 MG TABS tablet, TAKE 1 TABLET(10 MG) BY MOUTH DAILY. FOLLOW UP APPOINTMENT NEEDED FOR MORE REFILLS, Disp: 30 tablet, Rfl: 0   metoprolol succinate (TOPROL-XL) 50 MG 24 hr tablet, TAKE 1 TABLET(50 MG) BY MOUTH DAILY. NEED FOLLOW UP APPOINTMENT FOR MORE REFILLS, Disp: 30 tablet, Rfl: 0   metoprolol succinate (TOPROL-XL) 50 MG 24 hr tablet, TAKE 1 TABLET(50 MG) BY MOUTH DAILY. NEED FOLLOW UP APPOINTMENT FOR MORE REFILLS, Disp: 30 tablet, Rfl: 0   sacubitril-valsartan (ENTRESTO) 97-103 MG, Take 1 tablet by mouth 2 (two) times daily., Disp: 180 tablet, Rfl: 1   traMADol (ULTRAM) 50 MG tablet, Take 50 mg by mouth every 6 (six) hours as needed. Take 1 tablet (50 MG total by mouth every 6 hours as needed for moderate pain., Disp: , Rfl:    atorvastatin (LIPITOR) 20 MG tablet, Take 1 tablet (20 mg total) by mouth every evening. NEEDS FOLLOW UP APPOINTMENT FOR MORE REFILLS, Disp: , Rfl:    spironolactone (ALDACTONE) 25 MG tablet, Take 1 tablet (25 mg total) by mouth every evening., Disp: , Rfl:   Consent:   NA  Disposition:   6 months follow-up.  His questions and concerns were addressed to his satisfaction. He voices understanding of the recommendations provided during this encounter.    Signed, Tessa Lerner, DO, [facility]  [facility]  [address] [place], [place] [address]

## 2023-12-08 ENCOUNTER — Encounter: Payer: Self-pay | Admitting: Cardiology

## 2023-12-13 ENCOUNTER — Telehealth: Payer: Self-pay | Admitting: Cardiology

## 2023-12-13 ENCOUNTER — Other Ambulatory Visit (HOSPITAL_COMMUNITY): Payer: Self-pay | Admitting: Internal Medicine

## 2023-12-13 MED ORDER — DAPAGLIFLOZIN PROPANEDIOL 10 MG PO TABS: 10.0000 mg | ORAL_TABLET | Freq: Every day | ORAL | 3 refills | Status: DC

## 2023-12-13 NOTE — Telephone Encounter (Signed)
 Pt's medication was sent to pt's pharmacy as requested. Confirmation received.

## 2023-12-13 NOTE — Telephone Encounter (Signed)
*  STAT* If patient is at the pharmacy, call can be transferred to refill team.   1. Which medications need to be refilled? (please list name of each medication and dose if known) dapagliflozin propanediol (FARXIGA) 10 MG TABS tablet  2. Which pharmacy/location (including street and city if local pharmacy) is medication to be sent to? [id number] - [place],  - [address] AT [address]    3. Do they need a 30 day or 90 day supply?   90 day supply + refills if possible

## 2023-12-14 ENCOUNTER — Other Ambulatory Visit (HOSPITAL_COMMUNITY): Payer: Self-pay | Admitting: Family Medicine

## 2023-12-14 DIAGNOSIS — I2510 Atherosclerotic heart disease of native coronary artery without angina pectoris: Secondary | ICD-10-CM

## 2023-12-22 ENCOUNTER — Ambulatory Visit (INDEPENDENT_AMBULATORY_CARE_PROVIDER_SITE_OTHER): Payer: Self-pay

## 2023-12-22 DIAGNOSIS — I255 Ischemic cardiomyopathy: Secondary | ICD-10-CM

## 2023-12-22 LAB — CUP PACEART REMOTE DEVICE CHECK
Battery Remaining Longevity: 113 mo
Battery Voltage: 3.01 V
Brady Statistic RV Percent Paced: 0.01 %
Date Time Interrogation Session: 20250326022723
HighPow Impedance: 70 Ohm
Implantable Lead Connection Status: 753985
Implantable Lead Implant Date: 20211228
Implantable Lead Location: 753860
Implantable Pulse Generator Implant Date: 20211228
Lead Channel Impedance Value: 399 Ohm
Lead Channel Impedance Value: 494 Ohm
Lead Channel Pacing Threshold Amplitude: 0.5 V
Lead Channel Pacing Threshold Pulse Width: 0.4 ms
Lead Channel Sensing Intrinsic Amplitude: 19.375 mV
Lead Channel Sensing Intrinsic Amplitude: 19.375 mV
Lead Channel Setting Pacing Amplitude: 2 V
Lead Channel Setting Pacing Pulse Width: 0.4 ms
Lead Channel Setting Sensing Sensitivity: 0.3 mV
Zone Setting Status: 755011
Zone Setting Status: 755011

## 2023-12-23 ENCOUNTER — Telehealth (HOSPITAL_COMMUNITY): Payer: Self-pay | Admitting: Cardiology

## 2023-12-23 MED ORDER — METOPROLOL SUCCINATE ER 50 MG PO TB24: 50.0000 mg | ORAL_TABLET | Freq: Every day | ORAL | 2 refills | Status: DC

## 2023-12-23 NOTE — Telephone Encounter (Signed)
 Pt requested refill on metoprolol Overdue for followup F/u scheduled 4/29 Meds refilled

## 2023-12-29 ENCOUNTER — Ambulatory Visit (HOSPITAL_COMMUNITY): Attending: Cardiology

## 2023-12-29 DIAGNOSIS — I2510 Atherosclerotic heart disease of native coronary artery without angina pectoris: Secondary | ICD-10-CM | POA: Diagnosis not present

## 2023-12-29 LAB — ECHOCARDIOGRAM COMPLETE
Area-P 1/2: 5.42 cm2
S' Lateral: 2.8 cm

## 2024-01-11 ENCOUNTER — Other Ambulatory Visit (HOSPITAL_COMMUNITY): Payer: Self-pay

## 2024-01-11 DIAGNOSIS — I5022 Chronic systolic (congestive) heart failure: Secondary | ICD-10-CM

## 2024-01-11 MED ORDER — SPIRONOLACTONE 25 MG PO TABS: 25.0000 mg | ORAL_TABLET | Freq: Every evening | ORAL | 0 refills | Status: DC

## 2024-01-24 ENCOUNTER — Telehealth (HOSPITAL_COMMUNITY): Payer: Self-pay | Admitting: Internal Medicine

## 2024-01-24 NOTE — Telephone Encounter (Signed)
 Called to confirm/remind patient of their appointment at the Advanced [facility] Failure Clinic on 01/24/24.   Appointment:   [x] Confirmed  [] Left mess   [] No answer/No voice mail  [] VM Full/unable to leave message  [] Phone not in service  Patient reminded to bring all medications and/or complete list.  Confirmed patient has transportation. Gave directions, instructed to utilize valet parking.

## 2024-01-25 ENCOUNTER — Ambulatory Visit (HOSPITAL_COMMUNITY)
Admission: RE | Admit: 2024-01-25 | Discharge: 2024-01-25 | Disposition: A | Discharge status: Home / Self Care | Source: Ambulatory Visit | Attending: Internal Medicine | Admitting: Internal Medicine

## 2024-01-25 VITALS — BP 118/80 | HR 73 | Ht 72.0 in | Wt 225.0 lb

## 2024-01-25 DIAGNOSIS — Z95810 Presence of automatic (implantable) cardiac defibrillator: Secondary | ICD-10-CM | POA: Diagnosis not present

## 2024-01-25 DIAGNOSIS — I5022 Chronic systolic (congestive) heart failure: Secondary | ICD-10-CM | POA: Diagnosis present

## 2024-01-25 DIAGNOSIS — F1090 Alcohol use, unspecified, uncomplicated: Secondary | ICD-10-CM

## 2024-01-25 DIAGNOSIS — I484 Atypical atrial flutter: Secondary | ICD-10-CM

## 2024-01-25 DIAGNOSIS — I2510 Atherosclerotic heart disease of native coronary artery without angina pectoris: Secondary | ICD-10-CM | POA: Diagnosis not present

## 2024-01-25 NOTE — Patient Instructions (Signed)
 Great to see you today!!!  CONGRATULATIONS!!! You have graduated the Advanced Heart Failure Clinic, please follow-up with Dr Albert Huff (general cardiology) in 6 months (October 2026)  If you have any questions or concerns before your next appointment please send us  a message through [employer] or call our office at [phone].    TO LEAVE A MESSAGE FOR THE NURSE SELECT OPTION 2, PLEASE LEAVE A MESSAGE INCLUDING: YOUR NAME DATE OF BIRTH CALL BACK NUMBER REASON FOR CALL**this is important as we prioritize the call backs  YOU WILL RECEIVE A CALL BACK THE SAME DAY AS LONG AS YOU CALL BEFORE 4:00 PM

## 2024-01-25 NOTE — Progress Notes (Addendum)
 Advanced Heart Failure Clinic Note   PCP: Dr. Concetta Dee EP: Dr. Lawana Pray HF Cardiologist:  Dr. Julane Ny   HPI:  Corey Brady is a 68 y.o. male w/ CAD, severe systolic HF and PAFL. Underwent CABG 5/21. Post-op course c/b low output and PAFL requiring several DC-CV. Was placed on amiodarone .      He was loaded on IV amiodarone , started on IV lasix  for CHF and started on IV vanc for cellulitis. EP was consulted for AFL and he underwent  AFL ablation 03/16/20. Post ablation had periods of junctional rhythm. Dig and amio held and junctional rhythm resolved.  He had EP f/u w/ Dr. Lawana Pray 04/25/20 and was in NSR. Eliquis  was discontinued and low dose metoprolol  was added. He has done well since and has been followed closely and meds optimized.   He underwent ICD implant on 09/24/20  Echo 4/25: EF 50-55% G1DD RV mildly reduced  Today he returns for HF follow up. Last seen in 2023. Feels good going to [employer] regularly. No CP or SOB. No edema, orthopnea or PND. No ICD firing   Cardiac Studies: - Echo (4/21) EF 15% with severe global HK, and moderate RV dysfunction.  Etiology possible ETOH versus HTN (denies severe ETOH use). TSH ok. HIV NR  - LHC (4/21) w/ severe 1v CAD. RHC with preserved CO. - CABG (5/21) x 1 w/ LIMA-LAD.  - Echo (6/21): EF 25-30%, RV mildly reduced.    Past Medical History:  Diagnosis Date   CHF (congestive heart failure) (HCC)    Gout    No blood products 01/27/2020    Current Outpatient Medications  Medication Sig Dispense Refill   aspirin  81 MG chewable tablet Chew 1 tablet (81 mg total) by mouth daily.     atorvastatin  (LIPITOR) 20 MG tablet TAKE 1 TABLET(20 MG) BY MOUTH DAILY. NEED FOLLOW UP APPOINTMENT FOR MORE REFILLS 30 tablet 0   dapagliflozin  propanediol (FARXIGA ) 10 MG TABS tablet Take 1 tablet (10 mg total) by mouth daily. 90 tablet 3   metoprolol  succinate (TOPROL -XL) 50 MG 24 hr tablet Take 1 tablet (50 mg total) by mouth daily. Take with or  immediately following a meal. 30 tablet 2   sacubitril -valsartan  (ENTRESTO ) 97-103 MG Take 1 tablet by mouth 2 (two) times daily. 180 tablet 1   spironolactone  (ALDACTONE ) 25 MG tablet Take 1 tablet (25 mg total) by mouth every evening. 30 tablet 0   traMADol  (ULTRAM ) 50 MG tablet Take 50 mg by mouth every 6 (six) hours as needed. Take 1 tablet (50 MG total by mouth every 6 hours as needed for moderate pain. (Patient not taking: Reported on 01/25/2024)     No current facility-administered medications for this encounter.    Allergies  Allergen Reactions   Other Other (See Comments)    No blood products      Social History   Socioeconomic History   Marital status: Single    Spouse name: Not on file   Number of children: Not on file   Years of education: Not on file   Highest education level: Not on file  Occupational History   Not on file  Tobacco Use   Smoking status: Never   Smokeless tobacco: Never  Substance and Sexual Activity   Alcohol use: Yes    Comment: has 1-2 drinks every 2-3 days   Drug use: Not on file   Sexual activity: Not on file  Other Topics Concern   Not on file  Social  History Narrative   Not on file   Social Drivers of Health   Financial Resource Strain: Low Risk  (11/12/2023)   Overall Financial Resource Strain (CARDIA)    Difficulty of Paying Living Expenses: Not very hard  Food Insecurity: No Food Insecurity (11/12/2023)   Hunger Vital Sign    Worried About Running Out of Food in the Last Year: Never true    Ran Out of Food in the Last Year: Never true  Transportation Needs: No Transportation Needs (11/12/2023)   PRAPARE - Administrator, Civil Service (Medical): No    Lack of Transportation (Non-Medical): No  Physical Activity: Sufficiently Active (11/12/2023)   Exercise Vital Sign    Days of Exercise per Week: 4 days    Minutes of Exercise per Session: 90 min  Stress: No Stress Concern Present (11/12/2023)   [employer] of  Occupational Health - Occupational Stress Questionnaire    Feeling of Stress : Not at all  Social Connections: Moderately Integrated (11/12/2023)   Social Connection and Isolation Panel [NHANES]    Frequency of Communication with Friends and Family: Three times a week    Frequency of Social Gatherings with Friends and Family: More than three times a week    Attends Religious Services: More than 4 times per year    Active Member of [employer] or Organizations: Yes    Attends Banker Meetings: 1 to 4 times per year    Marital Status: Never married  Intimate Partner Violence: Not At Risk (11/12/2023)   Humiliation, Afraid, Rape, and Kick questionnaire    Fear of Current or Ex-Partner: No    Emotionally Abused: No    Physically Abused: No    Sexually Abused: No   Family History  Problem Relation Age of Onset   Gout Sister    BP 118/80   Pulse 73   Ht 6' (1.829 m)   Wt 102.1 kg (225 lb)   SpO2 97%   BMI 30.52 kg/m   Wt Readings from Last 3 Encounters:  01/25/24 102.1 kg (225 lb)  11/29/23 102.8 kg (226 lb 9.6 oz)  11/12/23 103.9 kg (229 lb)   PHYSICAL EXAM: General:  Well appearing. No resp difficulty HEENT: normal Neck: supple. no JVD. Carotids 2+ bilat; no bruits. No lymphadenopathy or thryomegaly appreciated. Cor: PMI nondisplaced. Regular rate & rhythm. No rubs, gallops or murmurs. Lungs: clear Abdomen: soft, nontender, nondistended. No hepatosplenomegaly. No bruits or masses. Good bowel sounds. Extremities: no cyanosis, clubbing, rash, edema Neuro: alert & orientedx3, cranial nerves grossly intact. moves all 4 extremities w/o difficulty. Affect pleasant   ICD interrogation: No VT/AF. Fluid ok. Activity level ~3h/day Personally reviewed   ASSESSMENT & PLAN: 1. Chronic Systolic Heart Failure with recovered EF - Echo (4/21) EF 15% with severe global HK, and moderate RV dysfunction.   - Etiology possible ETOH versus HTN (denies severe ETOH use). TSH ok. HIV NR  -  LHC (4/28) w/ severe 1v CAD. RHC with preserved CO. - s/p CABG (5/21) x 1 w/ LIMA-LAD.  - Echo (6/21): EF 25-30%, RV mildly reduced.  - Echo (9/21): EF 25-30%  - Echo 4/25: EF 50-55% G1DD RV mildly reduced - s/p ICD (12/21) - EF has recovered. Doing very well. NYHA I. Volume ok - Not requiring loop diuretics - Continue Entresto  97-103 mg bid.  - Continue Farxiga  10 mg daily.  - Continue spiro 25 mg daily.  - Continue Toprol  XL 50 mg daily. - Check labs  2. CAD - LHC (4/21) w/ severe 1V CAD. - S/p CABG x 1 (5/21) w/ LIMA-LAD. - No s/s angina - Continue ASA, statin and beta blocker.  3. H/o Atrial Flutter  - s/p ablation 6/21 by Dr. Lawana Pray. Periods of junctional rhythm post ablation- off amio and dig -> improved.  - Now s/p ICD. - Remains in NSR. Off Eliquis   4. ETOH - Reports he has cut back significantly  Doing well. EF has recovered. On good GDMT. Can graduate HF Clinic and f/u with primary cardiology team.   Jules Oar, MD 01/25/24

## 2024-02-03 NOTE — Progress Notes (Signed)
 Remote ICD transmission.

## 2024-02-06 ENCOUNTER — Other Ambulatory Visit (HOSPITAL_COMMUNITY): Payer: Self-pay | Admitting: Internal Medicine

## 2024-02-06 DIAGNOSIS — I5022 Chronic systolic (congestive) heart failure: Secondary | ICD-10-CM

## 2024-02-10 ENCOUNTER — Encounter: Payer: Self-pay | Admitting: Internal Medicine

## 2024-03-13 ENCOUNTER — Ambulatory Visit: Payer: Medicare Other | Attending: Internal Medicine | Admitting: Internal Medicine

## 2024-03-13 ENCOUNTER — Encounter: Payer: Self-pay | Admitting: Internal Medicine

## 2024-03-13 ENCOUNTER — Other Ambulatory Visit: Payer: Self-pay

## 2024-03-13 DIAGNOSIS — Z951 Presence of aortocoronary bypass graft: Secondary | ICD-10-CM | POA: Insufficient documentation

## 2024-03-13 DIAGNOSIS — Z79899 Other long term (current) drug therapy: Secondary | ICD-10-CM | POA: Insufficient documentation

## 2024-03-13 DIAGNOSIS — I2510 Atherosclerotic heart disease of native coronary artery without angina pectoris: Secondary | ICD-10-CM | POA: Diagnosis not present

## 2024-03-13 DIAGNOSIS — Z7982 Long term (current) use of aspirin: Secondary | ICD-10-CM | POA: Insufficient documentation

## 2024-03-13 DIAGNOSIS — I110 Hypertensive heart disease with heart failure: Secondary | ICD-10-CM | POA: Diagnosis present

## 2024-03-13 DIAGNOSIS — I484 Atypical atrial flutter: Secondary | ICD-10-CM | POA: Diagnosis not present

## 2024-03-13 DIAGNOSIS — I5022 Chronic systolic (congestive) heart failure: Secondary | ICD-10-CM | POA: Insufficient documentation

## 2024-03-13 DIAGNOSIS — I10 Essential (primary) hypertension: Secondary | ICD-10-CM | POA: Diagnosis not present

## 2024-03-13 DIAGNOSIS — E785 Hyperlipidemia, unspecified: Secondary | ICD-10-CM | POA: Insufficient documentation

## 2024-03-13 DIAGNOSIS — Z95810 Presence of automatic (implantable) cardiac defibrillator: Secondary | ICD-10-CM | POA: Insufficient documentation

## 2024-03-13 DIAGNOSIS — Z23 Encounter for immunization: Secondary | ICD-10-CM

## 2024-03-13 MED ORDER — ZOSTER VAC RECOMB ADJUVANTED 50 MCG/0.5ML IM SUSR
0.5000 mL | Freq: Once | INTRAMUSCULAR | 0 refills | Status: AC
  Filled 2024-03-13: qty 1, 1d supply, fill #0

## 2024-03-13 NOTE — Progress Notes (Signed)
 Patient ID: Corey Brady, male    DOB: Jan 28, 1956  MRN: [id number]  CC: Hypertension (HTN f/u. /No questions / concerns/Yes to shingles vax)   Subjective: Corey Brady is a 68 y.o. male who presents for chronic ds management. His concerns today include:  CAD s/p CABG 12/2019, chronic systolic CHF, HL, hx of atrial flutter/fib (s/p ablation 02/2020.  Experienced junctional rhythm post ablation,. ICD 08/2020  venous stasis LE BL,     Discussed the use of AI scribe software for clinical note transcription with the patient, who gave verbal consent to proceed.  History of Present Illness He is a 68 year old male with systolic congestive heart failure who presents for routine follow-up.  CAD/HTN/CHFrEF/ICD: He has an ICD in place and his last cardiology visit was in April 2025 with no changes to his medication regimen. He is taking Entresto  97/103 mg twice daily, spironolactone  25 mg daily, and Toprol  50 mg daily. He has run out of Farxiga  and plans to refill it. He experiences no chest pain, shortness of breath, leg swelling, dizziness, or any firing of his defibrillator device. He limits salt intake and consumes fruits and vegetables daily. He eats low-sodium canned foods and drinks water, diet soda, and occasionally beer, limiting to no more than two beers every two to three weeks. He engages in light weightlifting at home two to three times a week, uses an exercise bike, and mows his lawn for additional exercise. He remains active when shopping and running errands.    Patient Active Problem List   Diagnosis Date Noted   Atypical atrial flutter (HCC) 10/19/2022   Venous stasis of both lower extremities 05/24/2020   History of atrial flutter 05/24/2020   S/P CABG (coronary artery bypass graft) 01/30/2020     Current Outpatient Medications on File Prior to Visit  Medication Sig Dispense Refill   aspirin  81 MG chewable tablet Chew 1 tablet (81 mg total) by mouth daily.     atorvastatin   (LIPITOR) 20 MG tablet TAKE 1 TABLET(20 MG) BY MOUTH DAILY. NEED FOLLOW UP APPOINTMENT FOR MORE REFILLS 30 tablet 0   dapagliflozin  propanediol (FARXIGA ) 10 MG TABS tablet Take 1 tablet (10 mg total) by mouth daily. 90 tablet 3   metoprolol  succinate (TOPROL -XL) 50 MG 24 hr tablet Take 1 tablet (50 mg total) by mouth daily. Take with or immediately following a meal. 30 tablet 2   sacubitril -valsartan  (ENTRESTO ) 97-103 MG Take 1 tablet by mouth 2 (two) times daily. 180 tablet 1   spironolactone  (ALDACTONE ) 25 MG tablet TAKE 1 TABLET(25 MG) BY MOUTH EVERY EVENING 90 tablet 1   traMADol  (ULTRAM ) 50 MG tablet Take 50 mg by mouth every 6 (six) hours as needed. Take 1 tablet (50 MG total by mouth every 6 hours as needed for moderate pain. (Patient not taking: Reported on 03/13/2024)     No current facility-administered medications on file prior to visit.    Allergies  Allergen Reactions   Other Other (See Comments)    No blood products    Social History   Socioeconomic History   Marital status: Single    Spouse name: Not on file   Number of children: Not on file   Years of education: Not on file   Highest education level: Not on file  Occupational History   Not on file  Tobacco Use   Smoking status: Never   Smokeless tobacco: Never  Substance and Sexual Activity   Alcohol use: Yes  Comment: has 1-2 drinks every 2-3 days   Drug use: Not on file   Sexual activity: Not on file  Other Topics Concern   Not on file  Social History Narrative   Not on file   Social Drivers of Health   Financial Resource Strain: Low Risk  (11/12/2023)   Overall Financial Resource Strain (CARDIA)    Difficulty of Paying Living Expenses: Not very hard  Food Insecurity: No Food Insecurity (11/12/2023)   Hunger Vital Sign    Worried About Running Out of Food in the Last Year: Never true    Ran Out of Food in the Last Year: Never true  Transportation Needs: No Transportation Needs (11/12/2023)   PRAPARE -  Administrator, Civil Service (Medical): No    Lack of Transportation (Non-Medical): No  Physical Activity: Sufficiently Active (11/12/2023)   Exercise Vital Sign    Days of Exercise per Week: 4 days    Minutes of Exercise per Session: 90 min  Stress: No Stress Concern Present (11/12/2023)   [employer] of Occupational Health - Occupational Stress Questionnaire    Feeling of Stress : Not at all  Social Connections: Moderately Integrated (11/12/2023)   Social Connection and Isolation Panel    Frequency of Communication with Friends and Family: Three times a week    Frequency of Social Gatherings with Friends and Family: More than three times a week    Attends Religious Services: More than 4 times per year    Active Member of Clubs or Organizations: Yes    Attends Banker Meetings: 1 to 4 times per year    Marital Status: Never married  Intimate Partner Violence: Not At Risk (11/12/2023)   Humiliation, Afraid, Rape, and Kick questionnaire    Fear of Current or Ex-Partner: No    Emotionally Abused: No    Physically Abused: No    Sexually Abused: No    Family History  Problem Relation Age of Onset   Gout Sister     Past Surgical History:  Procedure Laterality Date   A-FLUTTER ABLATION N/A 03/15/2020   Procedure: A-FLUTTER ABLATION;  Surgeon: Lei Pump, MD;  Location: [facility] IN[facility]IVE [facility] LAB;  Service: Cardiovascular;  Laterality: N/A;   CARDIOVERSION N/A 02/06/2020   Procedure: CARDIOVERSION;  Surgeon: Mardell Shade, MD;  Location: [facility] ENDOSCOPY;  Service: Cardiovascular;  Laterality: N/A;   CORONARY ARTERY BYPASS GRAFT N/A 01/30/2020   Procedure: OFF PUMP CORONARY ARTERY BYPASS GRAFTING (CABG) TIMES ONE;  Surgeon: Hilarie Lovely, MD;  Location: [facility] OR;  Service: Open Heart Surgery;  Laterality: N/A;  swan only   ICD IMPLANT N/A 09/24/2020   Procedure: ICD IMPLANT;  Surgeon: Lei Pump, MD;  Location: [facility] INVASIVE CV LAB;  Service:  Cardiovascular;  Laterality: N/A;   RIGHT/LEFT HEART CATH AND CORONARY ANGIOGRAPHY N/A 01/26/2020   Procedure: RIGHT/LEFT HEART CATH AND CORONARY ANGIOGRAPHY;  Surgeon: Mardell Shade, MD;  Location: [facility] INVAS[facility] LAB;  Service: Cardiovascular;  Laterality: N/A;   TEE WITHOUT CARDIOVERSION N/A 01/30/2020   Procedure: TRANSESOPHAGEAL ECHOCARDIOGRAM (TEE);  Surgeon: Hilarie Lovely, MD;  Location: [facility] OR;  Service: Open Heart Surgery;  Laterality: N/A;   TEE WITHOUT CARDIOVERSION N/A 02/06/2020   Procedure: TRANSESOPHAGEAL ECHOCARDIOGRAM (TEE);  Surgeon: Mardell Shade, MD;  Location: [facility] ENDOSCOPY;  Service: Cardiovascular;  Laterality: N/A;    ROS: Review of Systems Negative except as stated above  PHYSICAL EXAM: BP 100/76   Pulse 81  Temp 98.2 F (36.8 C) (Oral)   Ht 6' (1.829 m)   Wt 226 lb (102.5 kg)   SpO2 96%   BMI 30.65 kg/m   Wt Readings from Last 3 Encounters:  03/13/24 226 lb (102.5 kg)  01/25/24 225 lb (102.1 kg)  11/29/23 226 lb 9.6 oz (102.8 kg)    Physical Exam   General appearance - alert, well appearing, and in no distress Mental status - normal mood, behavior, speech, dress, motor activity, and thought processes Chest - clear to auscultation, no wheezes, rales or rhonchi, symmetric air entry Heart - normal rate, regular rhythm, normal S1, S2, no murmurs, rubs, clicks or gallops Extremities - peripheral pulses normal, no pedal edema, no clubbing or cyanosis     Latest Ref Rng & Units 11/12/2023    4:46 PM 07/12/2023    2:11 PM 10/19/2022    3:46 PM  CMP  Glucose 70 - 99 mg/dL 66  191    BUN 8 - 27 mg/dL 16  14    Creatinine 4.78 - 1.27 mg/dL 2.95  6.21    Sodium 308 - 144 mmol/L 141  139    Potassium 3.5 - 5.2 mmol/L 5.2  5.1    Chloride 96 - 106 mmol/L 104  102    CO2 20 - 29 mmol/L 25  21    Calcium  8.6 - 10.2 mg/dL 9.6  9.2    Total Protein 6.0 - 8.5 g/dL 6.9   7.0   Total Bilirubin 0.0 - 1.2 mg/dL 0.6   0.6   Alkaline Phos 44 - 121  IU/L 72   83   AST 0 - 40 IU/L 27   28   ALT 0 - 44 IU/L 24   37    Lipid Panel     Component Value Date/Time   CHOL 117 11/12/2023 1646   TRIG 128 11/12/2023 1646   HDL 45 11/12/2023 1646   CHOLHDL 2.6 11/12/2023 1646   CHOLHDL 3.1 02/12/2021 1046   VLDL 28 02/12/2021 1046   LDLCALC 49 11/12/2023 1646    CBC    Component Value Date/Time   WBC 7.2 11/12/2023 1646   WBC 7.0 03/06/2021 1422   RBC 5.18 11/12/2023 1646   RBC 5.16 03/06/2021 1422   HGB 16.1 11/12/2023 1646   HCT 47.7 11/12/2023 1646   PLT 205 11/12/2023 1646   MCV 92 11/12/2023 1646   MCH 31.1 11/12/2023 1646   MCH 31.4 03/06/2021 1422   MCHC 33.8 11/12/2023 1646   MCHC 33.4 03/06/2021 1422   RDW 12.8 11/12/2023 1646   LYMPHSABS 1.7 03/06/2021 1422   MONOABS 0.6 03/06/2021 1422   EOSABS 0.1 03/06/2021 1422   BASOSABS 0.1 03/06/2021 1422    ASSESSMENT AND PLAN: 1. Essential hypertension At goal. Continue Entresto  97/103 mg twice a day, Toprol  50 mg daily, spironolactone    2. Chronic systolic congestive heart failure (HCC) Stable and compensated. Continue Entresto , spironolactone , Toprol  50 mg daily, Farxiga    3. Coronary artery disease involving native coronary artery of native heart without angina pectoris Stable. Continue Toprol  50 mg daily, atorvastatin  and aspirin    4. Need for shingles vaccine Due for second Shingrix  vaccine.  Printed prescription given to him to take to pharmacy. - Zoster Vaccine Adjuvanted (SHINGRIX ) injection; Inject 0.5 mLs into the muscle once for 1 dose.  Dispense: 0.5 mL; Refill: 0  Patient was given the opportunity to ask questions.  Patient verbalized understanding of the plan and was able to repeat  key elements of the plan.   This documentation was completed using Paediatric nurse.  Any transcriptional errors are unintentional.  No orders of the defined types were placed in this encounter.    Requested Prescriptions   Pending Prescriptions Disp  Refills   Zoster Vaccine Adjuvanted (SHINGRIX ) injection 0.5 mL 0    Sig: Inject 0.5 mLs into the muscle once for 1 dose.    No follow-ups on file.  Concetta Dee, MD, FACP

## 2024-03-22 ENCOUNTER — Ambulatory Visit (INDEPENDENT_AMBULATORY_CARE_PROVIDER_SITE_OTHER): Payer: Self-pay

## 2024-03-22 DIAGNOSIS — I255 Ischemic cardiomyopathy: Secondary | ICD-10-CM | POA: Diagnosis not present

## 2024-03-23 LAB — CUP PACEART REMOTE DEVICE CHECK
Battery Remaining Longevity: 109 mo
Battery Voltage: 3.01 V
Brady Statistic RV Percent Paced: 0.01 %
Date Time Interrogation Session: 20250625062853
HighPow Impedance: 64 Ohm
Implantable Lead Connection Status: 753985
Implantable Lead Implant Date: 20211228
Implantable Lead Location: 753860
Implantable Pulse Generator Implant Date: 20211228
Lead Channel Impedance Value: 380 Ohm
Lead Channel Impedance Value: 456 Ohm
Lead Channel Pacing Threshold Amplitude: 0.5 V
Lead Channel Pacing Threshold Pulse Width: 0.4 ms
Lead Channel Sensing Intrinsic Amplitude: 19.25 mV
Lead Channel Sensing Intrinsic Amplitude: 19.25 mV
Lead Channel Setting Pacing Amplitude: 2 V
Lead Channel Setting Pacing Pulse Width: 0.4 ms
Lead Channel Setting Sensing Sensitivity: 0.3 mV
Zone Setting Status: 755011
Zone Setting Status: 755011

## 2024-03-24 ENCOUNTER — Ambulatory Visit (INDEPENDENT_AMBULATORY_CARE_PROVIDER_SITE_OTHER): Payer: Self-pay

## 2024-03-24 DIAGNOSIS — I255 Ischemic cardiomyopathy: Secondary | ICD-10-CM | POA: Diagnosis not present

## 2024-03-26 ENCOUNTER — Ambulatory Visit: Payer: Self-pay | Admitting: Cardiology

## 2024-04-10 ENCOUNTER — Other Ambulatory Visit: Payer: Self-pay | Admitting: Internal Medicine

## 2024-04-10 ENCOUNTER — Other Ambulatory Visit: Payer: Self-pay

## 2024-04-10 DIAGNOSIS — I5022 Chronic systolic (congestive) heart failure: Secondary | ICD-10-CM

## 2024-04-18 ENCOUNTER — Ambulatory Visit

## 2024-04-18 ENCOUNTER — Other Ambulatory Visit (HOSPITAL_COMMUNITY): Payer: Self-pay

## 2024-04-18 MED ORDER — METOPROLOL SUCCINATE ER 50 MG PO TB24: 50.0000 mg | ORAL_TABLET | Freq: Every day | ORAL | 2 refills | Status: DC

## 2024-06-14 NOTE — Progress Notes (Signed)
Remote ICD Transmission.

## 2024-06-14 NOTE — Addendum Note (Signed)
 Addended by: VICCI SELLER A on: 06/14/2024 01:41 PM   Modules accepted: Orders

## 2024-06-23 ENCOUNTER — Ambulatory Visit: Payer: Self-pay

## 2024-06-23 ENCOUNTER — Ambulatory Visit: Payer: Self-pay | Admitting: Cardiology

## 2024-06-23 DIAGNOSIS — I255 Ischemic cardiomyopathy: Secondary | ICD-10-CM

## 2024-06-23 LAB — CUP PACEART REMOTE DEVICE CHECK
Battery Remaining Longevity: 106 mo
Battery Voltage: 3.01 V
Brady Statistic RV Percent Paced: 0.01 %
Date Time Interrogation Session: 20250926042505
HighPow Impedance: 69 Ohm
Implantable Lead Connection Status: 753985
Implantable Lead Implant Date: 20211228
Implantable Lead Location: 753860
Implantable Pulse Generator Implant Date: 20211228
Lead Channel Impedance Value: 399 Ohm
Lead Channel Impedance Value: 494 Ohm
Lead Channel Pacing Threshold Amplitude: 0.5 V
Lead Channel Pacing Threshold Pulse Width: 0.4 ms
Lead Channel Sensing Intrinsic Amplitude: 22 mV
Lead Channel Sensing Intrinsic Amplitude: 22 mV
Lead Channel Setting Pacing Amplitude: 2 V
Lead Channel Setting Pacing Pulse Width: 0.4 ms
Lead Channel Setting Sensing Sensitivity: 0.3 mV
Zone Setting Status: 755011
Zone Setting Status: 755011

## 2024-06-28 NOTE — Progress Notes (Signed)
 Remote ICD Transmission

## 2024-07-03 ENCOUNTER — Other Ambulatory Visit (HOSPITAL_COMMUNITY): Payer: Self-pay

## 2024-07-06 ENCOUNTER — Other Ambulatory Visit: Payer: Self-pay | Admitting: Internal Medicine

## 2024-07-06 DIAGNOSIS — I2510 Atherosclerotic heart disease of native coronary artery without angina pectoris: Secondary | ICD-10-CM

## 2024-07-11 ENCOUNTER — Telehealth: Payer: Self-pay | Admitting: Pharmacy Technician

## 2024-07-11 NOTE — Telephone Encounter (Signed)
 Pharmacist is requesting pt's medication farxiga  be sent into pharmacy as a brand name. Would Dr. Tolia like to prescribe this medication as a brand name? Please address

## 2024-07-11 NOTE — Telephone Encounter (Signed)
 Forwarding to provider.

## 2024-07-11 NOTE — Telephone Encounter (Signed)
 Hi, ins/pharmacy is asking for the farxiga  to be sent in as brand written since they do not pay for generic. Thank you

## 2024-07-11 NOTE — Addendum Note (Signed)
 Addended by: BLUFORD RAMP D on: 07/11/2024 02:16 PM   Modules accepted: Orders

## 2024-07-12 MED ORDER — FARXIGA 10 MG PO TABS: 10.0000 mg | ORAL_TABLET | Freq: Every day | ORAL | 5 refills | Status: AC

## 2024-07-12 NOTE — Addendum Note (Signed)
 Addended by: BLUFORD RAMP D on: 07/12/2024 02:36 PM   Modules accepted: Orders

## 2024-07-12 NOTE — Telephone Encounter (Signed)
 Pt's medication was resent to pt's pharmacy as name brand so insurance will pay for medication. Confirmation received.

## 2024-07-12 NOTE — Telephone Encounter (Signed)
 Yes, may refill as Farxiga .   Callum Wolf Hanna, DO, FACC

## 2024-07-25 ENCOUNTER — Other Ambulatory Visit (HOSPITAL_COMMUNITY): Payer: Self-pay | Admitting: Internal Medicine

## 2024-07-25 ENCOUNTER — Other Ambulatory Visit: Payer: Self-pay | Admitting: Internal Medicine

## 2024-07-25 NOTE — Telephone Encounter (Unsigned)
 Copied from CRM [id number]. Topic: Clinical - Medication Refill >> Jul 25, 2024  4:02 PM Montie POUR wrote: Medication:  metoprolol  succinate (TOPROL -XL) 50 MG 24 hr tablet   Has the patient contacted their pharmacy? Yes (Agent: If no, request that the patient contact the pharmacy for the refill. If patient does not wish to contact the pharmacy document the reason why and proceed with request.) (Agent: If yes, when and what did the pharmacy advise?) Pharmacy needs order to refill  This is the patient's preferred pharmacy:  [id number] [address],  - [address] AT [facility] [address] [place] [address] Phone: [phone] Fax: [phone]  Is this the correct pharmacy for this prescription? Yes If no, delete pharmacy and type the correct one.   Has the prescription been filled recently? No  Is the patient out of the medication? Yes - He took his last pill yesterday.   Has the patient been seen for an appointment in the last year OR does the patient have an upcoming appointment? Yes  Can we respond through MyChart? No  Agent: Please be advised that Rx refills may take up to 3 business days. We ask that you follow-up with your pharmacy.

## 2024-07-26 NOTE — Progress Notes (Signed)
 Remote ICD transmission.

## 2024-07-27 NOTE — Telephone Encounter (Signed)
 Already refilled on 07/26/24 in a separate refill encounter.

## 2024-08-01 ENCOUNTER — Other Ambulatory Visit (HOSPITAL_COMMUNITY): Payer: Self-pay | Admitting: Internal Medicine

## 2024-08-01 DIAGNOSIS — I5022 Chronic systolic (congestive) heart failure: Secondary | ICD-10-CM

## 2024-08-14 ENCOUNTER — Encounter: Payer: Self-pay | Admitting: Internal Medicine

## 2024-08-14 ENCOUNTER — Ambulatory Visit: Attending: Internal Medicine | Admitting: Internal Medicine

## 2024-08-14 VITALS — BP 103/70 | HR 68 | Temp 97.8°F | Ht 72.0 in | Wt 226.0 lb

## 2024-08-14 DIAGNOSIS — Z95810 Presence of automatic (implantable) cardiac defibrillator: Secondary | ICD-10-CM | POA: Insufficient documentation

## 2024-08-14 DIAGNOSIS — I110 Hypertensive heart disease with heart failure: Secondary | ICD-10-CM | POA: Insufficient documentation

## 2024-08-14 DIAGNOSIS — I5022 Chronic systolic (congestive) heart failure: Secondary | ICD-10-CM | POA: Insufficient documentation

## 2024-08-14 DIAGNOSIS — I10 Essential (primary) hypertension: Secondary | ICD-10-CM

## 2024-08-14 DIAGNOSIS — I2510 Atherosclerotic heart disease of native coronary artery without angina pectoris: Secondary | ICD-10-CM | POA: Insufficient documentation

## 2024-08-14 DIAGNOSIS — Z23 Encounter for immunization: Secondary | ICD-10-CM | POA: Insufficient documentation

## 2024-08-14 DIAGNOSIS — Z79899 Other long term (current) drug therapy: Secondary | ICD-10-CM | POA: Diagnosis not present

## 2024-08-14 NOTE — Progress Notes (Signed)
 Patient ID: Corey Brady, male    DOB: 1956/04/08  MRN: [id number]  CC: Follow-up (Follow-up. Thompson Entresto  /Flu vax administered on 08/14/2024 - C.A.)   Subjective: Corey Brady is a 68 y.o. male who presents for chronic ds management. His concerns today include:  CAD s/p CABG 12/2019, chronic systolic CHF, HL, hx of atrial flutter/fib (s/p ablation 02/2020.  Experienced junctional rhythm post ablation,. ICD 08/2020  venous stasis LE BL,    Discussed the use of AI scribe software for clinical note transcription with the patient, who gave verbal consent to proceed.  History of Present Illness   Corey Brady is a 68 year old male with hypertension, congestive heart failure, and coronary artery disease who presents for a follow-up visit.  He has not experienced any major issues since his last visit in June. No shortness of breath at rest or with exertion, chest pain, or leg swelling. He has a defibrillator in place and has not received any shocks from it.  His current medications include spironolactone  25 mg daily, Toprol  50 mg daily, Farxiga  10 mg daily, aspirin , atorvastatin  20 mg, and Entresto  97/103 mg. He mentions a switch to a generic brand of Entresto . He has a couple more bottles of Entresto  and does not need refills at the moment.  He sometimes checks his blood pressure at home using a mechanical device and occasionally at [facility], where it runs around 107/70-79. He denies any dizziness.  He engages in regular physical activity, including weightlifting at home three to four times a week and visits to [employer]. He describes his diet as including vegetables, fruits, minimal red meat, and low salt intake, often eating tuna and beans.  He had a PSA level of 2.4 in January 2024.      HM: due for PSA. Wants to wait until next visit  Patient Active Problem List   Diagnosis Date Noted   Atypical atrial flutter (HCC) 10/19/2022   Venous stasis of both lower extremities 05/24/2020    History of atrial flutter 05/24/2020   S/P CABG (coronary artery bypass graft) 01/30/2020     Current Outpatient Medications on File Prior to Visit  Medication Sig Dispense Refill   aspirin  81 MG chewable tablet Chew 1 tablet (81 mg total) by mouth daily.     atorvastatin  (LIPITOR) 20 MG tablet Take 1 tablet (20 mg total) by mouth daily. 90 tablet 1   FARXIGA  10 MG TABS tablet Take 1 tablet (10 mg total) by mouth daily before breakfast. 30 tablet 5   metoprolol  succinate (TOPROL -XL) 50 MG 24 hr tablet TAKE 1 TABLET(50 MG) BY MOUTH DAILY WITH OR IMMEDIATELY FOLLOWING A MEAL 30 tablet 2   sacubitril -valsartan  (ENTRESTO ) 97-103 MG TAKE 1 TABLET BY MOUTH TWICE DAILY 60 tablet 3   spironolactone  (ALDACTONE ) 25 MG tablet TAKE 1 TABLET(25 MG) BY MOUTH EVERY EVENING 90 tablet 1   No current facility-administered medications on file prior to visit.    Allergies  Allergen Reactions   Other Other (See Comments)    No blood products    Social History   Socioeconomic History   Marital status: Single    Spouse name: Not on file   Number of children: Not on file   Years of education: Not on file   Highest education level: Not on file  Occupational History   Not on file  Tobacco Use   Smoking status: Never   Smokeless tobacco: Never  Substance and Sexual Activity   Alcohol  use: Yes    Comment: has 1-2 drinks every 2-3 days   Drug use: Not on file   Sexual activity: Not on file  Other Topics Concern   Not on file  Social History Narrative   Not on file   Social Drivers of Health   Financial Resource Strain: Low Risk  (11/12/2023)   Overall Financial Resource Strain (CARDIA)    Difficulty of Paying Living Expenses: Not very hard  Food Insecurity: No Food Insecurity (11/12/2023)   Hunger Vital Sign    Worried About Running Out of Food in the Last Year: Never true    Ran Out of Food in the Last Year: Never true  Transportation Needs: No Transportation Needs (11/12/2023)   PRAPARE -  Administrator, Civil Service (Medical): No    Lack of Transportation (Non-Medical): No  Physical Activity: Sufficiently Active (11/12/2023)   Exercise Vital Sign    Days of Exercise per Week: 4 days    Minutes of Exercise per Session: 90 min  Stress: No Stress Concern Present (11/12/2023)   [employer] of Occupational Health - Occupational Stress Questionnaire    Feeling of Stress : Not at all  Social Connections: Moderately Integrated (11/12/2023)   Social Connection and Isolation Panel    Frequency of Communication with Friends and Family: Three times a week    Frequency of Social Gatherings with Friends and Family: More than three times a week    Attends Religious Services: More than 4 times per year    Active Member of Clubs or Organizations: Yes    Attends Banker Meetings: 1 to 4 times per year    Marital Status: Never married  Intimate Partner Violence: Not At Risk (11/12/2023)   Humiliation, Afraid, Rape, and Kick questionnaire    Fear of Current or Ex-Partner: No    Emotionally Abused: No    Physically Abused: No    Sexually Abused: No    Family History  Problem Relation Age of Onset   Gout Sister     Past Surgical History:  Procedure Laterality Date   A-FLUTTER ABLATION N/A 03/15/2020   Procedure: A-FLUTTER ABLATION;  Surgeon: Inocencio Soyla Lunger, MD;  Location: [facility] INVASIVE CV LAB;  Service: Cardiovascular;  Laterality: N/A;   CARDIOVERSION N/A 02/06/2020   Procedure: CARDIOVERSION;  Surgeon: Cherrie Toribio SAUNDERS, MD;  Location: [facility] ENDOSCOPY;  Service: Cardiovascular;  Laterality: N/A;   CORONARY ARTERY BYPASS GRAFT N/A 01/30/2020   Procedure: OFF PUMP CORONARY ARTERY BYPASS GRAFTING (CABG) TIMES ONE;  Surgeon: Shyrl Linnie KIDD, MD;  Location: [facility] OR;  Service: Open Heart Surgery;  Laterality: N/A;  swan only   ICD IMPLANT N/A 09/24/2020   Procedure: ICD IMPLANT;  Surgeon: Inocencio Soyla Lunger, MD;  Location[facility] INVASIVE CV LAB;  Service:  Cardiovascular;  Laterality: N/A;   RIGHT/LEFT HEART CATH AND CORONARY ANGIOGRAPHY N/A 01/26/2020   Procedure: RIGHT/LEFT HEART CATH AND CORONARY ANGIOGRAPHY;  Surgeon: Cherrie Toribio SAUNDERS, MD;  Location: [facility] INVASIVE CV LAB;  Service: Cardiovascular;  Laterality: N/A;   TEE WITHOUT CARDIOVERSION N/A 01/30/2020   Procedure: TRANSESOPHAGEAL ECHOCARDIOGRAM (TEE);  Surgeon: Shyrl Linnie KIDD, MD;  Location: [facility] OR;  Service: Open Heart Surgery;  Laterality: N/A;   TEE WITHOUT CARDIOVERSION N/A 02/06/2020   Procedure: TRANSESOPHAGEAL ECHOCARDIOGRAM (TEE);  Surgeon: Cherrie Toribio SAUNDERS, MD;  Location: [facility] ENDOSCOPY;  Service: Cardiovascular;  Laterality: N/A;    ROS: Review of Systems Negative except as stated above  PHYSICAL EXAM: BP 103/70  Pulse 68   Temp 97.8 F (36.6 C) (Oral)   Ht 6' (1.829 m)   Wt 226 lb (102.5 kg)   SpO2 96%   BMI 30.65 kg/m   Wt Readings from Last 3 Encounters:  08/14/24 226 lb (102.5 kg)  03/13/24 226 lb (102.5 kg)  01/25/24 225 lb (102.1 kg)    Physical Exam General appearance - alert, well appearing, older caucasian male and in no distress Mental status - normal mood, behavior, speech, dress, motor activity, and thought processes Eyes - pink conjunctiva Mouth - mucous membranes moist, pharynx normal without lesions Chest - clear to auscultation, no wheezes, rales or rhonchi, symmetric air entry Heart - normal rate, regular rhythm, normal S1, S2, no murmurs, rubs, clicks or gallops Extremities - peripheral pulses normal, no pedal edema, no clubbing or cyanosis     Latest Ref Rng & Units 11/12/2023    4:46 PM 07/12/2023    2:11 PM 10/19/2022    3:46 PM  CMP  Glucose 70 - 99 mg/dL 66  899    BUN 8 - 27 mg/dL 16  14    Creatinine 9.23 - 1.27 mg/dL 8.91  8.96    Sodium 865 - 144 mmol/L 141  139    Potassium 3.5 - 5.2 mmol/L 5.2  5.1    Chloride 96 - 106 mmol/L 104  102    CO2 20 - 29 mmol/L 25  21    Calcium  8.6 - 10.2 mg/dL 9.6  9.2    Total Protein  6.0 - 8.5 g/dL 6.9   7.0   Total Bilirubin 0.0 - 1.2 mg/dL 0.6   0.6   Alkaline Phos 44 - 121 IU/L 72   83   AST 0 - 40 IU/L 27   28   ALT 0 - 44 IU/L 24   37    Lipid Panel     Component Value Date/Time   CHOL 117 11/12/2023 1646   TRIG 128 11/12/2023 1646   HDL 45 11/12/2023 1646   CHOLHDL 2.6 11/12/2023 1646   CHOLHDL 3.1 02/12/2021 1046   VLDL 28 02/12/2021 1046   LDLCALC 49 11/12/2023 1646    CBC    Component Value Date/Time   WBC 7.2 11/12/2023 1646   WBC 7.0 03/06/2021 1422   RBC 5.18 11/12/2023 1646   RBC 5.16 03/06/2021 1422   HGB 16.1 11/12/2023 1646   HCT 47.7 11/12/2023 1646   PLT 205 11/12/2023 1646   MCV 92 11/12/2023 1646   MCH 31.1 11/12/2023 1646   MCH 31.4 03/06/2021 1422   MCHC 33.8 11/12/2023 1646   MCHC 33.4 03/06/2021 1422   RDW 12.8 11/12/2023 1646   LYMPHSABS 1.7 03/06/2021 1422   MONOABS 0.6 03/06/2021 1422   EOSABS 0.1 03/06/2021 1422   BASOSABS 0.1 03/06/2021 1422    ASSESSMENT AND PLAN: 1. Essential hypertension (Primary) At goal. Continue Entresto  97/103 mg twice a day, Toprol  50 mg daily, spironolactone    2. Chronic systolic congestive heart failure (HCC) Stable and compensated. Continue Entresto , spironolactone , Toprol  50 mg daily, Farxiga    3. Coronary artery disease involving native coronary artery of native heart without angina pectoris Stable. Continue Toprol  50 mg daily, atorvastatin  and aspirin    4. Need for influenza vaccination given   Patient was given the opportunity to ask questions.  Patient verbalized understanding of the plan and was able to repeat key elements of the plan.   This documentation was completed using Paediatric nurse.  Any  transcriptional errors are unintentional.  Orders Placed This Encounter  Procedures   Flu vaccine HIGH DOSE PF(Fluzone Trivalent)     Requested Prescriptions    No prescriptions requested or ordered in this encounter    Return in about 4 months (around  12/12/2024).  Barnie Louder, MD, FACP

## 2024-08-14 NOTE — Patient Instructions (Signed)
  VISIT SUMMARY: You had a follow-up visit today to check on your hypertension, congestive heart failure, and coronary artery disease. You reported no major issues since your last visit in June, including no shortness of breath, chest pain, or leg swelling. Your defibrillator has not activated, and your blood pressure readings have been stable. You are maintaining a healthy lifestyle with regular exercise and a balanced diet.  YOUR PLAN: -CHRONIC SYSTOLIC HEART FAILURE AND ATHEROSCLEROTIC HEART DISEASE OF NATIVE CORONARY ARTERY: Chronic systolic heart failure means your heart has difficulty pumping blood efficiently, and atherosclerotic heart disease involves the narrowing of your coronary arteries. Your condition is well-managed with no symptoms. Continue taking your medications as prescribed: spironolactone  25 mg daily, Toprol  50 mg daily, Farxiga  10 mg daily, aspirin , atorvastatin  20 mg daily, and Entresto  97/103 mg daily. Keep up with your regular exercise and healthy diet. Schedule a follow-up with your cardiologist in early spring next year.  -ESSENTIAL HYPERTENSION: Essential hypertension is high blood pressure with no identifiable cause. Your blood pressure is well-controlled, and you have not experienced any dizziness despite low readings. Continue your current antihypertensive medications and monitor your blood pressure regularly at home or at the pharmacy.  INSTRUCTIONS: Please schedule a follow-up appointment with your cardiologist in early spring next year.                      Contains text generated by [employer]bridge.                                 Contains text generated by [employer]bridge.

## 2024-09-12 ENCOUNTER — Ambulatory Visit: Attending: Internal Medicine

## 2024-09-12 DIAGNOSIS — Z0000 Encounter for general adult medical examination without abnormal findings: Secondary | ICD-10-CM

## 2024-09-12 NOTE — Patient Instructions (Signed)
 Corey Brady,  Thank you for taking the time for your Medicare Wellness Visit. I appreciate your continued commitment to your health goals. Please review the care plan we discussed, and feel free to reach out if I can assist you further.  Please note that Annual Wellness Visits do not include a physical exam. Some assessments may be limited, especially if the visit was conducted virtually. If needed, we may recommend an in-person follow-up with your provider.  Ongoing Care Seeing your primary care provider every 3 to 6 months helps us  monitor your health and provide consistent, personalized care. PCP FU 3/7 @230 .  Referrals If a referral was made during today's visit and you haven't received any updates within two weeks, please contact the referred provider directly to check on the status.  Recommended Screenings:  Health Maintenance  Topic Date Due   Medicare Annual Wellness Visit  12/01/2023   COVID-19 Vaccine (3 - 2025-26 season) 05/29/2024   Cologuard (Stool DNA test)  12/27/2025   DTaP/Tdap/Td vaccine (2 - Td or Tdap) 10/19/2032   Pneumococcal Vaccine for age over 67  Completed   Flu Shot  Completed   Hepatitis C Screening  Completed   Zoster (Shingles) Vaccine  Completed   Meningitis B Vaccine  Aged Out       12/01/2022    2:43 PM  Advanced Directives  Does Patient Have a Medical Advance Directive? No  Would patient like information on creating a medical advance directive? No - Patient declined    Vision: Annual vision screenings are recommended for early detection of glaucoma, cataracts, and diabetic retinopathy. These exams can also reveal signs of chronic conditions such as diabetes and high blood pressure.  Dental: Annual dental screenings help detect early signs of oral cancer, gum disease, and other conditions linked to overall health, including heart disease and diabetes.  Please see the attached documents for additional preventive care recommendations.

## 2024-09-12 NOTE — Progress Notes (Signed)
 Chief Complaint  Patient presents with   Medicare Wellness     Subjective:   Corey Brady is a 68 y.o. male who presents for a Medicare Annual Wellness Visit.  Visit info / Clinical Intake: Medicare Wellness Visit Type:: Subsequent Annual Wellness Visit Persons participating in visit and providing information:: patient Medicare Wellness Visit Mode:: Telephone If telephone:: video declined Since this visit was completed virtually, some vitals may be partially provided or unavailable. Missing vitals are due to the limitations of the virtual format.: Unable to obtain vitals - no equipment If Telephone or Video please confirm:: I connected with patient using audio/video enable telemedicine. I verified patient identity with two identifiers, discussed telehealth limitations, and patient agreed to proceed. Patient Location:: home Provider Location:: home Interpreter Needed?: No Pre-visit prep was completed: yes AWV questionnaire completed by patient prior to visit?: no Living arrangements:: with family/others Patient's Overall Health Status Rating: good Typical amount of pain: none Does pain affect daily life?: no Are you currently prescribed opioids?: no  Dietary Habits and Nutritional Risks How many meals a day?: 3 Eats fruit and vegetables daily?: yes Most meals are obtained by: preparing own meals; eating out In the last 2 weeks, have you had any of the following?: none Diabetic:: no  Functional Status Activities of Daily Living (to include ambulation/medication): Independent Ambulation: Independent Medication Administration: Independent Home Management (perform basic housework or laundry): Independent Manage your own finances?: yes Primary transportation is: driving Concerns about vision?: no *vision screening is required for WTM* Concerns about hearing?: no  Fall Screening Falls in the past year?: 0 Number of falls in past year: 0 Was there an injury with Fall?: 0 Fall  Risk Category Calculator: 0 Patient Fall Risk Level: Low Fall Risk  Fall Risk Patient at Risk for Falls Due to: No Fall Risks Fall risk Follow up: Falls evaluation completed; Education provided; Falls prevention discussed  Home and Transportation Safety: All rugs have non-skid backing?: N/A, no rugs All stairs or steps have railings?: N/A, no stairs Grab bars in the bathtub or shower?: (!) no Have non-skid surface in bathtub or shower?: yes Good home lighting?: yes Regular seat belt use?: yes Hospital stays in the last year:: no  Cognitive Assessment Difficulty concentrating, remembering, or making decisions? : no Will 6CIT or Mini Cog be Completed: yes What year is it?: 0 points Give patient an address phrase to remember (5 components): [address] [facility] [place] About what time is it?: 0 points Count backwards from 20 to 1: 0 points Say the months of the year in reverse: 0 points Repeat the address phrase from earlier: 10 points    Allergies (verified) Other   Current Medications (verified) Outpatient Encounter Medications as of 09/12/2024  Medication Sig   aspirin  81 MG chewable tablet Chew 1 tablet (81 mg total) by mouth daily.   atorvastatin  (LIPITOR) 20 MG tablet Take 1 tablet (20 mg total) by mouth daily.   FARXIGA  10 MG TABS tablet Take 1 tablet (10 mg total) by mouth daily before breakfast.   metoprolol  succinate (TOPROL -XL) 50 MG 24 hr tablet TAKE 1 TABLET(50 MG) BY MOUTH DAILY WITH OR IMMEDIATELY FOLLOWING A MEAL   sacubitril -valsartan  (ENTRESTO ) 97-103 MG TAKE 1 TABLET BY MOUTH TWICE DAILY   spironolactone  (ALDACTONE ) 25 MG tablet TAKE 1 TABLET(25 MG) BY MOUTH EVERY EVENING   No facility-administered encounter medications on file as of 09/12/2024.    History: Past Medical History:  Diagnosis Date   CHF (  congestive heart failure) (HCC)    Gout    No blood products 01/27/2020   Past Surgical History:  Procedure Laterality Date   A-FLUTTER ABLATION N/A  03/15/2020   Procedure: A-FLUTTER ABLATION;  Surgeon: Inocencio Soyla Lunger, MD;  Location: [facility] INVASIVE CV LAB;  Service: Cardiovascular;  Laterality: N/A;   CARDIOVERSION N/A 02/06/2020   Procedure: CARDIOVERSION;  Surgeon: Cherrie Toribio SAUNDERS, MD;  Location: [facility] ENDOSCOPY;  Service: Cardiovascular;  Laterality: N/A;   CORONARY ARTERY BYPASS GRAFT N/A 01/30/2020   Procedure: OFF PUMP CORONARY ARTERY BYPASS GRAFTING (CABG) TIMES ONE;  Surgeon: Shyrl Linnie KIDD, MD;  Location: [facility] OR;  Service: Open Heart Surgery;  Laterality: N/A;  swan only   ICD IMPLANT N/A 09/24/2020   Procedure: ICD IMPLANT;  Surgeon: Inocencio Soyla Lunger, MD;  Location: [facility] INVASIVE CV LAB;  Service: Cardiovascular;  Laterality: N/A;   RIGHT/LEFT HEART CATH AND CORONARY ANGIOGRAPHY N/A 01/26/2020   Procedure: RIGHT/LEFT HEART CATH AND CORONARY ANGIOGRAPHY;  Surgeon: Cherrie Toribio SAUNDERS, MD;  Location: [facility] INVASIVE CV LAB;  Service: Cardiovascular;  Laterality: N/A;   TEE WITHOUT CARDIOVERSION N/A 01/30/2020   Procedure: TRANSESOPHAGEAL ECHOCARDIOGRAM (TEE);  Surgeon: Shyrl Linnie KIDD, MD;  Location: [facility] OR;  Service: Open Heart Surgery;  Laterality: N/A;   TEE WITHOUT CARDIOVERSION N/A 02/06/2020   Procedure: TRANSESOPHAGEAL ECHOCARDIOGRAM (TEE);  Surgeon: Cherrie Toribio SAUNDERS, MD;  Location: [facility] ENDOSCOPY;  Service: Cardiovascular;  Laterality: N/A;   Family History  Problem Relation Age of Onset   Gout Sister    Social History   Occupational History   Not on file  Tobacco Use   Smoking status: Never   Smokeless tobacco: Never  Substance and Sexual Activity   Alcohol use: Yes    Comment: has 1-2 drinks every 2-3 days   Drug use: Not on file   Sexual activity: Not on file   Tobacco Counseling Counseling given: Not Answered  SDOH Screenings   Food Insecurity: No Food Insecurity (09/12/2024)  Housing: Low Risk (09/12/2024)  Transportation Needs: No Transportation Needs (09/12/2024)  Utilities: Not At Risk  (09/12/2024)  Alcohol Screen: Low Risk (11/12/2023)  Depression (PHQ2-9): Low Risk (09/12/2024)  Financial Resource Strain: Low Risk (11/12/2023)  Physical Activity: Sufficiently Active (09/12/2024)  Social Connections: Socially Integrated (09/12/2024)  Stress: No Stress Concern Present (09/12/2024)  Tobacco Use: Low Risk (08/14/2024)  Health Literacy: Adequate Health Literacy (09/12/2024)   See flowsheets for full screening details  Depression Screen PHQ 2 & 9 Depression Scale- Over the past 2 weeks, how often have you been bothered by any of the following problems? Little interest or pleasure in doing things: 0 Feeling down, depressed, or hopeless (PHQ Adolescent also includes...irritable): 0 PHQ-2 Total Score: 0 Trouble falling or staying asleep, or sleeping too much: 1 Feeling tired or having little energy: 0 Poor appetite or overeating (PHQ Adolescent also includes...weight loss): 0 Feeling bad about yourself - or that you are a failure or have let yourself or your family down: 0 Trouble concentrating on things, such as reading the newspaper or watching television (PHQ Adolescent also includes...like school work): 0 Moving or speaking so slowly that other people could have noticed. Or the opposite - being so fidgety or restless that you have been moving around a lot more than usual: 0 Thoughts that you would be better off dead, or of hurting yourself in some way: 0 PHQ-9 Total Score: 1 If you checked off any problems, how difficult have these problems made it for you to  do your work, take care of things at home, or get along with other people?: Not difficult at all     Goals Addressed             This Visit's Progress    Watch diet and increasing physical exercise. uses weights and gym.               Objective:    There were no vitals filed for this visit. There is no height or weight on file to calculate BMI.  Hearing/Vision screen No results found. Immunizations  and Health Maintenance Health Maintenance  Topic Date Due   Medicare Annual Wellness (AWV)  12/01/2023   COVID-19 Vaccine (3 - 2025-26 season) 05/29/2024   Fecal DNA (Cologuard)  12/27/2025   DTaP/Tdap/Td (2 - Td or Tdap) 10/19/2032   Pneumococcal Vaccine: 50+ Years  Completed   Influenza Vaccine  Completed   Hepatitis C Screening  Completed   Zoster Vaccines- Shingrix   Completed   Meningococcal B Vaccine  Aged Out        Assessment/Plan:  This is a routine wellness examination for Damire.  Patient Care Team: Vicci Barnie NOVAK, MD as PCP - General (Internal Medicine) Bensimhon, Toribio SAUNDERS, MD as PCP - Advanced Heart Failure (Cardiology) Inocencio Soyla Lunger, MD as PCP - Electrophysiology (Cardiology) Michele Richardson, DO as PCP - Cardiology (Cardiology)  I have personally reviewed and noted the following in the patients chart:   Medical and social history Use of alcohol, tobacco or illicit drugs  Current medications and supplements including opioid prescriptions. Functional ability and status Nutritional status Physical activity Advanced directives List of other physicians Hospitalizations, surgeries, and ER visits in previous 12 months Vitals Screenings to include cognitive, depression, and falls Referrals and appointments  No orders of the defined types were placed in this encounter.  In addition, I have reviewed and discussed with patient certain preventive protocols, quality metrics, and best practice recommendations. A written personalized care plan for preventive services as well as general preventive health recommendations were provided to patient.   Arnette LOISE Hoots, CMA   09/12/2024   No follow-ups on file.  After Visit Summary: (Declined) Due to this being a telephonic visit, with patients personalized plan was offered to patient but patient Declined AVS at this time   Nurse Notes: Patient is doing well. He has gotten 2 covid shots but that is all he wants. UTD  on flu shot for the year.

## 2024-09-22 ENCOUNTER — Ambulatory Visit (INDEPENDENT_AMBULATORY_CARE_PROVIDER_SITE_OTHER): Payer: Self-pay

## 2024-09-22 DIAGNOSIS — I255 Ischemic cardiomyopathy: Secondary | ICD-10-CM | POA: Diagnosis not present

## 2024-09-24 LAB — CUP PACEART REMOTE DEVICE CHECK
Battery Remaining Longevity: 103 mo
Battery Voltage: 3.01 V
Brady Statistic RV Percent Paced: 0.01 %
Date Time Interrogation Session: 20251226063328
HighPow Impedance: 71 Ohm
Implantable Lead Connection Status: 753985
Implantable Lead Implant Date: 20211228
Implantable Lead Location: 753860
Implantable Pulse Generator Implant Date: 20211228
Lead Channel Impedance Value: 380 Ohm
Lead Channel Impedance Value: 437 Ohm
Lead Channel Pacing Threshold Amplitude: 0.5 V
Lead Channel Pacing Threshold Pulse Width: 0.4 ms
Lead Channel Sensing Intrinsic Amplitude: 20.875 mV
Lead Channel Sensing Intrinsic Amplitude: 20.875 mV
Lead Channel Setting Pacing Amplitude: 2 V
Lead Channel Setting Pacing Pulse Width: 0.4 ms
Lead Channel Setting Sensing Sensitivity: 0.3 mV
Zone Setting Status: 755011
Zone Setting Status: 755011

## 2024-09-25 ENCOUNTER — Ambulatory Visit: Payer: Self-pay | Admitting: Cardiology

## 2024-09-25 NOTE — Progress Notes (Signed)
 Remote ICD Transmission

## 2024-10-13 ENCOUNTER — Other Ambulatory Visit (HOSPITAL_COMMUNITY): Payer: Self-pay | Admitting: Internal Medicine

## 2024-10-30 ENCOUNTER — Other Ambulatory Visit: Payer: Self-pay

## 2024-12-12 ENCOUNTER — Ambulatory Visit: Admitting: Internal Medicine

## 2025-09-18 ENCOUNTER — Ambulatory Visit: Payer: Self-pay
